# Patient Record
Sex: Female | Born: 1945
Health system: Southern US, Community
[De-identification: ages and names within clinical notes are randomized; demographics above are authoritative.]

## PROBLEM LIST (undated history)

## (undated) DIAGNOSIS — C760 Malignant neoplasm of head, face and neck: Secondary | ICD-10-CM

## (undated) DIAGNOSIS — C801 Malignant (primary) neoplasm, unspecified: Secondary | ICD-10-CM

## (undated) DIAGNOSIS — I639 Cerebral infarction, unspecified: Secondary | ICD-10-CM

## (undated) DIAGNOSIS — Z9221 Personal history of antineoplastic chemotherapy: Secondary | ICD-10-CM

## (undated) DIAGNOSIS — I341 Nonrheumatic mitral (valve) prolapse: Secondary | ICD-10-CM

## (undated) DIAGNOSIS — J189 Pneumonia, unspecified organism: Secondary | ICD-10-CM

## (undated) DIAGNOSIS — I890 Lymphedema, not elsewhere classified: Secondary | ICD-10-CM

## (undated) DIAGNOSIS — K219 Gastro-esophageal reflux disease without esophagitis: Secondary | ICD-10-CM

## (undated) DIAGNOSIS — R569 Unspecified convulsions: Secondary | ICD-10-CM

## (undated) DIAGNOSIS — M1990 Unspecified osteoarthritis, unspecified site: Secondary | ICD-10-CM

## (undated) DIAGNOSIS — Z8719 Personal history of other diseases of the digestive system: Secondary | ICD-10-CM

## (undated) DIAGNOSIS — M545 Low back pain, unspecified: Secondary | ICD-10-CM

## (undated) DIAGNOSIS — I10 Essential (primary) hypertension: Secondary | ICD-10-CM

## (undated) DIAGNOSIS — C4492 Squamous cell carcinoma of skin, unspecified: Secondary | ICD-10-CM

## (undated) DIAGNOSIS — J449 Chronic obstructive pulmonary disease, unspecified: Secondary | ICD-10-CM

## (undated) DIAGNOSIS — E7800 Pure hypercholesterolemia, unspecified: Secondary | ICD-10-CM

## (undated) DIAGNOSIS — IMO0001 Reserved for inherently not codable concepts without codable children: Secondary | ICD-10-CM

## (undated) DIAGNOSIS — F419 Anxiety disorder, unspecified: Secondary | ICD-10-CM

## (undated) DIAGNOSIS — Z9289 Personal history of other medical treatment: Secondary | ICD-10-CM

## (undated) DIAGNOSIS — F329 Major depressive disorder, single episode, unspecified: Secondary | ICD-10-CM

## (undated) DIAGNOSIS — G8929 Other chronic pain: Secondary | ICD-10-CM

## (undated) DIAGNOSIS — C569 Malignant neoplasm of unspecified ovary: Secondary | ICD-10-CM

## (undated) DIAGNOSIS — R011 Cardiac murmur, unspecified: Secondary | ICD-10-CM

## (undated) DIAGNOSIS — F32A Depression, unspecified: Secondary | ICD-10-CM

## (undated) HISTORY — PX: BACK SURGERY: SHX140

## (undated) HISTORY — PX: CATARACT EXTRACTION W/ INTRAOCULAR LENS  IMPLANT, BILATERAL: SHX1307

## (undated) HISTORY — PX: APPENDECTOMY: SHX54

## (undated) HISTORY — PX: BASAL CELL CARCINOMA EXCISION: SHX1214

## (undated) HISTORY — PX: FOOT TENDON SURGERY: SHX958

## (undated) HISTORY — PX: TUBAL LIGATION: SHX77

## (undated) HISTORY — PX: SQUAMOUS CELL CARCINOMA EXCISION: SHX2433

## (undated) HISTORY — PX: FRACTURE SURGERY: SHX138

---

## 1977-05-11 HISTORY — PX: VAGINAL HYSTERECTOMY: SUR661

## 2000-07-12 ENCOUNTER — Encounter: Admission: RE | Admit: 2000-07-12 | Discharge: 2000-07-12 | Payer: Self-pay | Admitting: Specialist

## 2000-07-12 ENCOUNTER — Encounter: Payer: Self-pay | Admitting: Specialist

## 2000-07-20 ENCOUNTER — Encounter: Payer: Self-pay | Admitting: Specialist

## 2000-07-20 ENCOUNTER — Encounter: Admission: RE | Admit: 2000-07-20 | Discharge: 2000-07-20 | Payer: Self-pay | Admitting: Specialist

## 2000-09-21 ENCOUNTER — Encounter: Payer: Self-pay | Admitting: Family Medicine

## 2000-09-21 ENCOUNTER — Encounter: Admission: RE | Admit: 2000-09-21 | Discharge: 2000-09-21 | Payer: Self-pay | Admitting: Family Medicine

## 2001-04-08 ENCOUNTER — Encounter: Payer: Self-pay | Admitting: Specialist

## 2001-04-08 ENCOUNTER — Encounter: Admission: RE | Admit: 2001-04-08 | Discharge: 2001-04-08 | Payer: Self-pay | Admitting: Specialist

## 2001-04-11 ENCOUNTER — Encounter: Payer: Self-pay | Admitting: Specialist

## 2001-04-11 ENCOUNTER — Encounter: Admission: RE | Admit: 2001-04-11 | Discharge: 2001-04-11 | Payer: Self-pay | Admitting: Specialist

## 2001-10-13 ENCOUNTER — Encounter: Payer: Self-pay | Admitting: Family Medicine

## 2001-10-13 ENCOUNTER — Encounter: Admission: RE | Admit: 2001-10-13 | Discharge: 2001-10-13 | Payer: Self-pay | Admitting: Family Medicine

## 2002-10-18 ENCOUNTER — Encounter: Payer: Self-pay | Admitting: Obstetrics and Gynecology

## 2002-10-18 ENCOUNTER — Encounter: Admission: RE | Admit: 2002-10-18 | Discharge: 2002-10-18 | Payer: Self-pay | Admitting: Obstetrics and Gynecology

## 2003-02-12 ENCOUNTER — Ambulatory Visit (HOSPITAL_COMMUNITY): Admission: RE | Admit: 2003-02-12 | Discharge: 2003-02-12 | Payer: Self-pay | Admitting: Gastroenterology

## 2003-04-25 ENCOUNTER — Encounter: Admission: RE | Admit: 2003-04-25 | Discharge: 2003-04-25 | Payer: Self-pay | Admitting: Family Medicine

## 2003-11-28 ENCOUNTER — Encounter: Admission: RE | Admit: 2003-11-28 | Discharge: 2003-11-28 | Payer: Self-pay | Admitting: Family Medicine

## 2004-05-11 HISTORY — PX: CARDIAC CATHETERIZATION: SHX172

## 2005-02-11 ENCOUNTER — Encounter: Admission: RE | Admit: 2005-02-11 | Discharge: 2005-02-11 | Payer: Self-pay | Admitting: Obstetrics and Gynecology

## 2005-03-26 ENCOUNTER — Ambulatory Visit (HOSPITAL_COMMUNITY): Admission: RE | Admit: 2005-03-26 | Discharge: 2005-03-26 | Payer: Self-pay | Admitting: Cardiology

## 2005-04-08 ENCOUNTER — Ambulatory Visit (HOSPITAL_COMMUNITY): Admission: RE | Admit: 2005-04-08 | Discharge: 2005-04-08 | Payer: Self-pay | Admitting: Cardiology

## 2005-05-13 ENCOUNTER — Ambulatory Visit (HOSPITAL_COMMUNITY): Admission: RE | Admit: 2005-05-13 | Discharge: 2005-05-13 | Payer: Self-pay | Admitting: Cardiology

## 2005-09-18 ENCOUNTER — Emergency Department (HOSPITAL_COMMUNITY): Admission: EM | Admit: 2005-09-18 | Discharge: 2005-09-19 | Payer: Self-pay | Admitting: Emergency Medicine

## 2006-03-30 ENCOUNTER — Encounter: Admission: RE | Admit: 2006-03-30 | Discharge: 2006-03-30 | Payer: Self-pay | Admitting: Family Medicine

## 2007-05-19 ENCOUNTER — Encounter: Admission: RE | Admit: 2007-05-19 | Discharge: 2007-05-19 | Payer: Self-pay | Admitting: Family Medicine

## 2008-05-11 HISTORY — PX: PORTACATH PLACEMENT: SHX2246

## 2008-05-11 HISTORY — PX: LAPAROTOMY FOR STAGING / RESTAGING: SUR810

## 2008-05-21 ENCOUNTER — Encounter: Admission: RE | Admit: 2008-05-21 | Discharge: 2008-05-21 | Payer: Self-pay | Admitting: Family Medicine

## 2008-06-08 ENCOUNTER — Encounter: Admission: RE | Admit: 2008-06-08 | Discharge: 2008-06-08 | Payer: Self-pay | Admitting: Family Medicine

## 2009-05-11 DIAGNOSIS — C569 Malignant neoplasm of unspecified ovary: Secondary | ICD-10-CM

## 2009-05-11 HISTORY — DX: Malignant neoplasm of unspecified ovary: C56.9

## 2009-05-28 ENCOUNTER — Encounter: Admission: RE | Admit: 2009-05-28 | Discharge: 2009-05-28 | Payer: Self-pay | Admitting: Family Medicine

## 2009-07-23 ENCOUNTER — Encounter: Admission: RE | Admit: 2009-07-23 | Discharge: 2009-07-23 | Payer: Self-pay | Admitting: Family Medicine

## 2009-08-03 ENCOUNTER — Emergency Department (HOSPITAL_COMMUNITY): Admission: EM | Admit: 2009-08-03 | Discharge: 2009-08-03 | Payer: Self-pay | Admitting: Emergency Medicine

## 2010-05-11 HISTORY — PX: ORIF SHOULDER FRACTURE: SHX5035

## 2010-06-23 ENCOUNTER — Other Ambulatory Visit: Payer: Self-pay | Admitting: Family Medicine

## 2010-06-23 DIAGNOSIS — Z1231 Encounter for screening mammogram for malignant neoplasm of breast: Secondary | ICD-10-CM

## 2010-07-10 ENCOUNTER — Emergency Department (HOSPITAL_COMMUNITY): Payer: 59

## 2010-07-10 ENCOUNTER — Observation Stay (HOSPITAL_COMMUNITY)
Admission: EM | Admit: 2010-07-10 | Discharge: 2010-07-11 | Disposition: A | Discharge status: Home / Self Care | Payer: 59 | Attending: Orthopedic Surgery | Admitting: Orthopedic Surgery

## 2010-07-10 DIAGNOSIS — C569 Malignant neoplasm of unspecified ovary: Secondary | ICD-10-CM | POA: Insufficient documentation

## 2010-07-10 DIAGNOSIS — S42213A Unspecified displaced fracture of surgical neck of unspecified humerus, initial encounter for closed fracture: Principal | ICD-10-CM | POA: Insufficient documentation

## 2010-07-10 DIAGNOSIS — Y9301 Activity, walking, marching and hiking: Secondary | ICD-10-CM | POA: Insufficient documentation

## 2010-07-10 DIAGNOSIS — W010XXA Fall on same level from slipping, tripping and stumbling without subsequent striking against object, initial encounter: Secondary | ICD-10-CM | POA: Insufficient documentation

## 2010-07-10 DIAGNOSIS — I890 Lymphedema, not elsewhere classified: Secondary | ICD-10-CM | POA: Insufficient documentation

## 2010-07-10 DIAGNOSIS — Z79899 Other long term (current) drug therapy: Secondary | ICD-10-CM | POA: Insufficient documentation

## 2010-07-10 DIAGNOSIS — Y92009 Unspecified place in unspecified non-institutional (private) residence as the place of occurrence of the external cause: Secondary | ICD-10-CM | POA: Insufficient documentation

## 2010-07-10 LAB — CBC
HCT: 33.4 % — ABNORMAL LOW (ref 36.0–46.0)
MCH: 26.4 pg (ref 26.0–34.0)
MCHC: 31.7 g/dL (ref 30.0–36.0)
MCV: 83.1 fL (ref 78.0–100.0)
RBC: 4.02 MIL/uL (ref 3.87–5.11)
WBC: 6.3 10*3/uL (ref 4.0–10.5)

## 2010-07-10 LAB — DIFFERENTIAL
Eosinophils Relative: 1 % (ref 0–5)
Lymphs Abs: 2.3 10*3/uL (ref 0.7–4.0)
Monocytes Absolute: 0.6 10*3/uL (ref 0.1–1.0)
Monocytes Relative: 10 % (ref 3–12)
Neutrophils Relative %: 52 % (ref 43–77)

## 2010-07-10 LAB — COMPREHENSIVE METABOLIC PANEL
AST: 18 U/L (ref 0–37)
Albumin: 3.1 g/dL — ABNORMAL LOW (ref 3.5–5.2)
Calcium: 8.4 mg/dL (ref 8.4–10.5)
Chloride: 108 mEq/L (ref 96–112)
Creatinine, Ser: 0.83 mg/dL (ref 0.4–1.2)
GFR calc Af Amer: >60 mL/min (ref >60)
Glucose, Bld: 112 mg/dL — ABNORMAL HIGH (ref 70–99)
Sodium: 140 mEq/L (ref 135–145)
Total Bilirubin: 0.6 mg/dL (ref 0.3–1.2)
Total Protein: 6.6 g/dL (ref 6.0–8.3)

## 2010-07-10 LAB — URINALYSIS, ROUTINE W REFLEX MICROSCOPIC
Bilirubin Urine: NEGATIVE
Hgb urine dipstick: NEGATIVE
Specific Gravity, Urine: 1.033 — ABNORMAL HIGH (ref 1.005–1.030)
Urine Glucose, Fasting: NEGATIVE mg/dL
Urobilinogen, UA: 0.2 mg/dL (ref 0.0–1.0)

## 2010-07-10 LAB — PROTIME-INR: INR: 1.02 (ref 0.00–1.49)

## 2010-07-11 LAB — URINE CULTURE: Colony Count: 9000

## 2010-07-14 NOTE — H&P (Signed)
  NAME:  Price, Caitlyn              ACCOUNT NO.:  [id number]  MEDICAL RECORD NO.:  [id number]           PATIENT TYPE:  I  LOCATION:  1605                         FACILITY:  [facility]  PHYSICIAN:  Alvy Beal, MD    DATE OF BIRTH:  August 20, 1945  DATE OF ADMISSION:  07/10/2010 DATE OF DISCHARGE:                             HISTORY & PHYSICAL   ADMISSION DIAGNOSIS:  Right proximal four-part intertrochanteric fracture.  HISTORY:  This is a very pleasant 65 year old woman with an underlying history of ovarian cancer, who was still undergoing treatment with chemotherapy.  She has significant lymphedema secondary to ovarian cancer in the left lower extremity and she has been falling recently. The patient says she with her dog, tripped and fell, and landed on her right shoulder.  She is well-known to my practice and so I was contacted for consultation.  The patient states that initially she had significant numbness in her hand, but that is resolved and she still has a lot of pain in the right shoulder.  No other complaints.  She denies a loss of consciousness, headaches, blurry vision, or nausea.  Her past medical, surgical, family, social history includes ovarian cancer, mitral valve prolapse.  She is a nondrug abuser, nonsmoker, nondrinker.  No significant medical history.  She is on Cymbalta, vitamin B12, aspirin, lorazepam, Ambien, Avelox.  She is allergic to COMPAZINE, PENICILLIN, PHENERGAN, SULFA.  CLINICAL EXAM:  She is a pleasant woman who appears stated age in no acute distress.  She is alert, oriented x3.  No shortness of breath or chest pain.  Abdomen is soft and nontender.  Port-a-Cath site is clean, dry, and intact.  No real hip, knee, or ankle pain with joint range of motion.  Right upper extremity has significant shoulder pain with palpation.  No obvious laceration or contusion or bleeding.  Posterior interosseous nerve, median and ulnar nerve functions are all  intact. She has intact sensation to light touch in the axillary nerve distribution.  X-rays demonstrated four-part comminuted proximal humerus fracture with displacement of the greater troch.  At this point in time, I have spoken with the patient and because of severe pain, I will admit her to the hospital for pain control.  We will use PCA Dilaudid tonight and then transition her to oral narcotics in the morning.  I will contact my partner for definitive fracture management.  We will put her in a shoulder immobilizer for now and also get a CT scan for better fracture evaluation.    Alvy Beal, MD    DDB/MEDQ  D:  07/10/2010  T:  07/11/2010  Job:  [id number]  Electronically Signed by Venita Lick MD on 07/14/2010 05:21:24 PM

## 2010-07-17 ENCOUNTER — Inpatient Hospital Stay (HOSPITAL_COMMUNITY)
Admission: RE | Admit: 2010-07-17 | Discharge: 2010-07-21 | DRG: 494 | Disposition: A | Discharge status: Home / Self Care | Payer: 59 | Source: Ambulatory Visit | Attending: Orthopedic Surgery | Admitting: Orthopedic Surgery

## 2010-07-17 ENCOUNTER — Encounter (HOSPITAL_COMMUNITY)
Admission: RE | Admit: 2010-07-17 | Discharge: 2010-07-17 | Disposition: A | Discharge status: Home / Self Care | Payer: 59 | Source: Ambulatory Visit | Attending: Orthopedic Surgery | Admitting: Orthopedic Surgery

## 2010-07-17 ENCOUNTER — Other Ambulatory Visit (HOSPITAL_COMMUNITY): Payer: 59

## 2010-07-17 ENCOUNTER — Other Ambulatory Visit (HOSPITAL_COMMUNITY): Payer: Self-pay | Admitting: Orthopedic Surgery

## 2010-07-17 ENCOUNTER — Ambulatory Visit: Payer: Self-pay

## 2010-07-17 DIAGNOSIS — Y998 Other external cause status: Secondary | ICD-10-CM

## 2010-07-17 DIAGNOSIS — R296 Repeated falls: Secondary | ICD-10-CM | POA: Diagnosis present

## 2010-07-17 DIAGNOSIS — S42253A Displaced fracture of greater tuberosity of unspecified humerus, initial encounter for closed fracture: Principal | ICD-10-CM | POA: Diagnosis present

## 2010-07-17 DIAGNOSIS — S4291XA Fracture of right shoulder girdle, part unspecified, initial encounter for closed fracture: Secondary | ICD-10-CM

## 2010-07-17 LAB — BASIC METABOLIC PANEL
BUN: 8 mg/dL (ref 6–23)
GFR calc Af Amer: >60 mL/min (ref >60)
GFR calc non Af Amer: >60 mL/min (ref >60)
Glucose, Bld: 97 mg/dL (ref 70–99)
Potassium: 4 mEq/L (ref 3.5–5.1)
Sodium: 136 mEq/L (ref 135–145)

## 2010-07-17 LAB — DIFFERENTIAL
Basophils Absolute: 0 10*3/uL (ref 0.0–0.1)
Basophils Relative: 1 % (ref 0–1)
Eosinophils Absolute: 0.1 10*3/uL (ref 0.0–0.7)
Eosinophils Relative: 2 % (ref 0–5)
Lymphs Abs: 2.3 10*3/uL (ref 0.7–4.0)
Monocytes Absolute: 0.7 10*3/uL (ref 0.1–1.0)
Monocytes Relative: 11 % (ref 3–12)
Neutro Abs: 3 10*3/uL (ref 1.7–7.7)

## 2010-07-17 LAB — URINALYSIS, ROUTINE W REFLEX MICROSCOPIC
Glucose, UA: NEGATIVE mg/dL
Hgb urine dipstick: NEGATIVE
Nitrite: NEGATIVE
Protein, ur: NEGATIVE mg/dL
Specific Gravity, Urine: 1.018 (ref 1.005–1.030)
pH: 5.5 (ref 5.0–8.0)

## 2010-07-17 LAB — CBC
Hemoglobin: 9.8 g/dL — ABNORMAL LOW (ref 12.0–15.0)
Platelets: 368 10*3/uL (ref 150–400)
RBC: 3.81 MIL/uL — ABNORMAL LOW (ref 3.87–5.11)
WBC: 6.1 10*3/uL (ref 4.0–10.5)

## 2010-07-17 LAB — SURGICAL PCR SCREEN: Staphylococcus aureus: NEGATIVE

## 2010-07-17 LAB — APTT: aPTT: 28 seconds (ref 24–37)

## 2010-07-19 LAB — CBC
Hemoglobin: 8.8 g/dL — ABNORMAL LOW (ref 12.0–15.0)
MCH: 26.3 pg (ref 26.0–34.0)
MCHC: 32.7 g/dL (ref 30.0–36.0)
MCV: 80.5 fL (ref 78.0–100.0)
RBC: 3.34 MIL/uL — ABNORMAL LOW (ref 3.87–5.11)

## 2010-07-19 LAB — BASIC METABOLIC PANEL
BUN: 12 mg/dL (ref 6–23)
CO2: 28 mEq/L (ref 19–32)
Calcium: 8.6 mg/dL (ref 8.4–10.5)
Chloride: 103 mEq/L (ref 96–112)
Creatinine, Ser: 0.76 mg/dL (ref 0.4–1.2)
GFR calc Af Amer: >60 mL/min (ref >60)
Glucose, Bld: 138 mg/dL — ABNORMAL HIGH (ref 70–99)

## 2010-07-20 LAB — CBC
MCH: 25.9 pg — ABNORMAL LOW (ref 26.0–34.0)
MCHC: 31.7 g/dL (ref 30.0–36.0)
MCV: 81.6 fL (ref 78.0–100.0)
Platelets: 348 10*3/uL (ref 150–400)
RBC: 3.09 MIL/uL — ABNORMAL LOW (ref 3.87–5.11)
RDW: 18.2 % — ABNORMAL HIGH (ref 11.5–15.5)

## 2010-07-20 LAB — BASIC METABOLIC PANEL
BUN: 10 mg/dL (ref 6–23)
Calcium: 8.4 mg/dL (ref 8.4–10.5)
Chloride: 104 mEq/L (ref 96–112)
Creatinine, Ser: 0.79 mg/dL (ref 0.4–1.2)
GFR calc Af Amer: >60 mL/min (ref >60)
GFR calc non Af Amer: >60 mL/min (ref >60)

## 2010-07-26 NOTE — Op Note (Signed)
NAME:  [name], [name]              ACCOUNT NO.:  [id number]  MEDICAL RECORD NO.:  [id number]           PATIENT TYPE:  LOCATION:                                 FACILITY:  PHYSICIAN:  Almedia Balls. Ranell Patrick, M.D. DATE OF BIRTH:  [birth year]  DATE OF PROCEDURE:  07/18/2010 DATE OF DISCHARGE:                              OPERATIVE REPORT   PREOPERATIVE DIAGNOSIS:  Right displaced proximal humerus fracture.  POSTOPERATIVE DIAGNOSIS:  Right displaced proximal humerus fracture.  PROCEDURE PERFORMED:  ORIF right proximal humerus fracture with repair of greater tuberosity.  ATTENDING SURGEON:  Almedia Balls. Ranell Patrick, MD  ASSISTANT:  Modesto Charon, [place].  ANESTHESIA:  General anesthesia was used plus interscalene block.  ESTIMATED BLOOD LOSS:  Minimal.  FLUID REPLACEMENT:  1200 mL crystalloid.  INSTRUMENT COUNTS:  Correct.  COMPLICATIONS:  None.  Perioperative antibiotics were given.  INDICATIONS:  The patient is a 65 year old female with a fall and injury to her right proximal humerus.  The patient sustained a displaced greater tuberosity fracture with a valgus impacted humeral head fracture.  The patient had grossly distorted anatomy in proximal humerus and with a displaced nerve greater tuberosity, was felt to be an operative candidate.  I discussed this in detail with the patient and her husband regarding the recommendation for surgical management to restore the integrity of her greater tuberosity/rotator cuff and also to realign and stabilize her humeral head.  Informed consent was obtained.  DESCRIPTION OF PROCEDURE:  After adequate level of anesthesia achieved, the patient was positioned in modified beach-chair position.  Right shoulder was sterilely prepped and draped in usual manner.  We approached the shoulder through a deltopectoral incision starting at the coracoid process extending down to the anterior humeral shaft. Dissection down through subcutaneous tissues.  The  cephalic vein was identified and taken laterally to the deltoid and pectoralis was taken medially.  Immediately encountered the fractured humerus.  The greater tuberosity was displaced significantly posteriorly with abduction to relax the deltoid.  I was able to identify and grab that greater tuberosity and its attached rotator cuff.  We placed #2 FiberWire suture lateral to the greater tuberosity into the substance of the rotator cuff tendon.  Once we had three of those horizontal mattress sutures with good purchase in the tendon just adjacent to the greater tuberosity and we could mobilize that back into position, we went ahead and used a rongeur to remove a little bit bone from the lateral humeral shaft in preparation for the DePuy SNP nail plate.  We placed that nail plate into position.  We reduced the humeral head regaining the humeral inclination and that was done with a Cobb elevator and then I was able to go and fire guide pin into the center of the humeral head.  This was verified with multiplanar C-arm which was draped into the field.  We then went ahead and placed our non threaded pegs which were locked into the SNP plate and we had a total of 5 pegs with good purchase and good alignment.  We were happy in all planes with the reduction and the alignment and placement  of the pins.  Once this was secure, we had a nice stable construct.  We then went ahead and placed 2 distal unicortical screws in the SNP plate without complication.  The final construct was extremely stable.  We then brought the greater tuberosity back into anatomic alignment.  There was a little bit of the tear up into the rotator cuff which we repaired side-to-side using #2 FiberWire suture and that was right where the greater tuberosity had fractured away.  It was almost like that cuff was starting to peel away from the rotator interval area, so we oversewed that rotator interval area and then anatomically  placed that greater tuberosity.  We used our sutures from the rotator cuff and then sewed those over the top to the subscapularis medial to lesser tuberosity.  We had nice solid soft tissue repair, ranges shoulder fully with no impingement and got some final x-rays showing actually over reduction of the greater tuberosity but still was acceptable and everything moved together in stable unit. At this point, we thoroughly irrigated the subdeltoid interval.  We closed the deltopectoral interval with 0 Vicryl suture followed by 2-0 Vicryl and subcutaneous closure and 4-0 Monocryl skin.  Steri-Strips applied followed by a sterile dressing.  The patient tolerated surgery well.     Almedia Balls. Ranell Patrick, M.D.     SRN/MEDQ  D:  07/18/2010  T:  07/19/2010  Job:  [id number]  Electronically Signed by Malon Kindle  on 07/26/2010 11:22:50 AM

## 2010-08-04 LAB — CBC
Hemoglobin: 9.8 g/dL — ABNORMAL LOW (ref 12.0–15.0)
MCHC: 32.1 g/dL (ref 30.0–36.0)
RBC: 3.53 MIL/uL — ABNORMAL LOW (ref 3.87–5.11)
RDW: 14.3 % (ref 11.5–15.5)

## 2010-08-04 LAB — DIFFERENTIAL
Basophils Absolute: 0 10*3/uL (ref 0.0–0.1)
Basophils Relative: 1 % (ref 0–1)
Lymphocytes Relative: 26 % (ref 12–46)
Monocytes Relative: 8 % (ref 3–12)
Neutro Abs: 3.8 10*3/uL (ref 1.7–7.7)
Neutrophils Relative %: 64 % (ref 43–77)

## 2010-08-04 LAB — BASIC METABOLIC PANEL
CO2: 30 mEq/L (ref 19–32)
Calcium: 8.9 mg/dL (ref 8.4–10.5)
Creatinine, Ser: 0.84 mg/dL (ref 0.4–1.2)
GFR calc Af Amer: >60 mL/min (ref >60)
GFR calc non Af Amer: >60 mL/min (ref >60)
Sodium: 137 mEq/L (ref 135–145)

## 2010-08-12 NOTE — Discharge Summary (Signed)
  NAME:  [name], [name]              ACCOUNT NO.:  [id number]  MEDICAL RECORD NO.:  [id number]           PATIENT TYPE:  I  LOCATION:  1605                         FACILITY:  [facility]  PHYSICIAN:  Alvy Beal, MD    DATE OF BIRTH:  [birth year]  DATE OF ADMISSION:  07/10/2010 DATE OF DISCHARGE:  07/11/2010                              DISCHARGE SUMMARY   ADMITTING DIAGNOSIS:  Right four-part proximal humerus fracture.  HISTORY:  This is a 65 year old female who fell and was brought to the emergency room.  The patient states that she was walking her dog, tripped and fell, and landed on her right shoulder.  As a result, she had significant pain and difficulty moving.  The patient was brought to the emergency room.  X-rays were done which demonstrated a comminuted four-part proximal humerus fracture.  As a result, Orthopedic consultation was requested.  Please refer to the dictated H and P for specifics on her past medical, surgical, family, and social history.  The patient was admitted and placed on the shoulder immobilizer.  She was admitted for pain control.  A CT scan was done to get better evaluation of the shoulder.  There was no dislocation.  At this point in time, the decision was made since her pain was improving and she was neurovascularly intact, to discharge her home.  She will follow up with my partner, Dr. Ranell Patrick, for definitive fracture management of the right proximal humerus fracture.  Appropriate instructions and pain medications were provided to the patient prior to being discharged.  FINAL DIAGNOSIS:  Four-part proximal humerus fracture, right side.     Alvy Beal, MD     DDB/MEDQ  D:  08/04/2010  T:  08/05/2010  Job:  [id number]  Electronically Signed by Venita Lick MD on 08/12/2010 08:42:02 AM

## 2010-08-13 NOTE — Discharge Summary (Signed)
  NAME:  [name], [name]              ACCOUNT NO.:  [id number]  MEDICAL RECORD NO.:  [id number]           PATIENT TYPE:  I  LOCATION:  5009                         FACILITY:  [facility]  PHYSICIAN:  Almedia Balls. Ranell Patrick, M.D. DATE OF BIRTH:  April 18, 1946  DATE OF ADMISSION:  07/18/2010 DATE OF DISCHARGE:  07/21/2010                              DISCHARGE SUMMARY   ADMISSION DIAGNOSIS:  Right proximal humerus fracture with displacement.  DISCHARGE DIAGNOSIS:  Right proximal humerus fracture status post open reduction and internal fixation.  BRIEF HISTORY:  The patient is a 65 year old female who sustained a ground-level fall, injuring her right shoulder, proximal humerus.  The patient elected for surgery to decrease pain and increase function of the right upper extremity.  PROCEDURE:  The patient had a right proximal humerus ORIF by Dr. Malon Kindle on July 18, 2010.  ASSISTANT:  Donnie Coffin. Dixon, PA  General anesthesia was used.  No complications.  HOSPITAL COURSE:  The patient underwent, on July 18, 2010, the above- stated procedure which she tolerated well.  After adequate time in postanesthesia care unit, she was transferred to 5000 on postop day #1 and #2, and she complained of severe pain to that right shoulder.  She continued to remain on Percocet and Dilaudid for pain control. Neurologically, she is intact.  Wound was healing well.  She was feeling much better on postop day #3, hence the patient discharged home with aid of family support.  Labs were in acceptable limits.  Neurologically, she was intact.  Wound was healing well.  DISCHARGE PLAN:  The patient will be discharged home on July 21, 2010. Condition is stable.  Her diet is regular.  The patient has allergies to PENICILLIN, SULFA, COMPAZINE, MORPHINE, PHENERGAN, and COLACE.  DISCHARGE MEDICATIONS: 1. Aspirin 81 mg daily. 2. Cymbalta 60 mg daily. 3. Robaxin 500 mg p.o. q.6 hours. 4. Protonix 40 mg daily. 5.  Percocet 5/325, 1-2 tabs every 4-6 hours p.r.n. pain. 6. Dilaudid 10 mg p.o. q.4 hours p.r.n. breakthrough pain.  FOLLOWUP:  The patient to follow back up with Dr. Malon Kindle in 2 weeks.     Thomas B. Dixon, P.A.   ______________________________ Almedia Balls. Ranell Patrick, M.D.    TBD/MEDQ  D:  07/21/2010  T:  07/22/2010  Job:  [id number]  Electronically Signed by Standley Dakins P.A. on 08/05/2010 08:15:06 AM Electronically Signed by Malon Kindle  on 08/13/2010 08:07:44 PM

## 2010-09-26 NOTE — Cardiovascular Report (Signed)
NAME:  [name], [name]              ACCOUNT NO.:  [id number]   MEDICAL RECORD NO.:  [id number]          PATIENT TYPE:  OIB   LOCATION:  2855                         FACILITY:  [facility]   PHYSICIAN:  Armanda Magic, M.D.     DATE OF BIRTH:  Aug 05, 1945   DATE OF PROCEDURE:  03/26/2005  DATE OF DISCHARGE:  03/26/2005                              CARDIAC CATHETERIZATION   REFERRING PHYSICIAN:  Dr. Holley Bouche.   PROCEDURE:  Left heart catheterization, coronary angiography, left  ventriculography.   OPERATOR:  Armanda Magic, M.D.   INDICATIONS:  Chest pain and shortness of breath.   COMPLICATIONS:  None.   IV CONTRAST USED:  70 cc.   IV MEDICATIONS:  Versed 1 milligram IV.   This is a very pleasant 65 year old white female, with a history of  dyslipidemia, mitral prolapse and depression, who presented with new onset  of exertional shortness of breath as well as exertional fatigue and chest  pain. She now presents for cardiac catheterization.   The patient is brought to the cardiac catheterization laboratory in the  fasting nonsedated state. Informed consent was obtained. The patient was  connected to continuous heart rate, pulse oximetry monitoring and  intermittent blood pressure monitoring. The right groin was prepped and  draped in a sterile fashion. 1% Xylocaine was used for local anesthesia.  Using a modified Seldinger technique, a 6-French sheath was placed in the  right femoral artery. Under fluoroscopic guidance, a 6-French JL-4 catheter  was placed in the left coronary artery. Multiple cine films were taken at 33  degree RAO and 40 degree LAO views. This catheter was then exchanged out  over a guidewire for 6-French JR-4 catheter which was placed under  fluoroscopic guidance in the right coronary artery. Multiple cine films were  taken at 30 degree RAO and 40 degree LAO views. This catheter was then  exchanged out over guidewire for 6-French angled pigtail catheter which  was  placed under fluoroscopic guidance in the left ventricular cavity. Left  ventriculography was performed in the 30 degree RAO view using a total of 30  cc contrast at 15 cc per second. Catheter was then pulled back across the  aortic valve with no significant gradient noted. At the end of the  procedure, all catheters and sheaths were removed. Manual compression was  performed until adequate hemostasis was obtained. The patient was  transferred back to the room in stable condition.   RESULTS:  Left main coronary artery is widely patent and bifurcates into the  left anterior descending artery and left circumflex artery. Left anterior  descending artery is widely patent throughout its course giving rise to  three diagonal branches all of which are widely patent.   Left circumflex is widely patent throughout its course giving rise to one  very large obtuse marginal branch which is widely patent. The ongoing  circumflex traverses the AV groove and is widely patent.   The right coronary artery is widely patent and bifurcates distally in the  posterior descending artery and the posterior lateral artery, both of which  are widely patent.  Left ventriculography shows normal LV systolic function EF of 50%. LV  pressure 122/10 mm. Aortic pressure 123/58 mmHg, LVEDP 10-13 mmHg.   ASSESSMENT:  1.  Noncardiac chest pain and shortness of breath.  2.  Normal left ventricular function with normal left ventricular end-      diastolic pressure.  3.  Normal coronary arteries.  4.  Biapical and left lower lobe lung scarring on chest x-ray.  5.  Dyslipidemia.   PLAN:  1.  Discharge home after IV fluid and bed rest. Outpatient chest CT for      workup of shortness of breath and rule out PE as well as evaluate      further the lung scarring.  2.  Check PFTs with [facility].  3.  Follow up with me in one to weeks.      Armanda Magic, M.D.  Electronically Signed     TT/MEDQ  D:  03/26/2005  T:   03/27/2005  Job:  [id number]   cc:   Holley Bouche, M.D.  Fax: [phone]

## 2010-09-26 NOTE — Op Note (Signed)
   NAME:  [name], Caitlyn B                        ACCOUNT NO.:  [id number]   MEDICAL RECORD NO.:  [id number]                   PATIENT TYPE:  AMB   LOCATION:  ENDO                                 [facility]ACILITY[facility]   PHYSICIAN:  Danise Edge, M.D.                DATE OF BIRTH:  June 29, 1945   DATE OF PROCEDURE:  02/12/2003  DATE OF DISCHARGE:                                 OPERATIVE REPORT   PROCEDURE:  Screening colonoscopy.   REFERRING PHYSICIANS:  Artist Pais, M.D., and Holley Bouche, M.D.   PROCEDURE INDICATION:  [name] is a 65 year old female born November 06, 1945.  Ms. Stipes is scheduled to undergo her first screening colonoscopy  with polypectomy to prevent colon cancer.   ENDOSCOPIST:  Danise Edge, M.D.   PREMEDICATION:  Versed 7 mg, Demerol 50 mg.   DESCRIPTION OF PROCEDURE:  After obtaining informed consent, [name] was  placed in the left lateral decubitus position.  I administered intravenous  Demerol and intravenous Versed to achieve conscious sedation for the  procedure.  The patient's blood pressure, oxygen saturation, and cardiac  rhythm were monitored throughout the procedure and documented in the medical  record.   Anal inspection was normal.  Digital rectal exam was normal.  The Olympus  pediatric colonoscope was introduced into the rectum and easily advanced to  the cecum.  Colonic preparation for the exam today was excellent.   Rectum normal.   Sigmoid colon and descending colon normal.   Splenic flexure normal.   Transverse colon normal.   Hepatic flexure normal.   Ascending colon normal.   Cecum and ileocecal valve normal.    ASSESSMENT:  1. Normal screening proctocolonoscopy to the cecum.  No endoscopic evidence     for the presence of colorectal neoplasia.  2. Melanosis coli present.                                               Danise Edge, M.D.    MJ/MEDQ  D:  02/12/2003  T:  02/12/2003  Job:  [id number]   cc:    Artist Pais, M.D.  [address], [address]  [place]  [place] [address]  Fax: [phone]   Holley Bouche, M.D.  [address]  [place], [place] [address]  Fax: [phone]

## 2010-10-24 ENCOUNTER — Ambulatory Visit
Admission: RE | Admit: 2010-10-24 | Discharge: 2010-10-24 | Disposition: A | Discharge status: Home / Self Care | Payer: 59 | Source: Ambulatory Visit | Attending: Family Medicine | Admitting: Family Medicine

## 2010-10-24 DIAGNOSIS — Z1231 Encounter for screening mammogram for malignant neoplasm of breast: Secondary | ICD-10-CM

## 2011-07-13 ENCOUNTER — Other Ambulatory Visit: Payer: Self-pay | Admitting: Gastroenterology

## 2011-07-13 DIAGNOSIS — R1011 Right upper quadrant pain: Secondary | ICD-10-CM

## 2011-07-15 ENCOUNTER — Other Ambulatory Visit: Payer: 59

## 2011-07-21 ENCOUNTER — Ambulatory Visit
Admission: RE | Admit: 2011-07-21 | Discharge: 2011-07-21 | Disposition: A | Discharge status: Home / Self Care | Payer: 59 | Source: Ambulatory Visit | Attending: Gastroenterology | Admitting: Gastroenterology

## 2011-07-21 DIAGNOSIS — R1011 Right upper quadrant pain: Secondary | ICD-10-CM

## 2011-09-15 ENCOUNTER — Other Ambulatory Visit: Payer: Self-pay | Admitting: Family Medicine

## 2011-09-15 DIAGNOSIS — Z1231 Encounter for screening mammogram for malignant neoplasm of breast: Secondary | ICD-10-CM

## 2011-10-13 ENCOUNTER — Other Ambulatory Visit: Payer: Self-pay | Admitting: Family Medicine

## 2011-10-13 ENCOUNTER — Ambulatory Visit
Admission: RE | Admit: 2011-10-13 | Discharge: 2011-10-13 | Disposition: A | Discharge status: Home / Self Care | Payer: 59 | Source: Ambulatory Visit | Attending: Family Medicine | Admitting: Family Medicine

## 2011-10-13 DIAGNOSIS — I890 Lymphedema, not elsewhere classified: Secondary | ICD-10-CM

## 2011-10-26 ENCOUNTER — Ambulatory Visit: Payer: 59

## 2011-10-28 ENCOUNTER — Ambulatory Visit
Admission: RE | Admit: 2011-10-28 | Discharge: 2011-10-28 | Disposition: A | Discharge status: Home / Self Care | Payer: 59 | Source: Ambulatory Visit | Attending: Family Medicine | Admitting: Family Medicine

## 2011-10-28 DIAGNOSIS — Z1231 Encounter for screening mammogram for malignant neoplasm of breast: Secondary | ICD-10-CM

## 2012-07-19 DIAGNOSIS — C569 Malignant neoplasm of unspecified ovary: Secondary | ICD-10-CM | POA: Diagnosis not present

## 2012-07-22 DIAGNOSIS — G479 Sleep disorder, unspecified: Secondary | ICD-10-CM | POA: Diagnosis not present

## 2012-07-22 DIAGNOSIS — L0291 Cutaneous abscess, unspecified: Secondary | ICD-10-CM | POA: Diagnosis not present

## 2012-08-03 DIAGNOSIS — R599 Enlarged lymph nodes, unspecified: Secondary | ICD-10-CM | POA: Diagnosis not present

## 2012-08-19 DIAGNOSIS — C569 Malignant neoplasm of unspecified ovary: Secondary | ICD-10-CM | POA: Diagnosis not present

## 2012-08-19 DIAGNOSIS — B009 Herpesviral infection, unspecified: Secondary | ICD-10-CM | POA: Diagnosis not present

## 2012-08-19 DIAGNOSIS — I890 Lymphedema, not elsewhere classified: Secondary | ICD-10-CM | POA: Diagnosis not present

## 2012-08-19 DIAGNOSIS — R599 Enlarged lymph nodes, unspecified: Secondary | ICD-10-CM | POA: Diagnosis not present

## 2012-08-29 DIAGNOSIS — R918 Other nonspecific abnormal finding of lung field: Secondary | ICD-10-CM | POA: Diagnosis not present

## 2012-08-29 DIAGNOSIS — R609 Edema, unspecified: Secondary | ICD-10-CM | POA: Diagnosis not present

## 2012-08-29 DIAGNOSIS — K7689 Other specified diseases of liver: Secondary | ICD-10-CM | POA: Diagnosis not present

## 2012-08-29 DIAGNOSIS — J438 Other emphysema: Secondary | ICD-10-CM | POA: Diagnosis not present

## 2012-08-29 DIAGNOSIS — C569 Malignant neoplasm of unspecified ovary: Secondary | ICD-10-CM | POA: Diagnosis not present

## 2012-08-29 DIAGNOSIS — R599 Enlarged lymph nodes, unspecified: Secondary | ICD-10-CM | POA: Diagnosis not present

## 2012-08-31 DIAGNOSIS — Z9221 Personal history of antineoplastic chemotherapy: Secondary | ICD-10-CM | POA: Diagnosis not present

## 2012-08-31 DIAGNOSIS — I890 Lymphedema, not elsewhere classified: Secondary | ICD-10-CM | POA: Diagnosis not present

## 2012-08-31 DIAGNOSIS — Z90710 Acquired absence of both cervix and uterus: Secondary | ICD-10-CM | POA: Diagnosis not present

## 2012-08-31 DIAGNOSIS — Z79810 Long term (current) use of selective estrogen receptor modulators (SERMs): Secondary | ICD-10-CM | POA: Diagnosis not present

## 2012-08-31 DIAGNOSIS — Z9079 Acquired absence of other genital organ(s): Secondary | ICD-10-CM | POA: Diagnosis not present

## 2012-08-31 DIAGNOSIS — Z8543 Personal history of malignant neoplasm of ovary: Secondary | ICD-10-CM | POA: Diagnosis not present

## 2012-08-31 DIAGNOSIS — Z09 Encounter for follow-up examination after completed treatment for conditions other than malignant neoplasm: Secondary | ICD-10-CM | POA: Diagnosis not present

## 2012-10-17 DIAGNOSIS — Z452 Encounter for adjustment and management of vascular access device: Secondary | ICD-10-CM | POA: Diagnosis not present

## 2012-10-17 DIAGNOSIS — Z9079 Acquired absence of other genital organ(s): Secondary | ICD-10-CM | POA: Diagnosis not present

## 2012-10-17 DIAGNOSIS — I890 Lymphedema, not elsewhere classified: Secondary | ICD-10-CM | POA: Diagnosis not present

## 2012-10-17 DIAGNOSIS — Z7982 Long term (current) use of aspirin: Secondary | ICD-10-CM | POA: Diagnosis not present

## 2012-10-17 DIAGNOSIS — C569 Malignant neoplasm of unspecified ovary: Secondary | ICD-10-CM | POA: Diagnosis not present

## 2012-10-17 DIAGNOSIS — Z79899 Other long term (current) drug therapy: Secondary | ICD-10-CM | POA: Diagnosis not present

## 2012-10-17 DIAGNOSIS — Z9221 Personal history of antineoplastic chemotherapy: Secondary | ICD-10-CM | POA: Diagnosis not present

## 2012-10-17 DIAGNOSIS — Z90710 Acquired absence of both cervix and uterus: Secondary | ICD-10-CM | POA: Diagnosis not present

## 2012-10-26 DIAGNOSIS — Z23 Encounter for immunization: Secondary | ICD-10-CM | POA: Diagnosis not present

## 2012-10-26 DIAGNOSIS — F329 Major depressive disorder, single episode, unspecified: Secondary | ICD-10-CM | POA: Diagnosis not present

## 2012-10-26 DIAGNOSIS — R599 Enlarged lymph nodes, unspecified: Secondary | ICD-10-CM | POA: Diagnosis not present

## 2012-10-26 DIAGNOSIS — F411 Generalized anxiety disorder: Secondary | ICD-10-CM | POA: Diagnosis not present

## 2012-10-26 DIAGNOSIS — E785 Hyperlipidemia, unspecified: Secondary | ICD-10-CM | POA: Diagnosis not present

## 2012-10-26 DIAGNOSIS — G479 Sleep disorder, unspecified: Secondary | ICD-10-CM | POA: Diagnosis not present

## 2012-11-24 DIAGNOSIS — C569 Malignant neoplasm of unspecified ovary: Secondary | ICD-10-CM | POA: Diagnosis not present

## 2012-12-02 DIAGNOSIS — H04129 Dry eye syndrome of unspecified lacrimal gland: Secondary | ICD-10-CM | POA: Diagnosis not present

## 2012-12-02 DIAGNOSIS — H40029 Open angle with borderline findings, high risk, unspecified eye: Secondary | ICD-10-CM | POA: Diagnosis not present

## 2012-12-16 ENCOUNTER — Other Ambulatory Visit: Payer: Self-pay

## 2012-12-16 DIAGNOSIS — Z1231 Encounter for screening mammogram for malignant neoplasm of breast: Secondary | ICD-10-CM

## 2012-12-23 DIAGNOSIS — S52599A Other fractures of lower end of unspecified radius, initial encounter for closed fracture: Secondary | ICD-10-CM | POA: Diagnosis not present

## 2013-01-05 ENCOUNTER — Ambulatory Visit
Admission: RE | Admit: 2013-01-05 | Discharge: 2013-01-05 | Disposition: A | Discharge status: Home / Self Care | Payer: Medicare Other | Source: Ambulatory Visit

## 2013-01-05 DIAGNOSIS — Z1231 Encounter for screening mammogram for malignant neoplasm of breast: Secondary | ICD-10-CM

## 2013-01-12 DIAGNOSIS — S52599A Other fractures of lower end of unspecified radius, initial encounter for closed fracture: Secondary | ICD-10-CM | POA: Diagnosis not present

## 2013-01-23 DIAGNOSIS — Z452 Encounter for adjustment and management of vascular access device: Secondary | ICD-10-CM | POA: Diagnosis not present

## 2013-01-23 DIAGNOSIS — C569 Malignant neoplasm of unspecified ovary: Secondary | ICD-10-CM | POA: Diagnosis not present

## 2013-02-06 DIAGNOSIS — S52599A Other fractures of lower end of unspecified radius, initial encounter for closed fracture: Secondary | ICD-10-CM | POA: Diagnosis not present

## 2013-02-13 ENCOUNTER — Other Ambulatory Visit: Payer: Self-pay | Admitting: Gastroenterology

## 2013-02-22 DIAGNOSIS — S52599A Other fractures of lower end of unspecified radius, initial encounter for closed fracture: Secondary | ICD-10-CM | POA: Diagnosis not present

## 2013-03-01 ENCOUNTER — Encounter (HOSPITAL_COMMUNITY): Payer: Self-pay | Admitting: Pharmacy Technician

## 2013-03-10 ENCOUNTER — Encounter (HOSPITAL_COMMUNITY): Payer: Self-pay | Admitting: *Deleted

## 2013-03-10 DIAGNOSIS — I341 Nonrheumatic mitral (valve) prolapse: Secondary | ICD-10-CM

## 2013-03-10 DIAGNOSIS — I890 Lymphedema, not elsewhere classified: Secondary | ICD-10-CM

## 2013-03-10 HISTORY — DX: Nonrheumatic mitral (valve) prolapse: I34.1

## 2013-03-10 HISTORY — DX: Lymphedema, not elsewhere classified: I89.0

## 2013-03-11 ENCOUNTER — Encounter (HOSPITAL_COMMUNITY): Payer: Self-pay | Admitting: Emergency Medicine

## 2013-03-11 ENCOUNTER — Emergency Department (HOSPITAL_COMMUNITY)
Admission: EM | Admit: 2013-03-11 | Discharge: 2013-03-12 | Disposition: A | Discharge status: Home / Self Care | Payer: Medicare Other | Attending: Emergency Medicine | Admitting: Emergency Medicine

## 2013-03-11 DIAGNOSIS — Z7982 Long term (current) use of aspirin: Secondary | ICD-10-CM | POA: Insufficient documentation

## 2013-03-11 DIAGNOSIS — Z8543 Personal history of malignant neoplasm of ovary: Secondary | ICD-10-CM | POA: Diagnosis not present

## 2013-03-11 DIAGNOSIS — Z880 Allergy status to penicillin: Secondary | ICD-10-CM | POA: Diagnosis not present

## 2013-03-11 DIAGNOSIS — Z79899 Other long term (current) drug therapy: Secondary | ICD-10-CM | POA: Insufficient documentation

## 2013-03-11 DIAGNOSIS — S01501A Unspecified open wound of lip, initial encounter: Secondary | ICD-10-CM | POA: Insufficient documentation

## 2013-03-11 DIAGNOSIS — W1809XA Striking against other object with subsequent fall, initial encounter: Secondary | ICD-10-CM | POA: Insufficient documentation

## 2013-03-11 DIAGNOSIS — Z87891 Personal history of nicotine dependence: Secondary | ICD-10-CM | POA: Diagnosis not present

## 2013-03-11 DIAGNOSIS — F411 Generalized anxiety disorder: Secondary | ICD-10-CM | POA: Insufficient documentation

## 2013-03-11 DIAGNOSIS — S0993XA Unspecified injury of face, initial encounter: Secondary | ICD-10-CM | POA: Insufficient documentation

## 2013-03-11 DIAGNOSIS — Z8781 Personal history of (healed) traumatic fracture: Secondary | ICD-10-CM | POA: Insufficient documentation

## 2013-03-11 DIAGNOSIS — K219 Gastro-esophageal reflux disease without esophagitis: Secondary | ICD-10-CM | POA: Diagnosis not present

## 2013-03-11 DIAGNOSIS — F3289 Other specified depressive episodes: Secondary | ICD-10-CM | POA: Insufficient documentation

## 2013-03-11 DIAGNOSIS — Y92009 Unspecified place in unspecified non-institutional (private) residence as the place of occurrence of the external cause: Secondary | ICD-10-CM | POA: Insufficient documentation

## 2013-03-11 DIAGNOSIS — Y9301 Activity, walking, marching and hiking: Secondary | ICD-10-CM | POA: Insufficient documentation

## 2013-03-11 DIAGNOSIS — F329 Major depressive disorder, single episode, unspecified: Secondary | ICD-10-CM | POA: Insufficient documentation

## 2013-03-11 DIAGNOSIS — S01512A Laceration without foreign body of oral cavity, initial encounter: Secondary | ICD-10-CM

## 2013-03-11 DIAGNOSIS — W19XXXA Unspecified fall, initial encounter: Secondary | ICD-10-CM

## 2013-03-11 DIAGNOSIS — G40909 Epilepsy, unspecified, not intractable, without status epilepticus: Secondary | ICD-10-CM | POA: Diagnosis not present

## 2013-03-11 DIAGNOSIS — Z8679 Personal history of other diseases of the circulatory system: Secondary | ICD-10-CM | POA: Insufficient documentation

## 2013-03-11 DIAGNOSIS — S01502A Unspecified open wound of oral cavity, initial encounter: Secondary | ICD-10-CM | POA: Diagnosis not present

## 2013-03-11 DIAGNOSIS — W010XXA Fall on same level from slipping, tripping and stumbling without subsequent striking against object, initial encounter: Secondary | ICD-10-CM | POA: Insufficient documentation

## 2013-03-11 MED ORDER — OXYCODONE-ACETAMINOPHEN 5-325 MG PO TABS
2.0000 | ORAL_TABLET | Freq: Once | ORAL | Status: AC
  Administered 2013-03-11: 2 via ORAL
  Filled 2013-03-11: qty 2

## 2013-03-11 MED ORDER — HYDROMORPHONE HCL PF 1 MG/ML IJ SOLN
1.0000 mg | Freq: Once | INTRAMUSCULAR | Status: AC
  Administered 2013-03-11: 1 mg via INTRAMUSCULAR
  Filled 2013-03-11: qty 1

## 2013-03-11 NOTE — ED Provider Notes (Signed)
CSN: [id number]     Arrival date & time 03/11/13  2048 History   First MD Initiated Contact with Patient 03/11/13 2134     Chief Complaint  Patient presents with  . Fall   (Consider location/radiation/quality/duration/timing/severity/associated sxs/prior Treatment) Patient is a 67 y.o. female presenting with fall. The history is provided by the patient. No language interpreter was used.  Fall This is a new problem. The current episode started today. Pertinent negatives include no chest pain, joint swelling, myalgias, nausea, neck pain, numbness, visual change, vomiting or weakness.  pt is a 67 year old female who was walking at home and tripped over her dog cage and fell into a wooden chair. She denies any dizziness, weakness or near syncope. She just simply says that she tripped. She reports that she hit her face on the chair. She reports that her face is tender to touch around her left cheek and she has an area inside her mouth at the base of her bottom lip that is laceration and gaping away from her gumline, bleeding is controlled. She reports that she is having some lateral neck soreness but she reports that she has had some neck soreness and stiffness and has had recent adjustments at her chiropractor. No numbness or tingling and she denies spinal tenderness.    Past Medical History  Diagnosis Date  . Mitral valve prolapse 03-10-13    rare palpitations  . Anxiety   . Depression   . Cancer     '10-Ovarian cancer(tx. surgery, chemotherapy) -residual lymph edema lt. leg  . Lymph edema 03-10-13    left leg( hip to foot)-consistent  . GERD (gastroesophageal reflux disease)   . Seizures     '70- x1 post Phenergan IV for nausea during pregnancy  . Wrist fracture     '14-no surgery-no problems now   Past Surgical History  Procedure Laterality Date  . Abdominal hysterectomy      '79-no cancer  . Laparotomy for staging / restaging      '10- [facility] (Dr.Nycum)-Dr. Pippitt, oncology-  surgery Ovarian Cancer  . Appendectomy      '10  with Ovarian staging surgery  . Cataract extraction, bilateral Bilateral   . Tubal ligation    . Fracture surgery Right     '12 -ORIF Rt. shoulder  . Portacath placement     No family history on file. History  Substance Use Topics  . Smoking status: Former Smoker    Quit date: 03/10/1986  . Smokeless tobacco: Not on file  . Alcohol Use: No   OB History   Grav Para Term Preterm Abortions TAB SAB Ect Mult Living                 Review of Systems  Cardiovascular: Negative for chest pain.  Gastrointestinal: Negative for nausea and vomiting.  Musculoskeletal: Negative for joint swelling, myalgias and neck pain.  Skin: Positive for wound.  Neurological: Negative for weakness and numbness.  All other systems reviewed and are negative.    Allergies  Clindamycin/lincomycin; Phenergan; Prochlorperazine; Sulfa antibiotics; and Penicillins  Home Medications   Current Outpatient Rx  Name  Route  Sig  Dispense  Refill  . buPROPion (WELLBUTRIN XL) 300 MG 24 hr tablet   Oral   Take 300 mg by mouth every morning.         . DULoxetine (CYMBALTA) 30 MG capsule   Oral   Take 30 mg by mouth every evening.         Marland Kitchen  LORazepam (ATIVAN) 1 MG tablet   Oral   Take 1 mg by mouth 2 (two) times daily as needed for anxiety.         . Multiple Vitamin (MULTIVITAMIN WITH MINERALS) TABS tablet   Oral   Take 1 tablet by mouth daily.         . pantoprazole (PROTONIX) 40 MG tablet   Oral   Take 40 mg by mouth 2 (two) times daily.         . simvastatin (ZOCOR) 20 MG tablet   Oral   Take 20 mg by mouth every evening.         . tamoxifen (NOLVADEX) 20 MG tablet   Oral   Take 20 mg by mouth at bedtime.         Marland Kitchen zolpidem (AMBIEN) 5 MG tablet   Oral   Take 5 mg by mouth at bedtime as needed for sleep.         Marland Kitchen aspirin EC 81 MG tablet   Oral   Take 81 mg by mouth daily.          BP 192/88  Pulse 75  Temp(Src) 98.4  F (36.9 C) (Oral)  Resp 18  Ht 5\' 4"  (1.626 m)  Wt 155 lb (70.308 kg)  BMI 26.59 kg/m2  SpO2 96% Physical Exam  Nursing note and vitals reviewed. Constitutional: She is oriented to person, place, and time. She appears well-developed and well-nourished. No distress.  HENT:  Head: Normocephalic and atraumatic.    Nose: Nose normal. No sinus tenderness or nasal deformity.  Mouth/Throat: Oropharynx is clear and moist and mucous membranes are normal.  Lower lip avulsed and torn away from the gumline, approx 3 cm in length. Good sensation, no numbness or tingling. No external injury to mouth. Ecchymosis underneath left eye with slight tenderness to palpation, under left eye and left cheek bone. Mid-face intact, no bony abnormalities to palpation.   Eyes: Conjunctivae are normal. Pupils are equal, round, and reactive to light.  Neck: Normal range of motion. Neck supple.  Cardiovascular: Normal rate, regular rhythm, normal heart sounds and intact distal pulses.   Abdominal: Soft. Bowel sounds are normal.  Musculoskeletal: Normal range of motion.       Cervical back: Normal. She exhibits normal range of motion, no tenderness and no bony tenderness.  Neurological: She is oriented to person, place, and time.  Skin: Skin is warm and dry.  Psychiatric: Her behavior is normal. Judgment and thought content normal.    ED Course  LACERATION REPAIR Date/Time: 03/13/2013 12:34 AM Performed by: Irish Elders Authorized by: Irish Elders Consent: Verbal consent obtained. Risks and benefits: risks, benefits and alternatives were discussed Consent given by: patient Patient understanding: patient states understanding of the procedure being performed Site marked: the operative site was marked Imaging studies: imaging studies not available Required items: required blood products, implants, devices, and special equipment available Patient identity confirmed: verbally with patient and arm band Time out:  Immediately prior to procedure a "time out" was called to verify the correct patient, procedure, equipment, support staff and site/side marked as required. Body area: mouth Location details: lower lip, interior Laceration length: 4 cm Foreign bodies: no foreign bodies Tendon involvement: none Nerve involvement: none Vascular damage: no Anesthesia: local infiltration Local anesthetic: lidocaine 2% without epinephrine Anesthetic total: 4 ml Preparation: Patient was prepped and draped in the usual sterile fashion. Irrigation solution: saline Irrigation method: syringe Amount of cleaning: standard Subcutaneous closure: 5-0  Vicryl Number of sutures: 7 Technique: simple Approximation: close Approximation difficulty: complex Dressing: 4x4 sterile gauze Patient tolerance: Patient tolerated the procedure well with no immediate complications. Comments: Lower lip, avulsed from gum line. 7 sutures well approximated but complex repair.   (including critical care time) Labs Review Labs Reviewed - No data to display Imaging Review No results found.  EKG Interpretation   None       MDM   1. Fall, initial encounter   2. Laceration of mouth, initial encounter    Laceration to inner lower lip and avulsed from gumline, laceration repair. Discussed mouth care and salt water gargles. Rest and ibuprofen. Will have some bruising around left eye. No pain with EOM's, neuro intact, no focal deficits or weakness.     Irish Elders, NP 03/13/13 [phone]

## 2013-03-11 NOTE — ED Notes (Addendum)
Pt c/o head, neck and facial pain after tripping and falling and hitting her face on a wooden chair 1 hour ago.  Pt sts. Her "neck feels sore." Swelling noted to face and lips. Laceration at lower gum line in mouth. Pt denies LOC, dizziness or light headedness before fall, SOB, numbness, tingling. A&Ox4. NAD noted. Pt has hx of cancer and lymphedema in L leg. L leg and foot appear very swollen. Pt sts. "she can't feel as much in L leg.

## 2013-03-11 NOTE — ED Notes (Addendum)
Pt states she tripped over dog cage @ 1 hour ago hitting face on wooden chair. Pt denies LOC. Swelling noted to face and upper lip. Pt reports laceration to lower gumline. Pt is tearful in triage. Pt states she "just does not feel good", pt also c/o neck soreness. Collar applied

## 2013-03-11 NOTE — ED Provider Notes (Signed)
Medical screening examination/treatment/procedure(s) were conducted as a shared visit with non-physician practitioner(s) and myself.  I personally evaluated the patient during the encounter.  EKG Interpretation   None       67 year old female who fell and struck a wooden chair. Her primary injury appears to be a laceration of her lower gum line. This laceration is extensive and deep, but does not appear to have any complicating features.  She has normal facial sensation and normal motor function of her lower lip.  Laceration will require repair due to its gaping nature.  Clinical Impression: 1. Fall, initial encounter   2. Laceration of mouth, initial encounter       Candyce Churn, MD 03/12/13 1445

## 2013-03-13 NOTE — ED Provider Notes (Signed)
Medical screening examination/treatment/procedure(s) were conducted as a shared visit with non-physician practitioner(s) and myself.  I personally evaluated the patient during the encounter.   Please see my separate note.     Candyce Churn, MD 03/13/13 [phone]

## 2013-03-14 DIAGNOSIS — B373 Candidiasis of vulva and vagina: Secondary | ICD-10-CM | POA: Diagnosis not present

## 2013-03-14 DIAGNOSIS — C569 Malignant neoplasm of unspecified ovary: Secondary | ICD-10-CM | POA: Diagnosis not present

## 2013-03-14 DIAGNOSIS — Z79899 Other long term (current) drug therapy: Secondary | ICD-10-CM | POA: Diagnosis not present

## 2013-03-14 DIAGNOSIS — Z87891 Personal history of nicotine dependence: Secondary | ICD-10-CM | POA: Diagnosis not present

## 2013-03-14 DIAGNOSIS — Z79810 Long term (current) use of selective estrogen receptor modulators (SERMs): Secondary | ICD-10-CM | POA: Diagnosis not present

## 2013-03-14 DIAGNOSIS — Z7982 Long term (current) use of aspirin: Secondary | ICD-10-CM | POA: Diagnosis not present

## 2013-03-14 DIAGNOSIS — I059 Rheumatic mitral valve disease, unspecified: Secondary | ICD-10-CM | POA: Diagnosis not present

## 2013-03-14 DIAGNOSIS — E785 Hyperlipidemia, unspecified: Secondary | ICD-10-CM | POA: Diagnosis not present

## 2013-03-14 DIAGNOSIS — I890 Lymphedema, not elsewhere classified: Secondary | ICD-10-CM | POA: Diagnosis not present

## 2013-03-22 DIAGNOSIS — C569 Malignant neoplasm of unspecified ovary: Secondary | ICD-10-CM | POA: Diagnosis not present

## 2013-03-28 DIAGNOSIS — B373 Candidiasis of vulva and vagina: Secondary | ICD-10-CM | POA: Diagnosis not present

## 2013-03-28 DIAGNOSIS — E785 Hyperlipidemia, unspecified: Secondary | ICD-10-CM | POA: Diagnosis not present

## 2013-03-28 DIAGNOSIS — I890 Lymphedema, not elsewhere classified: Secondary | ICD-10-CM | POA: Diagnosis not present

## 2013-03-28 DIAGNOSIS — C569 Malignant neoplasm of unspecified ovary: Secondary | ICD-10-CM | POA: Diagnosis not present

## 2013-03-28 DIAGNOSIS — I059 Rheumatic mitral valve disease, unspecified: Secondary | ICD-10-CM | POA: Diagnosis not present

## 2013-03-28 DIAGNOSIS — Z7982 Long term (current) use of aspirin: Secondary | ICD-10-CM | POA: Diagnosis not present

## 2013-04-18 ENCOUNTER — Encounter (HOSPITAL_COMMUNITY): Payer: Self-pay | Admitting: Anesthesiology

## 2013-04-18 ENCOUNTER — Ambulatory Visit (HOSPITAL_COMMUNITY): Admission: RE | Admit: 2013-04-18 | Payer: Medicare Other | Source: Ambulatory Visit | Admitting: Gastroenterology

## 2013-04-18 HISTORY — DX: Gastro-esophageal reflux disease without esophagitis: K21.9

## 2013-04-18 HISTORY — DX: Lymphedema, not elsewhere classified: I89.0

## 2013-04-18 HISTORY — DX: Major depressive disorder, single episode, unspecified: F32.9

## 2013-04-18 HISTORY — DX: Nonrheumatic mitral (valve) prolapse: I34.1

## 2013-04-18 HISTORY — DX: Anxiety disorder, unspecified: F41.9

## 2013-04-18 HISTORY — DX: Depression, unspecified: F32.A

## 2013-04-18 HISTORY — DX: Malignant (primary) neoplasm, unspecified: C80.1

## 2013-04-18 HISTORY — DX: Unspecified convulsions: R56.9

## 2013-04-18 SURGERY — COLONOSCOPY WITH PROPOFOL
Anesthesia: Monitor Anesthesia Care

## 2013-04-18 NOTE — Anesthesia Preprocedure Evaluation (Deleted)
Anesthesia Evaluation  Patient identified by MRN, date of birth, ID band Patient awake    Reviewed: Allergy & Precautions, H&P , NPO status , Patient's Chart, lab work & pertinent test results  Airway Mallampati: II  TM Distance: >3 FB Neck ROM: Full    Dental no notable dental hx.    Pulmonary neg pulmonary ROS, former smoker,  breath sounds clear to auscultation  Pulmonary exam normal       Cardiovascular negative cardio ROS  Rhythm:Regular Rate:Normal     Neuro/Psych negative neurological ROS  negative psych ROS   GI/Hepatic negative GI ROS, Neg liver ROS,   Endo/Other  negative endocrine ROS  Renal/GU negative Renal ROS  negative genitourinary   Musculoskeletal negative musculoskeletal ROS (+)   Abdominal   Peds negative pediatric ROS (+)  Hematology negative hematology ROS (+)   Anesthesia Other Findings   Reproductive/Obstetrics negative OB ROS                             Anesthesia Physical Anesthesia Plan  ASA: II  Anesthesia Plan: MAC   Post-op Pain Management:    Induction: Intravenous  Airway Management Planned: Nasal Cannula  Additional Equipment:   Intra-op Plan:   Post-operative Plan:   Informed Consent: I have reviewed the patients History and Physical, chart, labs and discussed the procedure including the risks, benefits and alternatives for the proposed anesthesia with the patient or authorized representative who has indicated his/her understanding and acceptance.   Dental advisory given  Plan Discussed with: CRNA and Surgeon  Anesthesia Plan Comments:         Anesthesia Quick Evaluation  

## 2013-04-25 DIAGNOSIS — Z79810 Long term (current) use of selective estrogen receptor modulators (SERMs): Secondary | ICD-10-CM | POA: Diagnosis not present

## 2013-04-25 DIAGNOSIS — C569 Malignant neoplasm of unspecified ovary: Secondary | ICD-10-CM | POA: Diagnosis not present

## 2013-04-25 DIAGNOSIS — Z9221 Personal history of antineoplastic chemotherapy: Secondary | ICD-10-CM | POA: Diagnosis not present

## 2013-04-25 DIAGNOSIS — Z9079 Acquired absence of other genital organ(s): Secondary | ICD-10-CM | POA: Diagnosis not present

## 2013-04-25 DIAGNOSIS — Z90711 Acquired absence of uterus with remaining cervical stump: Secondary | ICD-10-CM | POA: Diagnosis not present

## 2013-04-25 DIAGNOSIS — Z452 Encounter for adjustment and management of vascular access device: Secondary | ICD-10-CM | POA: Diagnosis not present

## 2013-05-10 DIAGNOSIS — Z23 Encounter for immunization: Secondary | ICD-10-CM | POA: Diagnosis not present

## 2013-05-22 ENCOUNTER — Ambulatory Visit: Payer: Self-pay | Admitting: Podiatry

## 2013-05-22 ENCOUNTER — Other Ambulatory Visit: Payer: Self-pay | Admitting: Gastroenterology

## 2013-05-23 ENCOUNTER — Encounter (HOSPITAL_COMMUNITY): Payer: Self-pay | Admitting: Pharmacy Technician

## 2013-05-23 ENCOUNTER — Encounter (HOSPITAL_COMMUNITY): Payer: Self-pay | Admitting: *Deleted

## 2013-06-05 ENCOUNTER — Ambulatory Visit: Payer: Self-pay | Admitting: Podiatry

## 2013-06-06 ENCOUNTER — Encounter (HOSPITAL_COMMUNITY): Payer: Self-pay | Admitting: Anesthesiology

## 2013-06-06 ENCOUNTER — Ambulatory Visit (HOSPITAL_COMMUNITY): Admission: RE | Admit: 2013-06-06 | Payer: Medicare Other | Source: Ambulatory Visit | Admitting: Gastroenterology

## 2013-06-06 SURGERY — COLONOSCOPY WITH PROPOFOL
Anesthesia: Monitor Anesthesia Care

## 2013-06-06 MED ORDER — LIDOCAINE HCL (CARDIAC) 20 MG/ML IV SOLN
INTRAVENOUS | Status: AC
  Filled 2013-06-06: qty 5

## 2013-06-06 MED ORDER — PROPOFOL 10 MG/ML IV BOLUS
INTRAVENOUS | Status: AC
  Filled 2013-06-06: qty 20

## 2013-06-06 NOTE — Anesthesia Preprocedure Evaluation (Deleted)
Anesthesia Evaluation  Patient identified by MRN, date of birth, ID band Patient awake    Reviewed: Allergy & Precautions, H&P , NPO status , Patient's Chart, lab work & pertinent test results  Airway Mallampati: II TM Distance: >3 FB Neck ROM: Full    Dental no notable dental hx.    Pulmonary neg pulmonary ROS, former smoker,  breath sounds clear to auscultation  Pulmonary exam normal       Cardiovascular negative cardio ROS  Rhythm:Regular Rate:Normal     Neuro/Psych Seizures -,  PSYCHIATRIC DISORDERS Anxiety Depression    GI/Hepatic Neg liver ROS, GERD-  Medicated,  Endo/Other  negative endocrine ROS  Renal/GU negative Renal ROS  negative genitourinary   Musculoskeletal negative musculoskeletal ROS (+)   Abdominal   Peds negative pediatric ROS (+)  Hematology negative hematology ROS (+)   Anesthesia Other Findings   Reproductive/Obstetrics negative OB ROS                           Anesthesia Physical Anesthesia Plan  ASA: II  Anesthesia Plan: MAC   Post-op Pain Management:    Induction: Intravenous  Airway Management Planned:   Additional Equipment:   Intra-op Plan:   Post-operative Plan:   Informed Consent: I have reviewed the patients History and Physical, chart, labs and discussed the procedure including the risks, benefits and alternatives for the proposed anesthesia with the patient or authorized representative who has indicated his/her understanding and acceptance.   Dental advisory given  Plan Discussed with: CRNA  Anesthesia Plan Comments:         Anesthesia Quick Evaluation

## 2013-06-20 DIAGNOSIS — Z452 Encounter for adjustment and management of vascular access device: Secondary | ICD-10-CM | POA: Diagnosis not present

## 2013-06-20 DIAGNOSIS — C569 Malignant neoplasm of unspecified ovary: Secondary | ICD-10-CM | POA: Diagnosis not present

## 2013-06-28 DIAGNOSIS — F329 Major depressive disorder, single episode, unspecified: Secondary | ICD-10-CM | POA: Diagnosis not present

## 2013-06-28 DIAGNOSIS — E785 Hyperlipidemia, unspecified: Secondary | ICD-10-CM | POA: Diagnosis not present

## 2013-06-28 DIAGNOSIS — F3289 Other specified depressive episodes: Secondary | ICD-10-CM | POA: Diagnosis not present

## 2013-06-28 DIAGNOSIS — N189 Chronic kidney disease, unspecified: Secondary | ICD-10-CM | POA: Diagnosis not present

## 2013-06-28 DIAGNOSIS — G479 Sleep disorder, unspecified: Secondary | ICD-10-CM | POA: Diagnosis not present

## 2013-07-20 DIAGNOSIS — I059 Rheumatic mitral valve disease, unspecified: Secondary | ICD-10-CM | POA: Diagnosis not present

## 2013-07-20 DIAGNOSIS — Z7982 Long term (current) use of aspirin: Secondary | ICD-10-CM | POA: Diagnosis not present

## 2013-07-20 DIAGNOSIS — Z90711 Acquired absence of uterus with remaining cervical stump: Secondary | ICD-10-CM | POA: Diagnosis not present

## 2013-07-20 DIAGNOSIS — Z79810 Long term (current) use of selective estrogen receptor modulators (SERMs): Secondary | ICD-10-CM | POA: Diagnosis not present

## 2013-07-20 DIAGNOSIS — Z87891 Personal history of nicotine dependence: Secondary | ICD-10-CM | POA: Diagnosis not present

## 2013-07-20 DIAGNOSIS — Z79899 Other long term (current) drug therapy: Secondary | ICD-10-CM | POA: Diagnosis not present

## 2013-07-20 DIAGNOSIS — Z9221 Personal history of antineoplastic chemotherapy: Secondary | ICD-10-CM | POA: Diagnosis not present

## 2013-07-20 DIAGNOSIS — Z9079 Acquired absence of other genital organ(s): Secondary | ICD-10-CM | POA: Diagnosis not present

## 2013-07-20 DIAGNOSIS — F411 Generalized anxiety disorder: Secondary | ICD-10-CM | POA: Diagnosis not present

## 2013-07-20 DIAGNOSIS — K5900 Constipation, unspecified: Secondary | ICD-10-CM | POA: Diagnosis not present

## 2013-07-20 DIAGNOSIS — Z452 Encounter for adjustment and management of vascular access device: Secondary | ICD-10-CM | POA: Diagnosis not present

## 2013-07-20 DIAGNOSIS — R599 Enlarged lymph nodes, unspecified: Secondary | ICD-10-CM | POA: Diagnosis not present

## 2013-07-20 DIAGNOSIS — E785 Hyperlipidemia, unspecified: Secondary | ICD-10-CM | POA: Diagnosis not present

## 2013-07-20 DIAGNOSIS — C569 Malignant neoplasm of unspecified ovary: Secondary | ICD-10-CM | POA: Diagnosis not present

## 2013-07-20 DIAGNOSIS — I890 Lymphedema, not elsewhere classified: Secondary | ICD-10-CM | POA: Diagnosis not present

## 2013-08-01 ENCOUNTER — Other Ambulatory Visit: Payer: Self-pay | Admitting: Gastroenterology

## 2013-08-04 DIAGNOSIS — Z881 Allergy status to other antibiotic agents status: Secondary | ICD-10-CM | POA: Diagnosis not present

## 2013-08-04 DIAGNOSIS — I059 Rheumatic mitral valve disease, unspecified: Secondary | ICD-10-CM | POA: Diagnosis not present

## 2013-08-04 DIAGNOSIS — Z7982 Long term (current) use of aspirin: Secondary | ICD-10-CM | POA: Diagnosis not present

## 2013-08-04 DIAGNOSIS — C770 Secondary and unspecified malignant neoplasm of lymph nodes of head, face and neck: Secondary | ICD-10-CM | POA: Diagnosis not present

## 2013-08-04 DIAGNOSIS — Z882 Allergy status to sulfonamides status: Secondary | ICD-10-CM | POA: Diagnosis not present

## 2013-08-04 DIAGNOSIS — F411 Generalized anxiety disorder: Secondary | ICD-10-CM | POA: Diagnosis not present

## 2013-08-04 DIAGNOSIS — Z880 Allergy status to penicillin: Secondary | ICD-10-CM | POA: Diagnosis not present

## 2013-08-04 DIAGNOSIS — C569 Malignant neoplasm of unspecified ovary: Secondary | ICD-10-CM | POA: Diagnosis not present

## 2013-08-04 DIAGNOSIS — Z79899 Other long term (current) drug therapy: Secondary | ICD-10-CM | POA: Diagnosis not present

## 2013-08-04 DIAGNOSIS — E785 Hyperlipidemia, unspecified: Secondary | ICD-10-CM | POA: Diagnosis not present

## 2013-08-04 DIAGNOSIS — Z79810 Long term (current) use of selective estrogen receptor modulators (SERMs): Secondary | ICD-10-CM | POA: Diagnosis not present

## 2013-08-04 DIAGNOSIS — R599 Enlarged lymph nodes, unspecified: Secondary | ICD-10-CM | POA: Diagnosis not present

## 2013-08-07 DIAGNOSIS — E785 Hyperlipidemia, unspecified: Secondary | ICD-10-CM | POA: Diagnosis not present

## 2013-08-07 DIAGNOSIS — N189 Chronic kidney disease, unspecified: Secondary | ICD-10-CM | POA: Diagnosis not present

## 2013-08-14 DIAGNOSIS — Z9221 Personal history of antineoplastic chemotherapy: Secondary | ICD-10-CM | POA: Diagnosis not present

## 2013-08-14 DIAGNOSIS — Z79810 Long term (current) use of selective estrogen receptor modulators (SERMs): Secondary | ICD-10-CM | POA: Diagnosis not present

## 2013-08-14 DIAGNOSIS — Z7189 Other specified counseling: Secondary | ICD-10-CM | POA: Diagnosis not present

## 2013-08-14 DIAGNOSIS — Z9049 Acquired absence of other specified parts of digestive tract: Secondary | ICD-10-CM | POA: Diagnosis not present

## 2013-08-14 DIAGNOSIS — C569 Malignant neoplasm of unspecified ovary: Secondary | ICD-10-CM | POA: Diagnosis not present

## 2013-08-14 DIAGNOSIS — I890 Lymphedema, not elsewhere classified: Secondary | ICD-10-CM | POA: Diagnosis not present

## 2013-08-14 DIAGNOSIS — R971 Elevated cancer antigen 125 [CA 125]: Secondary | ICD-10-CM | POA: Diagnosis not present

## 2013-08-14 DIAGNOSIS — R599 Enlarged lymph nodes, unspecified: Secondary | ICD-10-CM | POA: Diagnosis not present

## 2013-08-14 DIAGNOSIS — C779 Secondary and unspecified malignant neoplasm of lymph node, unspecified: Secondary | ICD-10-CM | POA: Diagnosis not present

## 2013-08-17 DIAGNOSIS — C779 Secondary and unspecified malignant neoplasm of lymph node, unspecified: Secondary | ICD-10-CM | POA: Diagnosis not present

## 2013-08-17 DIAGNOSIS — Z79810 Long term (current) use of selective estrogen receptor modulators (SERMs): Secondary | ICD-10-CM | POA: Diagnosis not present

## 2013-08-17 DIAGNOSIS — R971 Elevated cancer antigen 125 [CA 125]: Secondary | ICD-10-CM | POA: Diagnosis not present

## 2013-08-17 DIAGNOSIS — C569 Malignant neoplasm of unspecified ovary: Secondary | ICD-10-CM | POA: Diagnosis not present

## 2013-08-17 DIAGNOSIS — R599 Enlarged lymph nodes, unspecified: Secondary | ICD-10-CM | POA: Diagnosis not present

## 2013-08-17 DIAGNOSIS — I890 Lymphedema, not elsewhere classified: Secondary | ICD-10-CM | POA: Diagnosis not present

## 2013-08-21 ENCOUNTER — Encounter (HOSPITAL_COMMUNITY): Payer: Self-pay | Admitting: *Deleted

## 2013-08-21 DIAGNOSIS — C760 Malignant neoplasm of head, face and neck: Secondary | ICD-10-CM

## 2013-08-21 HISTORY — DX: Malignant neoplasm of head, face and neck: C76.0

## 2013-08-21 NOTE — Progress Notes (Signed)
08-21-13 Pt. To make Dr. Wynetta Emery aware chemotherapy started this week.

## 2013-08-22 DIAGNOSIS — R918 Other nonspecific abnormal finding of lung field: Secondary | ICD-10-CM | POA: Diagnosis not present

## 2013-08-22 DIAGNOSIS — K449 Diaphragmatic hernia without obstruction or gangrene: Secondary | ICD-10-CM | POA: Diagnosis not present

## 2013-08-22 DIAGNOSIS — D485 Neoplasm of uncertain behavior of skin: Secondary | ICD-10-CM | POA: Diagnosis not present

## 2013-08-22 DIAGNOSIS — D2370 Other benign neoplasm of skin of unspecified lower limb, including hip: Secondary | ICD-10-CM | POA: Diagnosis not present

## 2013-08-22 DIAGNOSIS — N949 Unspecified condition associated with female genital organs and menstrual cycle: Secondary | ICD-10-CM | POA: Diagnosis not present

## 2013-08-22 DIAGNOSIS — C569 Malignant neoplasm of unspecified ovary: Secondary | ICD-10-CM | POA: Diagnosis not present

## 2013-08-22 DIAGNOSIS — R911 Solitary pulmonary nodule: Secondary | ICD-10-CM | POA: Diagnosis not present

## 2013-08-23 ENCOUNTER — Encounter (HOSPITAL_COMMUNITY): Payer: Self-pay | Admitting: Pharmacy Technician

## 2013-08-24 DIAGNOSIS — R971 Elevated cancer antigen 125 [CA 125]: Secondary | ICD-10-CM | POA: Diagnosis not present

## 2013-08-24 DIAGNOSIS — R599 Enlarged lymph nodes, unspecified: Secondary | ICD-10-CM | POA: Diagnosis not present

## 2013-08-24 DIAGNOSIS — Z79810 Long term (current) use of selective estrogen receptor modulators (SERMs): Secondary | ICD-10-CM | POA: Diagnosis not present

## 2013-08-24 DIAGNOSIS — I890 Lymphedema, not elsewhere classified: Secondary | ICD-10-CM | POA: Diagnosis not present

## 2013-08-24 DIAGNOSIS — C779 Secondary and unspecified malignant neoplasm of lymph node, unspecified: Secondary | ICD-10-CM | POA: Diagnosis not present

## 2013-08-24 DIAGNOSIS — C569 Malignant neoplasm of unspecified ovary: Secondary | ICD-10-CM | POA: Diagnosis not present

## 2013-08-28 DIAGNOSIS — Z961 Presence of intraocular lens: Secondary | ICD-10-CM | POA: Diagnosis not present

## 2013-08-28 DIAGNOSIS — H40029 Open angle with borderline findings, high risk, unspecified eye: Secondary | ICD-10-CM | POA: Diagnosis not present

## 2013-08-28 DIAGNOSIS — H43399 Other vitreous opacities, unspecified eye: Secondary | ICD-10-CM | POA: Diagnosis not present

## 2013-08-28 DIAGNOSIS — H524 Presbyopia: Secondary | ICD-10-CM | POA: Diagnosis not present

## 2013-09-04 DIAGNOSIS — K2990 Gastroduodenitis, unspecified, without bleeding: Secondary | ICD-10-CM | POA: Diagnosis not present

## 2013-09-04 DIAGNOSIS — R112 Nausea with vomiting, unspecified: Secondary | ICD-10-CM | POA: Diagnosis not present

## 2013-09-04 DIAGNOSIS — K2970 Gastritis, unspecified, without bleeding: Secondary | ICD-10-CM | POA: Diagnosis not present

## 2013-09-04 DIAGNOSIS — R10819 Abdominal tenderness, unspecified site: Secondary | ICD-10-CM | POA: Diagnosis not present

## 2013-09-05 ENCOUNTER — Inpatient Hospital Stay (HOSPITAL_COMMUNITY)
Admission: EM | Admit: 2013-09-05 | Discharge: 2013-09-09 | DRG: 389 | Disposition: A | Discharge status: Home / Self Care | Payer: Medicare Other | Attending: Internal Medicine | Admitting: Internal Medicine

## 2013-09-05 ENCOUNTER — Encounter (HOSPITAL_COMMUNITY): Payer: Self-pay | Admitting: Emergency Medicine

## 2013-09-05 ENCOUNTER — Emergency Department (HOSPITAL_COMMUNITY): Payer: Medicare Other

## 2013-09-05 DIAGNOSIS — C569 Malignant neoplasm of unspecified ovary: Secondary | ICD-10-CM | POA: Diagnosis not present

## 2013-09-05 DIAGNOSIS — F419 Anxiety disorder, unspecified: Secondary | ICD-10-CM | POA: Diagnosis present

## 2013-09-05 DIAGNOSIS — Z79899 Other long term (current) drug therapy: Secondary | ICD-10-CM

## 2013-09-05 DIAGNOSIS — F411 Generalized anxiety disorder: Secondary | ICD-10-CM

## 2013-09-05 DIAGNOSIS — Z880 Allergy status to penicillin: Secondary | ICD-10-CM | POA: Diagnosis not present

## 2013-09-05 DIAGNOSIS — R141 Gas pain: Secondary | ICD-10-CM | POA: Diagnosis not present

## 2013-09-05 DIAGNOSIS — Z9221 Personal history of antineoplastic chemotherapy: Secondary | ICD-10-CM | POA: Diagnosis not present

## 2013-09-05 DIAGNOSIS — F32A Depression, unspecified: Secondary | ICD-10-CM

## 2013-09-05 DIAGNOSIS — Z882 Allergy status to sulfonamides status: Secondary | ICD-10-CM | POA: Diagnosis not present

## 2013-09-05 DIAGNOSIS — F3289 Other specified depressive episodes: Secondary | ICD-10-CM | POA: Diagnosis not present

## 2013-09-05 DIAGNOSIS — K56609 Unspecified intestinal obstruction, unspecified as to partial versus complete obstruction: Secondary | ICD-10-CM | POA: Diagnosis not present

## 2013-09-05 DIAGNOSIS — Z8249 Family history of ischemic heart disease and other diseases of the circulatory system: Secondary | ICD-10-CM

## 2013-09-05 DIAGNOSIS — E876 Hypokalemia: Secondary | ICD-10-CM | POA: Diagnosis not present

## 2013-09-05 DIAGNOSIS — F329 Major depressive disorder, single episode, unspecified: Secondary | ICD-10-CM | POA: Diagnosis present

## 2013-09-05 DIAGNOSIS — R109 Unspecified abdominal pain: Secondary | ICD-10-CM | POA: Diagnosis not present

## 2013-09-05 DIAGNOSIS — K219 Gastro-esophageal reflux disease without esophagitis: Secondary | ICD-10-CM | POA: Diagnosis not present

## 2013-09-05 DIAGNOSIS — Z888 Allergy status to other drugs, medicaments and biological substances status: Secondary | ICD-10-CM | POA: Diagnosis not present

## 2013-09-05 DIAGNOSIS — E785 Hyperlipidemia, unspecified: Secondary | ICD-10-CM

## 2013-09-05 DIAGNOSIS — K429 Umbilical hernia without obstruction or gangrene: Secondary | ICD-10-CM | POA: Diagnosis not present

## 2013-09-05 DIAGNOSIS — Z7982 Long term (current) use of aspirin: Secondary | ICD-10-CM

## 2013-09-05 DIAGNOSIS — I059 Rheumatic mitral valve disease, unspecified: Secondary | ICD-10-CM | POA: Diagnosis present

## 2013-09-05 DIAGNOSIS — R143 Flatulence: Secondary | ICD-10-CM | POA: Diagnosis not present

## 2013-09-05 DIAGNOSIS — Z87891 Personal history of nicotine dependence: Secondary | ICD-10-CM | POA: Diagnosis not present

## 2013-09-05 LAB — URINALYSIS, ROUTINE W REFLEX MICROSCOPIC
Bilirubin Urine: NEGATIVE
Glucose, UA: NEGATIVE mg/dL
Hgb urine dipstick: NEGATIVE
Ketones, ur: 40 mg/dL — AB
Leukocytes, UA: NEGATIVE
NITRITE: NEGATIVE
PH: 5.5 (ref 5.0–8.0)
Protein, ur: NEGATIVE mg/dL
Specific Gravity, Urine: 1.022 (ref 1.005–1.030)
UROBILINOGEN UA: 0.2 mg/dL (ref 0.0–1.0)

## 2013-09-05 LAB — COMPREHENSIVE METABOLIC PANEL
ALK PHOS: 80 U/L (ref 39–117)
ALT: 15 U/L (ref 0–35)
ALT: 17 U/L (ref 0–35)
AST: 29 U/L (ref 0–37)
AST: 25 U/L (ref 0–37)
Albumin: 3.5 g/dL (ref 3.5–5.2)
Albumin: 3 g/dL — ABNORMAL LOW (ref 3.5–5.2)
Alkaline Phosphatase: 93 U/L (ref 39–117)
BUN: 12 mg/dL (ref 6–23)
BUN: 12 mg/dL (ref 6–23)
CALCIUM: 8.5 mg/dL (ref 8.4–10.5)
CALCIUM: 9.9 mg/dL (ref 8.4–10.5)
CO2: 28 mEq/L (ref 19–32)
CO2: 27 mEq/L (ref 19–32)
Chloride: 96 mEq/L (ref 96–112)
Chloride: 102 mEq/L (ref 96–112)
Creatinine, Ser: 1.09 mg/dL (ref 0.50–1.10)
Creatinine, Ser: 1.07 mg/dL (ref 0.50–1.10)
GFR calc non Af Amer: 52 mL/min — ABNORMAL LOW (ref >90)
GFR, EST AFRICAN AMERICAN: 60 mL/min — AB (ref >90)
GFR, EST AFRICAN AMERICAN: 61 mL/min — AB (ref >90)
GFR, EST NON AFRICAN AMERICAN: 53 mL/min — AB (ref >90)
GLUCOSE: 131 mg/dL — AB (ref 70–99)
GLUCOSE: 133 mg/dL — AB (ref 70–99)
Potassium: 3.7 mEq/L (ref 3.7–5.3)
Potassium: 3.8 mEq/L (ref 3.7–5.3)
SODIUM: 137 meq/L (ref 137–147)
Sodium: 140 mEq/L (ref 137–147)
TOTAL PROTEIN: 6.6 g/dL (ref 6.0–8.3)
TOTAL PROTEIN: 7.7 g/dL (ref 6.0–8.3)
Total Bilirubin: 0.4 mg/dL (ref 0.3–1.2)
Total Bilirubin: 0.4 mg/dL (ref 0.3–1.2)

## 2013-09-05 LAB — CBC
HCT: 34.8 % — ABNORMAL LOW (ref 36.0–46.0)
HEMOGLOBIN: 11.2 g/dL — AB (ref 12.0–15.0)
MCH: 27.5 pg (ref 26.0–34.0)
MCHC: 32.2 g/dL (ref 30.0–36.0)
MCV: 85.5 fL (ref 78.0–100.0)
Platelets: 156 10*3/uL (ref 150–400)
RBC: 4.07 MIL/uL (ref 3.87–5.11)
RDW: 14.2 % (ref 11.5–15.5)
WBC: 6.3 10*3/uL (ref 4.0–10.5)

## 2013-09-05 LAB — CBC WITH DIFFERENTIAL/PLATELET
Basophils Absolute: 0 10*3/uL (ref 0.0–0.1)
Basophils Relative: 0 % (ref 0–1)
EOS ABS: 0 10*3/uL (ref 0.0–0.7)
Eosinophils Relative: 0 % (ref 0–5)
HCT: 37.4 % (ref 36.0–46.0)
Hemoglobin: 12.4 g/dL (ref 12.0–15.0)
LYMPHS ABS: 1.5 10*3/uL (ref 0.7–4.0)
Lymphocytes Relative: 20 % (ref 12–46)
MCH: 28.1 pg (ref 26.0–34.0)
MCHC: 33.2 g/dL (ref 30.0–36.0)
MCV: 84.8 fL (ref 78.0–100.0)
Monocytes Absolute: 0.4 10*3/uL (ref 0.1–1.0)
Monocytes Relative: 5 % (ref 3–12)
NEUTROS PCT: 75 % (ref 43–77)
Neutro Abs: 5.6 10*3/uL (ref 1.7–7.7)
PLATELETS: 183 10*3/uL (ref 150–400)
RBC: 4.41 MIL/uL (ref 3.87–5.11)
RDW: 14.2 % (ref 11.5–15.5)
WBC: 7.4 10*3/uL (ref 4.0–10.5)

## 2013-09-05 LAB — GLUCOSE, CAPILLARY: Glucose-Capillary: 130 mg/dL — ABNORMAL HIGH (ref 70–99)

## 2013-09-05 LAB — LIPASE, BLOOD: LIPASE: 34 U/L (ref 11–59)

## 2013-09-05 MED ORDER — ASPIRIN EC 81 MG PO TBEC
81.0000 mg | DELAYED_RELEASE_TABLET | Freq: Every day | ORAL | Status: DC
  Administered 2013-09-05 – 2013-09-09 (×5): 81 mg via ORAL
  Filled 2013-09-05 (×5): qty 1

## 2013-09-05 MED ORDER — IOHEXOL 300 MG/ML  SOLN
50.0000 mL | Freq: Once | INTRAMUSCULAR | Status: AC | PRN
  Administered 2013-09-05: 50 mL via ORAL

## 2013-09-05 MED ORDER — ADULT MULTIVITAMIN W/MINERALS CH
1.0000 | ORAL_TABLET | Freq: Every day | ORAL | Status: DC
  Administered 2013-09-05 – 2013-09-09 (×5): 1 via ORAL
  Filled 2013-09-05 (×5): qty 1

## 2013-09-05 MED ORDER — ACETAMINOPHEN 650 MG RE SUPP
650.0000 mg | Freq: Four times a day (QID) | RECTAL | Status: DC | PRN
Start: 2013-09-05 — End: 2013-09-09

## 2013-09-05 MED ORDER — HYDROMORPHONE HCL PF 1 MG/ML IJ SOLN
1.0000 mg | INTRAMUSCULAR | Status: DC | PRN
  Administered 2013-09-05 – 2013-09-08 (×15): 1 mg via INTRAVENOUS
  Filled 2013-09-05 (×16): qty 1

## 2013-09-05 MED ORDER — SIMVASTATIN 20 MG PO TABS
20.0000 mg | ORAL_TABLET | Freq: Every evening | ORAL | Status: DC
  Administered 2013-09-05 – 2013-09-08 (×4): 20 mg via ORAL
  Filled 2013-09-05 (×5): qty 1

## 2013-09-05 MED ORDER — ONDANSETRON HCL 4 MG/2ML IJ SOLN: 4.0000 mg | Freq: Four times a day (QID) | INTRAMUSCULAR | Status: DC | PRN

## 2013-09-05 MED ORDER — ONDANSETRON HCL 4 MG PO TABS
4.0000 mg | ORAL_TABLET | Freq: Four times a day (QID) | ORAL | Status: DC | PRN
Start: 2013-09-05 — End: 2013-09-09

## 2013-09-05 MED ORDER — LORAZEPAM 2 MG/ML IJ SOLN
1.0000 mg | Freq: Three times a day (TID) | INTRAMUSCULAR | Status: DC | PRN
  Administered 2013-09-05 – 2013-09-09 (×5): 1 mg via INTRAVENOUS
  Filled 2013-09-05 (×5): qty 1

## 2013-09-05 MED ORDER — PANTOPRAZOLE SODIUM 40 MG IV SOLR
40.0000 mg | INTRAVENOUS | Status: DC
  Administered 2013-09-05 – 2013-09-09 (×5): 40 mg via INTRAVENOUS
  Filled 2013-09-05 (×7): qty 40

## 2013-09-05 MED ORDER — ONDANSETRON HCL 4 MG/2ML IJ SOLN
4.0000 mg | Freq: Once | INTRAMUSCULAR | Status: AC
Start: 2013-09-05 — End: 2013-09-05
  Administered 2013-09-05: 4 mg via INTRAVENOUS
  Filled 2013-09-05: qty 2

## 2013-09-05 MED ORDER — BUPROPION HCL ER (XL) 300 MG PO TB24
300.0000 mg | ORAL_TABLET | Freq: Every morning | ORAL | Status: DC
  Administered 2013-09-05 – 2013-09-09 (×5): 300 mg via ORAL
  Filled 2013-09-05 (×5): qty 1

## 2013-09-05 MED ORDER — MORPHINE SULFATE 2 MG/ML IJ SOLN: 1.0000 mg | INTRAMUSCULAR | Status: DC | PRN

## 2013-09-05 MED ORDER — HYDROMORPHONE HCL PF 1 MG/ML IJ SOLN
1.0000 mg | Freq: Once | INTRAMUSCULAR | Status: AC
  Administered 2013-09-05: 1 mg via INTRAVENOUS
  Filled 2013-09-05: qty 1

## 2013-09-05 MED ORDER — SODIUM CHLORIDE 0.9 % IV BOLUS (SEPSIS)
1000.0000 mL | Freq: Once | INTRAVENOUS | Status: AC
  Administered 2013-09-05: 1000 mL via INTRAVENOUS

## 2013-09-05 MED ORDER — IOHEXOL 300 MG/ML  SOLN
100.0000 mL | Freq: Once | INTRAMUSCULAR | Status: AC | PRN
  Administered 2013-09-05: 100 mL via INTRAVENOUS

## 2013-09-05 MED ORDER — ACETAMINOPHEN 325 MG PO TABS
650.0000 mg | ORAL_TABLET | Freq: Four times a day (QID) | ORAL | Status: DC | PRN
  Administered 2013-09-06: 650 mg via ORAL
  Filled 2013-09-05: qty 2

## 2013-09-05 MED ORDER — ZOLPIDEM TARTRATE 5 MG PO TABS
5.0000 mg | ORAL_TABLET | Freq: Every evening | ORAL | Status: DC | PRN
  Administered 2013-09-06 – 2013-09-08 (×3): 5 mg via ORAL
  Filled 2013-09-05 (×4): qty 1

## 2013-09-05 MED ORDER — DULOXETINE HCL 30 MG PO CPEP
30.0000 mg | ORAL_CAPSULE | Freq: Every evening | ORAL | Status: DC
  Administered 2013-09-05 – 2013-09-08 (×4): 30 mg via ORAL
  Filled 2013-09-05 (×5): qty 1

## 2013-09-05 MED ORDER — SODIUM CHLORIDE 0.9 % IV SOLN
INTRAVENOUS | Status: AC
  Administered 2013-09-05 (×2): via INTRAVENOUS

## 2013-09-05 NOTE — ED Notes (Signed)
Pt complains of epigastric pain with nausea and vomiting for two days, hx of hiatal hernia, pt states that she is unable to keep anything done

## 2013-09-05 NOTE — H&P (Signed)
[facility] History and Physical  [name] GQQ:[id number] DOB: 03-Oct-1945 DOA: 09/05/2013  Referring physician: ER physician PCP: Shirline Frees, MD   Chief Complaint: abdominal pain, nausea and vomiting  HPI:  68 year old female with past medical history of ovarian ca on chemotherapy (last chemo about 1 week prior to this admission given in [place]) who presented with worsening epigastric pain and associated nausea and non bloody vomiting for past few days prior to this admission. No reports of fevers or chills. Her abdominal pain is more of a discomfort and she describes it as intermittent. She denies having diarrhea. No reports of blood in stool. No lightheadedness or dizziness. No chest pain, palpitations or shortness of breath.   In ED, vitals are stable with BP 156/71, HR 92, T max 98.7 F and oxygen saturation 98% on room air. Blood work was unremarkable. CT abdomen showed likely some degree of small bowel dysmotility or possibly partial obstruction. Patient reported feeling somewhat better with Zofran and dilaudid given in ED. She was admitted for further evaluation and management. ED to consult surgery for further input on management.  Assessment and Plan:  Principal Problem:   SBO (small bowel obstruction), abdominal pain, nausea and vomiting - continue supportive care with IV fluids, NPO and antiemetics and analgesia as needed - appreciate surgery consult and recommendations  - continue protonix 40 mg IV daily Active Problems:   Anxiety and depression - continue cymbalta and Wellbutrin - added ativan 1 mg every 8 hours PRN   GERD (gastroesophageal reflux disease) - continue protonix   Ovarian ca - has had recent chemo; CBC unremarkable. - apparently has metastatic disease to lymph node (supraclavicular)/Virchow's node   Dyslipidemia - continue statin therapy    Radiological Exams on Admission: Ct Abdomen Pelvis W Contrast 09/05/2013     IMPRESSION: 1. Mild  diffuse distention of small bowel loops throughout the abdomen and pelvis, measuring up to 3.3 cm in diameter. This extends to the level of the ileoileal anastomosis at the right lower quadrant, which demonstrates slight associated fecalization. Findings likely reflect some degree of small bowel dysmotility or possibly partial obstruction; fluid within the distal ileum, cecum and ascending colon excludes high-grade obstruction. No definite transition point seen. 2. Trace free fluid noted surrounding small bowel loops, and tracking to the pelvis. This may reflect the small bowel process. 3. Scattered calcification along the abdominal aorta and its branches. 4. Small hiatal hernia seen. 5. Tiny periumbilical hernia to the left of midline, containing only fat. 6. Mild diffuse soft tissue edema noted along the proximal anterior left thigh, of uncertain significance.    Code Status: Full Family Communication: Pt at bedside Disposition Plan: Admit for further evaluation  Robbie Lis, MD  [facility] Pager [phone]  Review of Systems:  Constitutional: Negative for fever, chills and malaise/fatigue. Negative for diaphoresis.  HENT: Negative for hearing loss, ear pain, nosebleeds, congestion, sore throat, neck pain, tinnitus and ear discharge.   Eyes: Negative for blurred vision, double vision, photophobia, pain, discharge and redness.  Respiratory: Negative for cough, hemoptysis, sputum production, shortness of breath, wheezing and stridor.   Cardiovascular: Negative for chest pain, palpitations, orthopnea, claudication and leg swelling.  Gastrointestinal: per HPI.  Genitourinary: Negative for dysuria, urgency, frequency, hematuria and flank pain.  Musculoskeletal: Negative for myalgias, back pain, joint pain and falls.  Skin: Negative for itching and rash.  Neurological: Negative for dizziness and weakness. Negative for tingling, tremors, sensory change, speech change, focal weakness, loss of  consciousness and headaches.  Endo/Heme/Allergies: Negative for environmental allergies and polydipsia. Does not bruise/bleed easily.  Psychiatric/Behavioral: Negative for suicidal ideas. The patient is not nervous/anxious.      Past Medical History  Diagnosis Date  . Mitral valve prolapse 03-10-13    rare palpitations  . Anxiety   . Depression   . Lymph edema 03-10-13    left leg( hip to foot)-consistent-remains an issue 08-21-13  . GERD (gastroesophageal reflux disease)   . Seizures     '70- x1 post Phenergan IV for nausea during pregnancy  . Wrist fracture     11'14-casted only,no surgery-no problems now  . Nodule of neck 08-21-13    Chemotherapy to start this week for this  . Portacath in place 08-21-13    left chest remains  . Cancer     '10-Ovarian cancer(tx. surgery, chemotherapy) -residual lymph edema lt. leg,restart of chemotherapy this week-Dr. Verne Spurr)   Past Surgical History  Procedure Laterality Date  . Abdominal hysterectomy      '79-no cancer  . Laparotomy for staging / restaging      '10- [facility] (Dr.Nycum)-Dr. Pippitt, oncology- surgery Ovarian Cancer  . Appendectomy      '10  with Ovarian staging surgery  . Cataract extraction, bilateral Bilateral   . Tubal ligation    . Fracture surgery Right     '12 -ORIF Rt. shoulder  . Portacath placement      has left chest pac  . Angioplasty  2009  . Foot surgery      tendon many yrs ago   Social History:  reports that she quit smoking about 27 years ago. Her smoking use included Cigarettes. She has a 19.5 pack-year smoking history. She has never used smokeless tobacco. She reports that she does not drink alcohol or use illicit drugs.  Allergies  Allergen Reactions  . Clindamycin/Lincomycin Nausea And Vomiting  . Phenergan [Promethazine]     seizure  . Prochlorperazine     Restless Legs with compazine  . Sulfa Antibiotics Nausea And Vomiting  . Penicillins Rash    Family History: hypertension  in family  Prior to Admission medications   Medication Sig Start Date End Date Taking? Authorizing Provider  aspirin EC 81 MG tablet Take 81 mg by mouth daily.   Yes Historical Provider, MD  buPROPion (WELLBUTRIN XL) 300 MG 24 hr tablet Take 300 mg by mouth every morning.   Yes Historical Provider, MD  DULoxetine (CYMBALTA) 30 MG capsule Take 30 mg by mouth every evening.   Yes Historical Provider, MD  Ginger, Zingiber officinalis, (GINGER ROOT) 550 MG CAPS Take 1 capsule by mouth daily.   Yes Historical Provider, MD  LORazepam (ATIVAN) 1 MG tablet Take 1 mg by mouth 2 (two) times daily as needed for anxiety.   Yes Historical Provider, MD  Multiple Vitamin (MULTIVITAMIN WITH MINERALS) TABS tablet Take 1 tablet by mouth daily.   Yes Historical Provider, MD  pantoprazole (PROTONIX) 40 MG tablet Take 40 mg by mouth 2 (two) times daily.   Yes Historical Provider, MD  simvastatin (ZOCOR) 20 MG tablet Take 20 mg by mouth every evening.   Yes Historical Provider, MD  zolpidem (AMBIEN) 5 MG tablet Take 5 mg by mouth at bedtime as needed for sleep.   Yes Historical Provider, MD   Physical Exam: Filed Vitals:   09/05/13 0108 09/05/13 0323  BP: 186/77 156/71  Pulse: 92 96  Temp: 98.3 F (36.8 C) 98.7 F (37.1 C)  TempSrc:  Oral Oral  Resp: 22 18  SpO2: 98% 98%    Physical Exam  Constitutional: Appears well-developed and well-nourished. No distress.  HENT: Normocephalic. External right and left ear normal. Oropharynx is clear and moist.  Eyes: Conjunctivae and EOM are normal. PERRLA, no scleral icterus.  Neck: Normal ROM. Neck supple. No JVD. No tracheal deviation. No thyromegaly.  CVS: RRR, S1/S2 +, no murmurs, no gallops, no carotid bruit.  Pulmonary: Effort and breath sounds normal, no stridor, rhonchi, wheezes, rales.  Abdominal: Soft. BS +,  no distension, tenderness in upper and mid abdomen, no rebound or guarding.  Musculoskeletal: Normal range of motion. No edema and no tenderness.   Lymphadenopathy: No lymphadenopathy noted, cervical, inguinal. Neuro: Alert. Normal reflexes, muscle tone coordination. No cranial nerve deficit. Skin: Skin is warm and dry. No rash noted. Not diaphoretic. No erythema. No pallor.  Psychiatric: Normal mood and affect. Behavior, judgment, thought content normal.   Labs on Admission:  Basic Metabolic Panel:  Recent Labs Lab 09/05/13 0058  NA 137  K 3.7  CL 96  CO2 28  GLUCOSE 131*  BUN 12  CREATININE 1.09  CALCIUM 9.9   Liver Function Tests:  Recent Labs Lab 09/05/13 0058  AST 29  ALT 17  ALKPHOS 93  BILITOT 0.4  PROT 7.7  ALBUMIN 3.5    Recent Labs Lab 09/05/13 0058  LIPASE 34   No results found for this basename: AMMONIA,  in the last 168 hours CBC:  Recent Labs Lab 09/05/13 0058  WBC 7.4  NEUTROABS 5.6  HGB 12.4  HCT 37.4  MCV 84.8  PLT 183   Cardiac Enzymes: No results found for this basename: CKTOTAL, CKMB, CKMBINDEX, TROPONINI,  in the last 168 hours BNP: No components found with this basename: POCBNP,  CBG: No results found for this basename: GLUCAP,  in the last 168 hours  If 7PM-7AM, please contact night-coverage [email or web address] Password [facility] 09/05/2013, 4:17 AM

## 2013-09-05 NOTE — Progress Notes (Addendum)
Partial SBO admitted early this AM. Feeling better. Abdomen less distended, nausea resolved and has had a loose BM.  Abdomen is soft and non-distended- bowel sounds are absent.   A/P  Partial SBO - cont conservative management - cont IVF while NPO - f/u xray in AM  - diet per surgery service   Ovarian cancer - recurrence - outpt f/u for chemo   Caitlyn Rochin,MD [facility].

## 2013-09-05 NOTE — ED Notes (Signed)
Bed: TR71 Expected date: 09/04/13 Expected time: 11:50 PM Means of arrival: Ambulance Comments: n,v

## 2013-09-05 NOTE — ED Provider Notes (Signed)
CSN: [id number]     Arrival date & time 09/05/13  0001 History   First MD Initiated Contact with Patient 09/05/13 0035     Chief Complaint  Patient presents with  . Emesis  . Nausea  . Abdominal Pain     (Consider location/radiation/quality/duration/timing/severity/associated sxs/prior Treatment) HPI Comments: 68 year old female, history of ovarian cancer which has recently metastasized, she restarted her chemotherapy approximately one week ago and has developed abdominal discomfort in her epigastrium and periumbilical region with associated nausea and vomiting. This started yesterday, has been intermittent lasting for several minutes and then had reprieve for several minutes.  She has had no bowel movements and has not passed any gas over the last 24 hours. She denies a history of bowel obstruction and her recent diagnosis 2 months ago was made after CT scan of the chest abdomen and pelvis revealed some pulmonary nodules and a liver nodule but no other intra-abdominal pathology. She had a biopsy of a supraclavicular lymph node which has recently appeared which was positive for metastatic disease  Patient is a 68 y.o. female presenting with vomiting and abdominal pain. The history is provided by the patient.  Emesis Associated symptoms: abdominal pain   Abdominal Pain Associated symptoms: vomiting     Past Medical History  Diagnosis Date  . Mitral valve prolapse 03-10-13    rare palpitations  . Anxiety   . Depression   . Lymph edema 03-10-13    left leg( hip to foot)-consistent-remains an issue 08-21-13  . GERD (gastroesophageal reflux disease)   . Seizures     '70- x1 post Phenergan IV for nausea during pregnancy  . Wrist fracture     11'14-casted only,no surgery-no problems now  . Nodule of neck 08-21-13    Chemotherapy to start this week for this  . Portacath in place 08-21-13    left chest remains  . Cancer     '10-Ovarian cancer(tx. surgery, chemotherapy) -residual lymph  edema lt. leg,restart of chemotherapy this week-Dr. Verne Spurr)   Past Surgical History  Procedure Laterality Date  . Abdominal hysterectomy      '79-no cancer  . Laparotomy for staging / restaging      '10- [facility] (Dr.Nycum)-Dr. Pippitt, oncology- surgery Ovarian Cancer  . Appendectomy      '10  with Ovarian staging surgery  . Cataract extraction, bilateral Bilateral   . Tubal ligation    . Fracture surgery Right     '12 -ORIF Rt. shoulder  . Portacath placement      has left chest pac  . Angioplasty  2009  . Foot surgery      tendon many yrs ago   History reviewed. No pertinent family history. History  Substance Use Topics  . Smoking status: Former Smoker -- 1.50 packs/day for 13 years    Types: Cigarettes    Quit date: 03/10/1986  . Smokeless tobacco: Never Used  . Alcohol Use: No   OB History   Grav Para Term Preterm Abortions TAB SAB Ect Mult Living                 Review of Systems  Gastrointestinal: Positive for vomiting and abdominal pain.  All other systems reviewed and are negative.     Allergies  Clindamycin/lincomycin; Phenergan; Prochlorperazine; Sulfa antibiotics; and Penicillins  Home Medications   Prior to Admission medications   Medication Sig Start Date End Date Taking? Authorizing Provider  aspirin EC 81 MG tablet Take 81 mg by mouth daily.  Historical Provider, MD  buPROPion (WELLBUTRIN XL) 300 MG 24 hr tablet Take 300 mg by mouth every morning.    Historical Provider, MD  DULoxetine (CYMBALTA) 30 MG capsule Take 30 mg by mouth every evening.    Historical Provider, MD  Ginger, Zingiber officinalis, (GINGER ROOT) 550 MG CAPS Take 1 capsule by mouth daily.    Historical Provider, MD  LORazepam (ATIVAN) 1 MG tablet Take 1 mg by mouth 2 (two) times daily as needed for anxiety.    Historical Provider, MD  Multiple Vitamin (MULTIVITAMIN WITH MINERALS) TABS tablet Take 1 tablet by mouth daily.    Historical Provider, MD  pantoprazole  (PROTONIX) 40 MG tablet Take 40 mg by mouth 2 (two) times daily.    Historical Provider, MD  simvastatin (ZOCOR) 20 MG tablet Take 20 mg by mouth every evening.    Historical Provider, MD  zolpidem (AMBIEN) 5 MG tablet Take 5 mg by mouth at bedtime as needed for sleep.    Historical Provider, MD   BP 156/71  Pulse 96  Temp(Src) 98.7 F (37.1 C) (Oral)  Resp 18  SpO2 98% Physical Exam  Nursing note and vitals reviewed. Constitutional: She appears well-developed and well-nourished. No distress.  HENT:  Head: Normocephalic and atraumatic.  Mouth/Throat: No oropharyngeal exudate.  Mucous membranes mildly dehydrated  Eyes: Conjunctivae and EOM are normal. Pupils are equal, round, and reactive to light. Right eye exhibits no discharge. Left eye exhibits no discharge. No scleral icterus.  Neck: Normal range of motion. Neck supple. No JVD present. No thyromegaly present.  Cardiovascular: Normal rate, regular rhythm, normal heart sounds and intact distal pulses.  Exam reveals no gallop and no friction rub.   No murmur heard. Pulmonary/Chest: Effort normal and breath sounds normal. No respiratory distress. She has no wheezes. She has no rales.  Abdominal: Soft. Bowel sounds are normal. She exhibits no distension and no mass. There is tenderness (epigastric and right upper quadrant tenderness, no guarding or masses).  No tympanitic sounds to percussion, no peritoneal sign  Musculoskeletal: Normal range of motion. She exhibits no edema and no tenderness.  Lymphadenopathy:    She has no cervical adenopathy.  Neurological: She is alert. Coordination normal.  Skin: Skin is warm and dry. No rash noted. No erythema.  Psychiatric: She has a normal mood and affect. Her behavior is normal.    ED Course  Procedures (including critical care time) Labs Review Labs Reviewed  COMPREHENSIVE METABOLIC PANEL - Abnormal; Notable for the following:    Glucose, Bld 131 (*)    GFR calc non Af Amer 52 (*)     GFR calc Af Amer 60 (*)    All other components within normal limits  URINALYSIS, ROUTINE W REFLEX MICROSCOPIC - Abnormal; Notable for the following:    Ketones, ur 40 (*)    All other components within normal limits  CBC WITH DIFFERENTIAL  LIPASE, BLOOD    Imaging Review Ct Abdomen Pelvis W Contrast  09/05/2013   CLINICAL DATA:  Epigastric abdominal pain, nausea and vomiting.  EXAM: CT ABDOMEN AND PELVIS WITH CONTRAST  TECHNIQUE: Multidetector CT imaging of the abdomen and pelvis was performed using the standard protocol following bolus administration of intravenous contrast.  CONTRAST:  143mL OMNIPAQUE IOHEXOL 300 MG/ML  SOLN  COMPARISON:  CT of the abdomen and pelvis from 06/08/2008  FINDINGS: The visualized lung bases are clear. A small hiatal hernia is noted.  The liver and spleen are unremarkable in appearance. The gallbladder  is within normal limits. The pancreas and adrenal glands are unremarkable.  The kidneys are unremarkable in appearance. There is no evidence of hydronephrosis. No renal or ureteral stones are seen. No perinephric stranding is appreciated.  There is mild diffuse distention of small bowel loops throughout the abdomen and pelvis, measuring up to 3.3 cm in diameter. This extends to the level of the patient's ileoileal anastomosis at the right lower quadrant, which demonstrates slight associated fecalization. This likely reflects some degree of small bowel dysmotility, or possibly partial obstruction; fluid is seen within the distal ileum, cecum and ascending colon. There is no evidence of high-grade obstruction. No definite transition point is seen.  Trace free fluid is noted surrounding small bowel loops and tracking to the pelvis.  The stomach is within normal limits. No acute vascular abnormalities are seen. Scattered calcification is seen along the abdominal aorta and its branches. A tiny periumbilical hernia is noted to the left of midline, containing only fat.  The patient  is status post appendectomy. Fluid is seen filling the cecum and ascending colon. The remainder of the colon is decompressed. Clips are noted at the left pelvic sidewall.  The bladder is decompressed and not well assessed. The patient is status post hysterectomy. No suspicious adnexal masses are seen. No inguinal lymphadenopathy is seen.  Mild diffuse soft tissue edema is noted along the proximal anterior left thigh.  No acute osseous abnormalities are identified. There is mild chronic loss of height at L4.  IMPRESSION: 1. Mild diffuse distention of small bowel loops throughout the abdomen and pelvis, measuring up to 3.3 cm in diameter. This extends to the level of the ileoileal anastomosis at the right lower quadrant, which demonstrates slight associated fecalization. Findings likely reflect some degree of small bowel dysmotility or possibly partial obstruction; fluid within the distal ileum, cecum and ascending colon excludes high-grade obstruction. No definite transition point seen. 2. Trace free fluid noted surrounding small bowel loops, and tracking to the pelvis. This may reflect the small bowel process. 3. Scattered calcification along the abdominal aorta and its branches. 4. Small hiatal hernia seen. 5. Tiny periumbilical hernia to the left of midline, containing only fat. 6. Mild diffuse soft tissue edema noted along the proximal anterior left thigh, of uncertain significance.   Electronically Signed   By: Garald Balding M.D.   On: 09/05/2013 03:39     EKG Interpretation None      MDM   Final diagnoses:  Small bowel obstruction    The patient has abdominal pain with nausea and vomiting, reproducible tenderness and no bowel motility, decreased bowel sounds, possible bowel obstruction, but also consider that this is related to her hiatal hernia, pancreatitis, cholecystitis or related to chemotherapy. CT scan pending the labs ordered.  CT scan reveals a partial small bowel obstruction, no other  significant findings, laboratory workup unremarkable, patient has remained nauseated, may improve with NG tube. Discussed with hospitalist who will admit, recommendation from hospitalist to talk to surgery, the patient is not surgical at this time, will make a consult call at their request.  Meds given in ED:  Medications  HYDROmorphone (DILAUDID) injection 1 mg (1 mg Intravenous Given 09/05/13 0111)  ondansetron (ZOFRAN) injection 4 mg (4 mg Intravenous Given 09/05/13 0111)  sodium chloride 0.9 % bolus 1,000 mL (0 mLs Intravenous Stopped 09/05/13 0156)  iohexol (OMNIPAQUE) 300 MG/ML solution 50 mL (50 mLs Oral Contrast Given 09/05/13 0111)  iohexol (OMNIPAQUE) 300 MG/ML solution 100 mL (100 mLs  Intravenous Contrast Given 09/05/13 0239)       Johnna Acosta, MD 09/05/13 [phone]

## 2013-09-05 NOTE — Consult Note (Signed)
Reason for Consult:  Partial SBO Referring Physician: Dr. Noemi Chapel  [name] is an 69 y.o. female.  HPI: She presented to the emergency department earlier this morning because of increasing epigastric abdominal pain associated with nausea and vomiting. She ate a 7 layer salad on $Remove"Sunday. Following that, she began having aching upper abdominal pain that came in waves. The pain progressively worsened and became associated with nausea and vomiting. She had a bowel movement later that day. The pain became so severe that she presented to the emergency department early this morning. She was evaluated there. CT scan demonstrated some dilated small bowel loops down to an area of a previous small bowel anastomosis. There was fluid and gas distal to this. There is a small amount of free fluid. No pneumoperitoneum. This was suggestive of a possible small bowel dysmotility versus possible partial small bowel obstruction. We were asked to see her because of this. She recently has had a small bowel movement-since she was admitted.  She has a history of ovarian cancer and underwent a TAH/BSO with a small bowel resection. This was back in 2010. She recently has been discovered to have metastatic cancer. She had her chemotherapy treatment approximately one week ago. She has had chemotherapy in the past. She says it constipates her.  Past Medical History  Diagnosis Date  . Mitral valve prolapse 03-10-13    rare palpitations  . Anxiety   . Depression   . Lymph edema 03-10-13    left leg( hip to foot)-consistent-remains an issue 08-21-13  . GERD (gastroesophageal reflux disease)   . Seizures     '70- x1 post Phenergan IV for nausea during pregnancy  . Wrist fracture     11"DiYYhVu$ '14-casted only,no surgery-no problems now  . Nodule of neck 08-21-13    Chemotherapy to start this week for this  . Portacath in place 08-21-13    left chest remains  . Cancer     '10-Ovarian cancer(tx. surgery, chemotherapy) -residual  lymph edema lt. leg,restart of chemotherapy this week-Dr. Verne Spurr)    Past Surgical History  Procedure Laterality Date  . Abdominal hysterectomy      '79-no cancer  . Laparotomy for staging / restaging      '10- [facility] (Dr.Nycum)-Dr. Pippitt, oncology- surgery Ovarian Cancer  . Appendectomy      '10  with Ovarian staging surgery  . Cataract extraction, bilateral Bilateral   . Tubal ligation    . Fracture surgery Right     '12 -ORIF Rt. shoulder  . Portacath placement      has left chest pac  . Angioplasty  2009  . Foot surgery      tendon many yrs ago    History reviewed. No pertinent family history.  Social History:  reports that she quit smoking about 27 years ago. Her smoking use included Cigarettes. She has a 19.5 pack-year smoking history. She has never used smokeless tobacco. She reports that she does not drink alcohol or use illicit drugs.  Allergies:  Allergies  Allergen Reactions  . Clindamycin/Lincomycin Nausea And Vomiting  . Phenergan [Promethazine]     seizure  . Prochlorperazine     Restless Legs with compazine  . Sulfa Antibiotics Nausea And Vomiting  . Penicillins Rash    Prior to Admission medications   Medication Sig Start Date End Date Taking? Authorizing Provider  aspirin EC 81 MG tablet Take 81 mg by mouth daily.   Yes Historical Provider, MD  buPROPion [facility]  XL) 300 MG 24 hr tablet Take 300 mg by mouth every morning.   Yes Historical Provider, MD  DULoxetine (CYMBALTA) 30 MG capsule Take 30 mg by mouth every evening.   Yes Historical Provider, MD  Ginger, Zingiber officinalis, (GINGER ROOT) 550 MG CAPS Take 1 capsule by mouth daily.   Yes Historical Provider, MD  LORazepam (ATIVAN) 1 MG tablet Take 1 mg by mouth 2 (two) times daily as needed for anxiety.   Yes Historical Provider, MD  Multiple Vitamin (MULTIVITAMIN WITH MINERALS) TABS tablet Take 1 tablet by mouth daily.   Yes Historical Provider, MD  pantoprazole (PROTONIX) 40  MG tablet Take 40 mg by mouth 2 (two) times daily.   Yes Historical Provider, MD  simvastatin (ZOCOR) 20 MG tablet Take 20 mg by mouth every evening.   Yes Historical Provider, MD  zolpidem (AMBIEN) 5 MG tablet Take 5 mg by mouth at bedtime as needed for sleep.   Yes Historical Provider, MD     Results for orders placed during the hospital encounter of 09/05/13 (from the past 48 hour(s))  CBC WITH DIFFERENTIAL     Status: None   Collection Time    09/05/13 12:58 AM      Result Value Ref Range   WBC 7.4  4.0 - 10.5 K/uL   RBC 4.41  3.87 - 5.11 MIL/uL   Hemoglobin 12.4  12.0 - 15.0 g/dL   HCT 37.4  36.0 - 46.0 %   MCV 84.8  78.0 - 100.0 fL   MCH 28.1  26.0 - 34.0 pg   MCHC 33.2  30.0 - 36.0 g/dL   RDW 14.2  11.5 - 15.5 %   Platelets 183  150 - 400 K/uL   Neutrophils Relative % 75  43 - 77 %   Neutro Abs 5.6  1.7 - 7.7 K/uL   Lymphocytes Relative 20  12 - 46 %   Lymphs Abs 1.5  0.7 - 4.0 K/uL   Monocytes Relative 5  3 - 12 %   Monocytes Absolute 0.4  0.1 - 1.0 K/uL   Eosinophils Relative 0  0 - 5 %   Eosinophils Absolute 0.0  0.0 - 0.7 K/uL   Basophils Relative 0  0 - 1 %   Basophils Absolute 0.0  0.0 - 0.1 K/uL  COMPREHENSIVE METABOLIC PANEL     Status: Abnormal   Collection Time    09/05/13 12:58 AM      Result Value Ref Range   Sodium 137  137 - 147 mEq/L   Potassium 3.7  3.7 - 5.3 mEq/L   Chloride 96  96 - 112 mEq/L   CO2 28  19 - 32 mEq/L   Glucose, Bld 131 (*) 70 - 99 mg/dL   BUN 12  6 - 23 mg/dL   Creatinine, Ser 1.09  0.50 - 1.10 mg/dL   Calcium 9.9  8.4 - 10.5 mg/dL   Total Protein 7.7  6.0 - 8.3 g/dL   Albumin 3.5  3.5 - 5.2 g/dL   AST 29  0 - 37 U/L   ALT 17  0 - 35 U/L   Alkaline Phosphatase 93  39 - 117 U/L   Total Bilirubin 0.4  0.3 - 1.2 mg/dL   GFR calc non Af Amer 52 (*) >90 mL/min   GFR calc Af Amer 60 (*) >90 mL/min   Comment: (NOTE)     The eGFR has been calculated using the CKD EPI equation.  This calculation has not been validated in all  clinical situations.     eGFR's persistently <90 mL/min signify possible Chronic Kidney     Disease.  LIPASE, BLOOD     Status: None   Collection Time    09/05/13 12:58 AM      Result Value Ref Range   Lipase 34  11 - 59 U/L  URINALYSIS, ROUTINE W REFLEX MICROSCOPIC     Status: Abnormal   Collection Time    09/05/13  2:04 AM      Result Value Ref Range   Color, Urine YELLOW  YELLOW   APPearance CLEAR  CLEAR   Specific Gravity, Urine 1.022  1.005 - 1.030   pH 5.5  5.0 - 8.0   Glucose, UA NEGATIVE  NEGATIVE mg/dL   Hgb urine dipstick NEGATIVE  NEGATIVE   Bilirubin Urine NEGATIVE  NEGATIVE   Ketones, ur 40 (*) NEGATIVE mg/dL   Protein, ur NEGATIVE  NEGATIVE mg/dL   Urobilinogen, UA 0.2  0.0 - 1.0 mg/dL   Nitrite NEGATIVE  NEGATIVE   Leukocytes, UA NEGATIVE  NEGATIVE   Comment: MICROSCOPIC NOT DONE ON URINES WITH NEGATIVE PROTEIN, BLOOD, LEUKOCYTES, NITRITE, OR GLUCOSE <1000 mg/dL.  CBC     Status: Abnormal   Collection Time    09/05/13  7:00 AM      Result Value Ref Range   WBC 6.3  4.0 - 10.5 K/uL   RBC 4.07  3.87 - 5.11 MIL/uL   Hemoglobin 11.2 (*) 12.0 - 15.0 g/dL   HCT 34.8 (*) 36.0 - 46.0 %   MCV 85.5  78.0 - 100.0 fL   MCH 27.5  26.0 - 34.0 pg   MCHC 32.2  30.0 - 36.0 g/dL   RDW 14.2  11.5 - 15.5 %   Platelets 156  150 - 400 K/uL  GLUCOSE, CAPILLARY     Status: Abnormal   Collection Time    09/05/13  7:09 AM      Result Value Ref Range   Glucose-Capillary 130 (*) 70 - 99 mg/dL   Comment 1 Notify RN      Ct Abdomen Pelvis W Contrast  09/05/2013   CLINICAL DATA:  Epigastric abdominal pain, nausea and vomiting.  EXAM: CT ABDOMEN AND PELVIS WITH CONTRAST  TECHNIQUE: Multidetector CT imaging of the abdomen and pelvis was performed using the standard protocol following bolus administration of intravenous contrast.  CONTRAST:  170mL OMNIPAQUE IOHEXOL 300 MG/ML  SOLN  COMPARISON:  CT of the abdomen and pelvis from 06/08/2008  FINDINGS: The visualized lung bases are clear. A  small hiatal hernia is noted.  The liver and spleen are unremarkable in appearance. The gallbladder is within normal limits. The pancreas and adrenal glands are unremarkable.  The kidneys are unremarkable in appearance. There is no evidence of hydronephrosis. No renal or ureteral stones are seen. No perinephric stranding is appreciated.  There is mild diffuse distention of small bowel loops throughout the abdomen and pelvis, measuring up to 3.3 cm in diameter. This extends to the level of the patient's ileoileal anastomosis at the right lower quadrant, which demonstrates slight associated fecalization. This likely reflects some degree of small bowel dysmotility, or possibly partial obstruction; fluid is seen within the distal ileum, cecum and ascending colon. There is no evidence of high-grade obstruction. No definite transition point is seen.  Trace free fluid is noted surrounding small bowel loops and tracking to the pelvis.  The stomach is within normal limits. No acute  vascular abnormalities are seen. Scattered calcification is seen along the abdominal aorta and its branches. A tiny periumbilical hernia is noted to the left of midline, containing only fat.  The patient is status post appendectomy. Fluid is seen filling the cecum and ascending colon. The remainder of the colon is decompressed. Clips are noted at the left pelvic sidewall.  The bladder is decompressed and not well assessed. The patient is status post hysterectomy. No suspicious adnexal masses are seen. No inguinal lymphadenopathy is seen.  Mild diffuse soft tissue edema is noted along the proximal anterior left thigh.  No acute osseous abnormalities are identified. There is mild chronic loss of height at L4.  IMPRESSION: 1. Mild diffuse distention of small bowel loops throughout the abdomen and pelvis, measuring up to 3.3 cm in diameter. This extends to the level of the ileoileal anastomosis at the right lower quadrant, which demonstrates slight  associated fecalization. Findings likely reflect some degree of small bowel dysmotility or possibly partial obstruction; fluid within the distal ileum, cecum and ascending colon excludes high-grade obstruction. No definite transition point seen. 2. Trace free fluid noted surrounding small bowel loops, and tracking to the pelvis. This may reflect the small bowel process. 3. Scattered calcification along the abdominal aorta and its branches. 4. Small hiatal hernia seen. 5. Tiny periumbilical hernia to the left of midline, containing only fat. 6. Mild diffuse soft tissue edema noted along the proximal anterior left thigh, of uncertain significance.   Electronically Signed   By: Garald Balding M.D.   On: 09/05/2013 03:39    Review of Systems  Constitutional: Negative for fever and chills.  Cardiovascular: Positive for leg swelling.  Gastrointestinal: Positive for heartburn, nausea, vomiting, abdominal pain and constipation. Negative for diarrhea.   Blood pressure 145/65, pulse 86, temperature 98.7 F (37.1 C), temperature source Oral, resp. rate 16, SpO2 98.00%. Physical Exam  Constitutional: She appears well-developed and well-nourished. No distress.  HENT:  Head: Normocephalic and atraumatic.  Eyes: No scleral icterus.  Neck:  Left supraclavicular mass  Cardiovascular: Normal rate and regular rhythm.   GI: Soft. She exhibits no distension and no mass. There is no tenderness.  Hypoactive bowel tones are present. No palpable hernias.  Genitourinary:  No palpable inguinal hernias.  Musculoskeletal: She exhibits edema (Left lower extremity).  Neurological: She is alert.  Skin: Skin is warm and dry.  Psychiatric: She has a normal mood and affect. Her behavior is normal.    Assessment/Plan: Epigastric abdominal pain with nausea and vomiting. CT scan suggested for possible partial small bowel structure versus dysmotility which could be ileus secondary to the chemotherapy.  She had a very small  bowel movement this morning.  Recommendation: Bowel rest, IV fluid hydration, repeat x-rays tomorrow.  Rhunette Croft Azaleah Usman 09/05/2013, 7:22 AM

## 2013-09-06 ENCOUNTER — Inpatient Hospital Stay (HOSPITAL_COMMUNITY): Payer: Medicare Other

## 2013-09-06 DIAGNOSIS — E785 Hyperlipidemia, unspecified: Secondary | ICD-10-CM | POA: Diagnosis not present

## 2013-09-06 DIAGNOSIS — R109 Unspecified abdominal pain: Secondary | ICD-10-CM | POA: Diagnosis not present

## 2013-09-06 DIAGNOSIS — K219 Gastro-esophageal reflux disease without esophagitis: Secondary | ICD-10-CM | POA: Diagnosis not present

## 2013-09-06 DIAGNOSIS — F329 Major depressive disorder, single episode, unspecified: Secondary | ICD-10-CM | POA: Diagnosis not present

## 2013-09-06 DIAGNOSIS — K56609 Unspecified intestinal obstruction, unspecified as to partial versus complete obstruction: Secondary | ICD-10-CM | POA: Diagnosis not present

## 2013-09-06 LAB — GLUCOSE, CAPILLARY: Glucose-Capillary: 68 mg/dL — ABNORMAL LOW (ref 70–99)

## 2013-09-06 MED ORDER — BISACODYL 10 MG RE SUPP
10.0000 mg | Freq: Once | RECTAL | Status: AC
  Administered 2013-09-06: 10 mg via RECTAL
  Filled 2013-09-06: qty 1

## 2013-09-06 MED ORDER — DOCUSATE SODIUM 100 MG PO CAPS
100.0000 mg | ORAL_CAPSULE | Freq: Every day | ORAL | Status: DC
  Administered 2013-09-08: 100 mg via ORAL
  Filled 2013-09-06 (×4): qty 1

## 2013-09-06 NOTE — Progress Notes (Signed)
[facility]  Agree with assessment by Saverio Danker.  Will give trial of clear liquids.  AXR reviewed and shows some improvement.  Will follow.  Earnstine Regal, MD, [facility] Office: [phone]

## 2013-09-06 NOTE — Progress Notes (Signed)
PROGRESS NOTE  [name] HDQ:[id number] DOB: 1946/01/27 DOA: 09/05/2013 PCP: Shirline Frees, MD  Assessment/Plan: PSBO  - continue supportive care with IV fluids,  -Advanced to clear liquids today -Passing flatus, but no bowel movement -antiemetics and analgesia as needed  - appreciate surgery consult and recommendations  - continue protonix 40 mg IV daily  -09/06/2013 abdominal x-ray shows mild bowel dilatation with scattered air-fluid levels, moderate stool in colon -optimize electrolytes-->am BMP Constipation -This has been a recurrent problem that the patient's chemotherapy -Last chemotherapy 08/24/2013 -Patient has agreed to try Dulcolax suppository after initial refusal -TSH Anxiety and depression  - continue cymbalta and Wellbutrin  - added ativan 1 mg every 8 hours PRN  GERD (gastroesophageal reflux disease)  - continue protonix  Metastatic Ovarian ca  - has had recent chemo; CBC unremarkable.  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  EXAM: CT ABDOMEN AND PELVIS WITH CONTRAST  TECHNIQUE: Multidetector CT imaging of the abdomen and pelvis was performed using the standard protocol following bolus administration of intravenous contrast.  CONTRAST:  13mL OMNIPAQUE IOHEXOL 300 MG/ML  SOLN  COMPARISON:  CT of the abdomen and pelvis from 06/08/2008  FINDINGS: The visualized lung bases are clear. A small hiatal hernia is noted.  The liver and spleen are unremarkable in appearance. The gallbladder is within normal limits. The pancreas and adrenal glands are unremarkable.  The kidneys are unremarkable in  appearance. There is no evidence of hydronephrosis. No renal or ureteral stones are seen. No perinephric stranding is appreciated.  There is mild diffuse distention of small bowel loops throughout the abdomen and pelvis, measuring up to 3.3 cm in diameter. This extends to the level of the patient's ileoileal anastomosis at the right lower quadrant, which demonstrates slight associated fecalization. This likely reflects some degree of small bowel dysmotility, or possibly partial obstruction; fluid is seen within the distal ileum, cecum and ascending colon. There is no evidence of high-grade obstruction. No definite transition point is seen.  Trace free fluid is noted surrounding small bowel loops and tracking to the pelvis.  The stomach is within normal limits. No acute vascular abnormalities are seen. Scattered calcification is seen along the abdominal aorta and its branches. A tiny periumbilical hernia is noted to the left of midline, containing only fat.  The patient is status post appendectomy. Fluid is seen filling the cecum and ascending colon. The remainder of the colon is decompressed. Clips are noted at the left pelvic sidewall.  The bladder is decompressed and not well assessed. The patient is status post hysterectomy. No suspicious adnexal masses are seen. No inguinal lymphadenopathy is seen.  Mild diffuse soft tissue edema is noted along the proximal anterior left thigh.  No acute osseous abnormalities are identified. There is mild chronic loss of height at L4.  IMPRESSION: 1. Mild diffuse distention of small bowel loops throughout the abdomen and pelvis, measuring up to 3.3 cm in diameter. This extends to the level of the ileoileal anastomosis at the right lower quadrant, which demonstrates slight associated fecalization. Findings likely reflect some degree of small  bowel dysmotility or possibly partial obstruction; fluid within the distal ileum, cecum and ascending colon excludes high-grade obstruction.  No definite transition point seen. 2. Trace free fluid noted surrounding small bowel loops, and tracking to the pelvis. This may reflect the small bowel process. 3. Scattered calcification along the abdominal aorta and its branches. 4. Small hiatal hernia seen. 5. Tiny periumbilical hernia to the left of midline, containing only fat. 6. Mild diffuse soft tissue edema noted along the proximal anterior left thigh, of uncertain significance.   Electronically Signed   By: Garald Balding M.D.   On: 09/05/2013 03:39   Dg Abd 2 Views  09/06/2013   CLINICAL DATA:  Abdominal pain with nausea ; bowel obstruction  EXAM: ABDOMEN - 2 VIEW  COMPARISON:  CT abdomen and pelvis September 05, 2013  FINDINGS: Supine and upright images were obtained. There is mild bowel dilatation in the right lower quadrant. No other bowel dilatation is appreciable. There are scattered air-fluid levels. No free air. There is increased opacity in the gallbladder, probably due to recent intravenous contrast administration. There is lumbar dextroscoliosis. There are multiple surgical clips on the left.  IMPRESSION: There is mild bowel dilatation with scattered air-fluid levels. A degree of obstruction is felt to remain. Air is seen in the rectum. There is moderate stool in the colon. No free air. Contrast in the gallbladder is noted.   Electronically Signed   By: Lowella Grip M.D.   On: 09/06/2013 09:06         Subjective: The patient states that abdominal pain is at least 50% better. He is passing flatus but no bowel movement today. Denies nausea, vomiting, chest pain, shortness breath, dysuria, hematuria, fevers, chills, headache, dizziness.   Objective: Filed Vitals:   09/05/13 1408 09/05/13 2138 09/06/13 0520 09/06/13 1400  BP: 135/72 159/72 126/74 135/63  Pulse: 80 76 88 85  Temp: 98.5 F (36.9 C) 98.4 F (36.9 C) 98.7 F (37.1 C) 98.4 F (36.9 C)  TempSrc: Oral Oral Oral Oral  Resp: 18 18 18 18   Weight:   74.254 kg (163  lb 11.2 oz)   SpO2: 99% 97% 97% 97%    Intake/Output Summary (Last 24 hours) at 09/06/13 1735 Last data filed at 09/06/13 1300  Gross per 24 hour  Intake    320 ml  Output    751 ml  Net   -431 ml   Weight change:  Exam:   General:  Pt is alert, follows commands appropriately, not in acute distress  HEENT: No icterus, No thrush,  [place]/AT  Cardiovascular: RRR, S1/S2, no rubs, no gallops  Respiratory: CTA bilaterally, no wheezing, no crackles, no rhonchi  Abdomen: Soft/+BS, non tender, non distended, no guarding  Extremities: 3+LLE edema, No lymphangitis, No petechiae, No rashes, no synovitis  Data Reviewed: Basic Metabolic Panel:  Recent Labs Lab 09/05/13 0058 09/05/13 0700  NA 137 140  K 3.7 3.8  CL 96 102  CO2 28 27  GLUCOSE 131* 133*  BUN 12 12  CREATININE 1.09 1.07  CALCIUM 9.9 8.5   Liver Function Tests:  Recent Labs Lab 09/05/13 0058 09/05/13 0700  AST 29 25  ALT 17 15  ALKPHOS 93 80  BILITOT 0.4 0.4  PROT 7.7 6.6  ALBUMIN 3.5 3.0*    Recent Labs Lab 09/05/13 0058  LIPASE 34   No results found for this basename: AMMONIA,  in the last 168 hours CBC:  Recent Labs Lab 09/05/13 0058 09/05/13 0700  WBC 7.4 6.3  NEUTROABS 5.6  --   HGB 12.4 11.2*  HCT 37.4 34.8*  MCV 84.8 85.5  PLT 183 156   Cardiac Enzymes: No results found for this basename: CKTOTAL, CKMB, CKMBINDEX, TROPONINI,  in the last 168 hours BNP: No components found with this basename: POCBNP,  CBG:  Recent Labs Lab 09/05/13 0709 09/06/13 0517  GLUCAP 130* 68*    No results found for this or any previous visit (from the past 240 hour(s)).   Scheduled Meds: . aspirin EC  81 mg Oral Daily  . bisacodyl  10 mg Rectal Once  . buPROPion  300 mg Oral q morning - 10a  . docusate sodium  100 mg Oral Daily  . DULoxetine  30 mg Oral QPM  . multivitamin with minerals  1 tablet Oral Daily  . pantoprazole (PROTONIX) IV  40 mg Intravenous Q24H  . simvastatin  20 mg Oral QPM     Continuous Infusions:    Orson Eva, DO  [facility] Pager [phone]  If 7PM-7AM, please contact night-coverage [email or web address] Password [email or web address] 09/06/2013, 5:35 PM   LOS: 1 day

## 2013-09-06 NOTE — Progress Notes (Signed)
Patient ID: [name], female   DOB: [birth year], 68 y.o.   MRN: [id number]    Subjective: Pt feels ok today, just sore, mostly in the LUQ.  Passing lots of fas.  Had a BM yesterday.  No further nasuea.  Objective: Vital signs in last 24 hours: Temp:  [98.4 F (36.9 C)-98.7 F (37.1 C)] 98.7 F (37.1 C) (04/29 0520) Pulse Rate:  [76-88] 88 (04/29 0520) Resp:  [18] 18 (04/29 0520) BP: (126-159)/(72-74) 126/74 mmHg (04/29 0520) SpO2:  [97 %-99 %] 97 % (04/29 0520) Weight:  [163 lb 11.2 oz (74.254 kg)] 163 lb 11.2 oz (74.254 kg) (04/29 0520) Last BM Date: 09/04/13  Intake/Output from previous day: 04/28 0701 - 04/29 0700 In: 700 [I.V.:700] Out: 454 [Urine:453; Stool:1] Intake/Output this shift:    PE: Abd: soft, tender in LUQ, +BS, ND  Lab Results:   Recent Labs  09/05/13 0058 09/05/13 0700  WBC 7.4 6.3  HGB 12.4 11.2*  HCT 37.4 34.8*  PLT 183 156   BMET  Recent Labs  09/05/13 0058 09/05/13 0700  NA 137 140  K 3.7 3.8  CL 96 102  CO2 28 27  GLUCOSE 131* 133*  BUN 12 12  CREATININE 1.09 1.07  CALCIUM 9.9 8.5   PT/INR No results found for this basename: LABPROT, INR,  in the last 72 hours CMP     Component Value Date/Time   NA 140 09/05/2013 0700   K 3.8 09/05/2013 0700   CL 102 09/05/2013 0700   CO2 27 09/05/2013 0700   GLUCOSE 133* 09/05/2013 0700   BUN 12 09/05/2013 0700   CREATININE 1.07 09/05/2013 0700   CALCIUM 8.5 09/05/2013 0700   PROT 6.6 09/05/2013 0700   ALBUMIN 3.0* 09/05/2013 0700   AST 25 09/05/2013 0700   ALT 15 09/05/2013 0700   ALKPHOS 80 09/05/2013 0700   BILITOT 0.4 09/05/2013 0700   GFRNONAA 53* 09/05/2013 0700   GFRAA 61* 09/05/2013 0700   Lipase     Component Value Date/Time   LIPASE 34 09/05/2013 0058       Studies/Results: Ct Abdomen Pelvis W Contrast  09/05/2013   CLINICAL DATA:  Epigastric abdominal pain, nausea and vomiting.  EXAM: CT ABDOMEN AND PELVIS WITH CONTRAST  TECHNIQUE: Multidetector CT imaging of the abdomen and  pelvis was performed using the standard protocol following bolus administration of intravenous contrast.  CONTRAST:  12mL OMNIPAQUE IOHEXOL 300 MG/ML  SOLN  COMPARISON:  CT of the abdomen and pelvis from 06/08/2008  FINDINGS: The visualized lung bases are clear. A small hiatal hernia is noted.  The liver and spleen are unremarkable in appearance. The gallbladder is within normal limits. The pancreas and adrenal glands are unremarkable.  The kidneys are unremarkable in appearance. There is no evidence of hydronephrosis. No renal or ureteral stones are seen. No perinephric stranding is appreciated.  There is mild diffuse distention of small bowel loops throughout the abdomen and pelvis, measuring up to 3.3 cm in diameter. This extends to the level of the patient's ileoileal anastomosis at the right lower quadrant, which demonstrates slight associated fecalization. This likely reflects some degree of small bowel dysmotility, or possibly partial obstruction; fluid is seen within the distal ileum, cecum and ascending colon. There is no evidence of high-grade obstruction. No definite transition point is seen.  Trace free fluid is noted surrounding small bowel loops and tracking to the pelvis.  The stomach is within normal limits. No acute vascular abnormalities are seen. Scattered  calcification is seen along the abdominal aorta and its branches. A tiny periumbilical hernia is noted to the left of midline, containing only fat.  The patient is status post appendectomy. Fluid is seen filling the cecum and ascending colon. The remainder of the colon is decompressed. Clips are noted at the left pelvic sidewall.  The bladder is decompressed and not well assessed. The patient is status post hysterectomy. No suspicious adnexal masses are seen. No inguinal lymphadenopathy is seen.  Mild diffuse soft tissue edema is noted along the proximal anterior left thigh.  No acute osseous abnormalities are identified. There is mild chronic  loss of height at L4.  IMPRESSION: 1. Mild diffuse distention of small bowel loops throughout the abdomen and pelvis, measuring up to 3.3 cm in diameter. This extends to the level of the ileoileal anastomosis at the right lower quadrant, which demonstrates slight associated fecalization. Findings likely reflect some degree of small bowel dysmotility or possibly partial obstruction; fluid within the distal ileum, cecum and ascending colon excludes high-grade obstruction. No definite transition point seen. 2. Trace free fluid noted surrounding small bowel loops, and tracking to the pelvis. This may reflect the small bowel process. 3. Scattered calcification along the abdominal aorta and its branches. 4. Small hiatal hernia seen. 5. Tiny periumbilical hernia to the left of midline, containing only fat. 6. Mild diffuse soft tissue edema noted along the proximal anterior left thigh, of uncertain significance.   Electronically Signed   By: Garald Balding M.D.   On: 09/05/2013 03:39    Anti-infectives: Anti-infectives   None       Assessment/Plan  1. PSBO 2. H/o ovarian cancer  Plan: 1. Will try clear liquids today given passage of flatus and no nausea.  Will review films today once they are completed as well.   LOS: 1 day    Henreitta Cea 09/06/2013, 8:05 AM Pager: [phone]

## 2013-09-07 DIAGNOSIS — E876 Hypokalemia: Secondary | ICD-10-CM

## 2013-09-07 DIAGNOSIS — E785 Hyperlipidemia, unspecified: Secondary | ICD-10-CM | POA: Diagnosis not present

## 2013-09-07 DIAGNOSIS — C569 Malignant neoplasm of unspecified ovary: Secondary | ICD-10-CM | POA: Diagnosis not present

## 2013-09-07 DIAGNOSIS — K56609 Unspecified intestinal obstruction, unspecified as to partial versus complete obstruction: Secondary | ICD-10-CM | POA: Diagnosis not present

## 2013-09-07 LAB — HEPATIC FUNCTION PANEL
ALT: 14 U/L (ref 0–35)
AST: 26 U/L (ref 0–37)
Albumin: 2.7 g/dL — ABNORMAL LOW (ref 3.5–5.2)
Alkaline Phosphatase: 60 U/L (ref 39–117)
BILIRUBIN TOTAL: 0.2 mg/dL — AB (ref 0.3–1.2)
Total Protein: 6 g/dL (ref 6.0–8.3)

## 2013-09-07 LAB — BASIC METABOLIC PANEL
BUN: 8 mg/dL (ref 6–23)
CALCIUM: 8.4 mg/dL (ref 8.4–10.5)
CO2: 29 mEq/L (ref 19–32)
Chloride: 102 mEq/L (ref 96–112)
Creatinine, Ser: 0.97 mg/dL (ref 0.50–1.10)
GFR calc Af Amer: 69 mL/min — ABNORMAL LOW (ref >90)
GFR, EST NON AFRICAN AMERICAN: 60 mL/min — AB (ref >90)
GLUCOSE: 92 mg/dL (ref 70–99)
POTASSIUM: 2.9 meq/L — AB (ref 3.7–5.3)
SODIUM: 140 meq/L (ref 137–147)

## 2013-09-07 LAB — CBC WITH DIFFERENTIAL/PLATELET
BASOS ABS: 0 10*3/uL (ref 0.0–0.1)
Basophils Relative: 0 % (ref 0–1)
EOS ABS: 0.1 10*3/uL (ref 0.0–0.7)
Eosinophils Relative: 2 % (ref 0–5)
HCT: 26.9 % — ABNORMAL LOW (ref 36.0–46.0)
Hemoglobin: 9.1 g/dL — ABNORMAL LOW (ref 12.0–15.0)
Lymphocytes Relative: 51 % — ABNORMAL HIGH (ref 12–46)
Lymphs Abs: 1.6 10*3/uL (ref 0.7–4.0)
MCH: 28.7 pg (ref 26.0–34.0)
MCHC: 33.8 g/dL (ref 30.0–36.0)
MCV: 84.9 fL (ref 78.0–100.0)
Monocytes Absolute: 0.2 10*3/uL (ref 0.1–1.0)
Monocytes Relative: 7 % (ref 3–12)
NEUTROS PCT: 41 % — AB (ref 43–77)
Neutro Abs: 1.3 10*3/uL — ABNORMAL LOW (ref 1.7–7.7)
PLATELETS: 84 10*3/uL — AB (ref 150–400)
RBC: 3.17 MIL/uL — ABNORMAL LOW (ref 3.87–5.11)
RDW: 14.3 % (ref 11.5–15.5)
WBC: 3.1 10*3/uL — AB (ref 4.0–10.5)

## 2013-09-07 LAB — GLUCOSE, CAPILLARY: GLUCOSE-CAPILLARY: 106 mg/dL — AB (ref 70–99)

## 2013-09-07 LAB — TSH: TSH: 3.19 u[IU]/mL (ref 0.350–4.500)

## 2013-09-07 LAB — CA 125: CA 125: 29.8 U/mL (ref 0.0–30.2)

## 2013-09-07 LAB — MAGNESIUM
Magnesium: 1.8 mg/dL (ref 1.5–2.5)
Magnesium: 1.8 mg/dL (ref 1.5–2.5)

## 2013-09-07 MED ORDER — POLYETHYLENE GLYCOL 3350 17 G PO PACK
17.0000 g | PACK | Freq: Once | ORAL | Status: AC
  Administered 2013-09-07: 17 g via ORAL
  Filled 2013-09-07: qty 1

## 2013-09-07 MED ORDER — POTASSIUM CHLORIDE 20 MEQ/15ML (10%) PO LIQD
40.0000 meq | Freq: Once | ORAL | Status: AC
  Administered 2013-09-07: 40 meq via ORAL
  Filled 2013-09-07: qty 30

## 2013-09-07 MED ORDER — POLYETHYLENE GLYCOL 3350 17 G PO PACK
17.0000 g | PACK | Freq: Every day | ORAL | Status: DC
  Administered 2013-09-07 – 2013-09-09 (×3): 17 g via ORAL
  Filled 2013-09-07 (×3): qty 1

## 2013-09-07 MED ORDER — POTASSIUM CHLORIDE 10 MEQ/100ML IV SOLN
10.0000 meq | INTRAVENOUS | Status: AC
  Administered 2013-09-07 (×4): 10 meq via INTRAVENOUS
  Filled 2013-09-07 (×4): qty 100

## 2013-09-07 NOTE — Progress Notes (Signed)
PROGRESS NOTE  [name]:[id number] DOB: 06-Jul-1945 DOA: 09/05/2013 PCP: Shirline Frees, MD  Assessment/Plan: PSBO  - continue supportive care with IV fluids,  -Advanced to full liquids today-->noted some increase in pain after meal but no vomiting  -Passing flatus, but no bowel movement-->passed some mucus  -antiemetics and analgesia as needed  - appreciate surgery consult and recommendations  - continue protonix 40 mg IV daily  -09/06/2013 abdominal x-ray shows mild bowel dilatation with scattered air-fluid levels, moderate stool in colon  -optimize electrolytes-->am BMP  Constipation  -This has been a recurrent problem that the patient's chemotherapy  -Last chemotherapy 08/24/2013  -Patient has agreed to try Dulcolax suppository-->passed mucus+flatus -TSH--3.190 Hypokalemia -Replete -Magnesium 1.8 Anxiety and depression  - continue cymbalta and Wellbutrin  - added ativan 1 mg every 8 hours PRN  GERD (gastroesophageal reflux disease)  - continue protonix  Metastatic Ovarian ca  - has had recent chemo; CBC unremarkable.  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  EXAM: CT ABDOMEN AND PELVIS WITH CONTRAST  TECHNIQUE: Multidetector CT imaging of the abdomen and pelvis was performed using the standard protocol following bolus administration of intravenous contrast.  CONTRAST:  18mL OMNIPAQUE IOHEXOL 300 MG/ML  SOLN  COMPARISON:  CT of the abdomen and pelvis from 06/08/2008  FINDINGS: The visualized lung bases are clear. A small hiatal hernia is noted.  The liver and spleen are unremarkable in appearance. The  gallbladder is within normal limits. The pancreas and adrenal glands are unremarkable.  The kidneys are unremarkable in appearance. There is no evidence of hydronephrosis. No renal or ureteral stones are seen. No perinephric stranding is appreciated.  There is mild diffuse distention of small bowel loops throughout the abdomen and pelvis, measuring up to 3.3 cm in diameter. This extends to the level of the patient's ileoileal anastomosis at the right lower quadrant, which demonstrates slight associated fecalization. This likely reflects some degree of small bowel dysmotility, or possibly partial obstruction; fluid is seen within the distal ileum, cecum and ascending colon. There is no evidence of high-grade obstruction. No definite transition point is seen.  Trace free fluid is noted surrounding small bowel loops and tracking to the pelvis.  The stomach is within normal limits. No acute vascular abnormalities are seen. Scattered calcification is seen along the abdominal aorta and its branches. A tiny periumbilical hernia is noted to the left of midline, containing only fat.  The patient is status post appendectomy. Fluid is seen filling the cecum and ascending colon. The remainder of the colon is decompressed. Clips are noted at the left pelvic sidewall.  The bladder is decompressed and not well assessed. The patient is status post hysterectomy. No suspicious adnexal masses are seen. No inguinal lymphadenopathy is seen.  Mild diffuse soft tissue edema is noted along the proximal anterior left thigh.  No acute osseous abnormalities are identified. There is mild chronic loss of height at L4.  IMPRESSION: 1. Mild diffuse distention of small bowel loops throughout the abdomen and pelvis, measuring up to 3.3 cm in diameter. This extends to the level of the ileoileal anastomosis at the right  lower quadrant, which demonstrates slight associated fecalization. Findings likely reflect some degree of small bowel dysmotility or  possibly partial obstruction; fluid within the distal ileum, cecum and ascending colon excludes high-grade obstruction. No definite transition point seen. 2. Trace free fluid noted surrounding small bowel loops, and tracking to the pelvis. This may reflect the small bowel process. 3. Scattered calcification along the abdominal aorta and its branches. 4. Small hiatal hernia seen. 5. Tiny periumbilical hernia to the left of midline, containing only fat. 6. Mild diffuse soft tissue edema noted along the proximal anterior left thigh, of uncertain significance.   Electronically Signed   By: Garald Balding M.D.   On: 09/05/2013 03:39   Dg Abd 2 Views  09/06/2013   CLINICAL DATA:  Abdominal pain with nausea ; bowel obstruction  EXAM: ABDOMEN - 2 VIEW  COMPARISON:  CT abdomen and pelvis September 05, 2013  FINDINGS: Supine and upright images were obtained. There is mild bowel dilatation in the right lower quadrant. No other bowel dilatation is appreciable. There are scattered air-fluid levels. No free air. There is increased opacity in the gallbladder, probably due to recent intravenous contrast administration. There is lumbar dextroscoliosis. There are multiple surgical clips on the left.  IMPRESSION: There is mild bowel dilatation with scattered air-fluid levels. A degree of obstruction is felt to remain. Air is seen in the rectum. There is moderate stool in the colon. No free air. Contrast in the gallbladder is noted.   Electronically Signed   By: Lowella Grip M.D.   On: 09/06/2013 09:06         Subjective: Patient passed flatus and some mucus with Dulcolax. She has not had any significant bowel movement. She noted some mild increase in abdominal pain with full liquids but there is no vomiting. Denies fevers, chills, chest pain, shortness breath, dysuria, hematuria, headache.  Objective: Filed Vitals:   09/06/13 1400 09/06/13 2230 09/07/13 0520 09/07/13 1413  BP: 135/63 133/70 130/54 121/64  Pulse: 85  86 88 84  Temp: 98.4 F (36.9 C) 98.8 F (37.1 C) 98.4 F (36.9 C) 98.3 F (36.8 C)  TempSrc: Oral Oral Oral Oral  Resp: 18 18 18 18   Weight:      SpO2: 97% 93% 92% 93%    Intake/Output Summary (Last 24 hours) at 09/07/13 1859 Last data filed at 09/07/13 1300  Gross per 24 hour  Intake    480 ml  Output      0 ml  Net    480 ml   Weight change:  Exam:   General:  Pt is alert, follows commands appropriately, not in acute distress  HEENT: No icterus, No thrush, [place]/AT  Cardiovascular: RRR, S1/S2, no rubs, no gallops  Respiratory: CTA bilaterally, no wheezing, no crackles, no rhonchi  Abdomen: Soft/+BS, mild periumbilical tenderness without any rebound or peritoneal sign, non distended, no guarding  Extremities: 2+RLE edema, No lymphangitis, No petechiae, No rashes, no synovitis  Data Reviewed: Basic Metabolic Panel:  Recent Labs Lab 09/05/13 0058 09/05/13 0700 09/07/13 0325 09/07/13 1220  NA 137 140 140  --   K 3.7 3.8 2.9*  --   CL 96 102 102  --   CO2 28 27 29   --   GLUCOSE 131* 133* 92  --   BUN 12 12 8   --   CREATININE 1.09 1.07 0.97  --   CALCIUM 9.9 8.5 8.4  --   MG  --   --  1.8 1.8  Liver Function Tests:  Recent Labs Lab 09/05/13 0058 09/05/13 0700 09/07/13 1220  AST 29 25 26   ALT 17 15 14   ALKPHOS 93 80 60  BILITOT 0.4 0.4 0.2*  PROT 7.7 6.6 6.0  ALBUMIN 3.5 3.0* 2.7*    Recent Labs Lab 09/05/13 0058  LIPASE 34   No results found for this basename: AMMONIA,  in the last 168 hours CBC:  Recent Labs Lab 09/05/13 0058 09/05/13 0700 09/07/13 1223  WBC 7.4 6.3 3.1*  NEUTROABS 5.6  --  1.3*  HGB 12.4 11.2* 9.1*  HCT 37.4 34.8* 26.9*  MCV 84.8 85.5 84.9  PLT 183 156 84*   Cardiac Enzymes: No results found for this basename: CKTOTAL, CKMB, CKMBINDEX, TROPONINI,  in the last 168 hours BNP: No components found with this basename: POCBNP,  CBG:  Recent Labs Lab 09/05/13 0709 09/06/13 0517 09/07/13 0746  GLUCAP 130* 68*  106*    No results found for this or any previous visit (from the past 240 hour(s)).   Scheduled Meds: . aspirin EC  81 mg Oral Daily  . buPROPion  300 mg Oral q morning - 10a  . docusate sodium  100 mg Oral Daily  . DULoxetine  30 mg Oral QPM  . multivitamin with minerals  1 tablet Oral Daily  . pantoprazole (PROTONIX) IV  40 mg Intravenous Q24H  . polyethylene glycol  17 g Oral Daily  . polyethylene glycol  17 g Oral Once  . potassium chloride  40 mEq Oral Once  . simvastatin  20 mg Oral QPM   Continuous Infusions:    Orson Eva, DO  [facility] Pager [phone]  If 7PM-7AM, please contact night-coverage [email or web address] Password [email or web address] 09/07/2013, 6:59 PM   LOS: 2 days

## 2013-09-07 NOTE — Progress Notes (Signed)
General Surgery [facility]  Patient seen and examined.  Improving clinically.  Tolerated full liquids for lunch.  Taking Miralax.  Will follow.  Earnstine Regal, MD, [facility] Office: [phone]

## 2013-09-07 NOTE — Progress Notes (Signed)
CRITICAL VALUE ALERT  Critical value received:  Potassium 2.9  Date of notification:  09/07/13  Time of notification:  0545  Critical value read back:yes  Nurse who received alert:  Edwyna Ready  MD notified (1st page): Tylene Fantasia   Time of first page: [phone]

## 2013-09-07 NOTE — Progress Notes (Signed)
Subjective: She is feeling better and tolerating clears fairly well.  She did have some results with dulcolax.  She is agreeable to full liquids and Miralax, she has issues with constipation from Chemotherapy.  She also has some labs her oncologist wants for follow up and he would like them today.  i will order for her.  Objective: Vital signs in last 24 hours: Temp:  [98.4 F (36.9 C)-98.8 F (37.1 C)] 98.4 F (36.9 C) (04/30 0520) Pulse Rate:  [85-88] 88 (04/30 0520) Resp:  [18] 18 (04/30 0520) BP: (130-135)/(54-70) 130/54 mmHg (04/30 0520) SpO2:  [92 %-97 %] 92 % (04/30 0520) Last BM Date: 09/04/13 320 PO yesterday recorded  Diet:  clears Afebrile, VSS K+ 2.9 No films today Intake/Output from previous day: 09-Sep-2022 0701 - 04/30 0700 In: 320 [P.O.:320] Out: 300 [Urine:300] Intake/Output this shift:    General appearance: alert, cooperative and no distress GI: soft, non-tender; bowel sounds normal; no masses,  no organomegaly  Lab Results:   Recent Labs  09/05/13 0058 09/05/13 0700  WBC 7.4 6.3  HGB 12.4 11.2*  HCT 37.4 34.8*  PLT 183 156    BMET  Recent Labs  09/05/13 0700 09/07/13 0325  NA 140 140  K 3.8 2.9*  CL 102 102  CO2 27 29  GLUCOSE 133* 92  BUN 12 8  CREATININE 1.07 0.97  CALCIUM 8.5 8.4   PT/INR No results found for this basename: LABPROT, INR,  in the last 72 hours   Recent Labs Lab 09/05/13 0058 09/05/13 0700  AST 29 25  ALT 17 15  ALKPHOS 93 80  BILITOT 0.4 0.4  PROT 7.7 6.6  ALBUMIN 3.5 3.0*     Lipase     Component Value Date/Time   LIPASE 34 09/05/2013 0058     Studies/Results: Dg Abd 2 Views  Sep 08, 2013   CLINICAL DATA:  Abdominal pain with nausea ; bowel obstruction  EXAM: ABDOMEN - 2 VIEW  COMPARISON:  CT abdomen and pelvis September 05, 2013  FINDINGS: Supine and upright images were obtained. There is mild bowel dilatation in the right lower quadrant. No other bowel dilatation is appreciable. There are scattered  air-fluid levels. No free air. There is increased opacity in the gallbladder, probably due to recent intravenous contrast administration. There is lumbar dextroscoliosis. There are multiple surgical clips on the left.  IMPRESSION: There is mild bowel dilatation with scattered air-fluid levels. A degree of obstruction is felt to remain. Air is seen in the rectum. There is moderate stool in the colon. No free air. Contrast in the gallbladder is noted.   Electronically Signed   By: Lowella Grip M.D.   On: 08-Sep-2013 09:06    Medications: . aspirin EC  81 mg Oral Daily  . buPROPion  300 mg Oral q morning - 10a  . docusate sodium  100 mg Oral Daily  . DULoxetine  30 mg Oral QPM  . multivitamin with minerals  1 tablet Oral Daily  . pantoprazole (PROTONIX) IV  40 mg Intravenous Q24H  . simvastatin  20 mg Oral QPM    Assessment/Plan 1. PSBO/constipation 2. H/o Metastatic ovarian cancer  3.  Anxiety/depression 4.  GERD 5.  Dyslipidemia  Plan:  Full liquids, Miralax and see how she does I have ordered the labs she requested for her oncologist.  She needs Ca 125, CMP, mag, and CBC with diff.  I would aim go get he K+ at 4.0 level. I do not see Heparin or  Lovenox for DVT, I have added SCD's.    LOS: 2 days    Earnstine Regal 09/07/2013

## 2013-09-08 DIAGNOSIS — K56609 Unspecified intestinal obstruction, unspecified as to partial versus complete obstruction: Secondary | ICD-10-CM | POA: Diagnosis not present

## 2013-09-08 DIAGNOSIS — E876 Hypokalemia: Secondary | ICD-10-CM | POA: Diagnosis not present

## 2013-09-08 DIAGNOSIS — E785 Hyperlipidemia, unspecified: Secondary | ICD-10-CM | POA: Diagnosis not present

## 2013-09-08 DIAGNOSIS — F329 Major depressive disorder, single episode, unspecified: Secondary | ICD-10-CM | POA: Diagnosis not present

## 2013-09-08 LAB — CBC
HCT: 27.8 % — ABNORMAL LOW (ref 36.0–46.0)
Hemoglobin: 9.1 g/dL — ABNORMAL LOW (ref 12.0–15.0)
MCH: 28.1 pg (ref 26.0–34.0)
MCHC: 32.7 g/dL (ref 30.0–36.0)
MCV: 85.8 fL (ref 78.0–100.0)
Platelets: 77 10*3/uL — ABNORMAL LOW (ref 150–400)
RBC: 3.24 MIL/uL — AB (ref 3.87–5.11)
RDW: 14.4 % (ref 11.5–15.5)
WBC: 3.3 10*3/uL — AB (ref 4.0–10.5)

## 2013-09-08 LAB — BASIC METABOLIC PANEL
BUN: 5 mg/dL — ABNORMAL LOW (ref 6–23)
CALCIUM: 8.6 mg/dL (ref 8.4–10.5)
CO2: 29 mEq/L (ref 19–32)
Chloride: 102 mEq/L (ref 96–112)
Creatinine, Ser: 0.95 mg/dL (ref 0.50–1.10)
GFR calc Af Amer: 71 mL/min — ABNORMAL LOW (ref >90)
GFR, EST NON AFRICAN AMERICAN: 61 mL/min — AB (ref >90)
Glucose, Bld: 87 mg/dL (ref 70–99)
Potassium: 3.8 mEq/L (ref 3.7–5.3)
SODIUM: 138 meq/L (ref 137–147)

## 2013-09-08 LAB — GLUCOSE, CAPILLARY: GLUCOSE-CAPILLARY: 81 mg/dL (ref 70–99)

## 2013-09-08 NOTE — Progress Notes (Signed)
  Subjective: She had a small Bm about 3 AM, tolerating full liquids well.  Passing gas, still feels a bit distended. No nausea or vomiting.  Objective: Vital signs in last 24 hours: Temp:  [98.3 F (36.8 C)-99.1 F (37.3 C)] 98.5 F (36.9 C) (05/01 1343) Pulse Rate:  [74-84] 74 (05/01 1343) Resp:  [16-18] 17 (05/01 1343) BP: (121-146)/(53-72) 146/72 mmHg (05/01 1343) SpO2:  [93 %-98 %] 98 % (05/01 1343) Last BM Date: 09/04/13 720 PO NO BM recorded Diet: full liquid Afebrile, VSS Labs OK , K+ up to 3.8  Intake/Output from previous day: 04/30 0701 - 05/01 0700 In: 720 [P.O.:720] Out: -  Intake/Output this shift: Total I/O In: 360 [P.O.:360] Out: -   General appearance: alert, cooperative and no distress GI: soft, non-tender; bowel sounds normal; no masses,  no organomegaly  Lab Results:   Recent Labs  09/07/13 1223 09/08/13 0415  WBC 3.1* 3.3*  HGB 9.1* 9.1*  HCT 26.9* 27.8*  PLT 84* 77*    BMET  Recent Labs  09/07/13 0325 09/08/13 0415  NA 140 138  K 2.9* 3.8  CL 102 102  CO2 29 29  GLUCOSE 92 87  BUN 8 5*  CREATININE 0.97 0.95  CALCIUM 8.4 8.6   PT/INR No results found for this basename: LABPROT, INR,  in the last 72 hours   Recent Labs Lab 09/05/13 0058 09/05/13 0700 09/07/13 1220  AST 29 25 26   ALT 17 15 14   ALKPHOS 93 80 60  BILITOT 0.4 0.4 0.2*  PROT 7.7 6.6 6.0  ALBUMIN 3.5 3.0* 2.7*     Lipase     Component Value Date/Time   LIPASE 34 09/05/2013 0058     Studies/Results: No results found.  Medications: . aspirin EC  81 mg Oral Daily  . buPROPion  300 mg Oral q morning - 10a  . docusate sodium  100 mg Oral Daily  . DULoxetine  30 mg Oral QPM  . multivitamin with minerals  1 tablet Oral Daily  . pantoprazole (PROTONIX) IV  40 mg Intravenous Q24H  . polyethylene glycol  17 g Oral Daily  . simvastatin  20 mg Oral QPM    Assessment/Plan 1. PSBO/constipation  2. H/o Metastatic ovarian cancer  3. Anxiety/depression   4. GERD  5. Dyslipidemia  Plan:  Soft diet, increase Miralax and see how she does.       LOS: 3 days    Earnstine Regal 09/08/2013

## 2013-09-08 NOTE — Progress Notes (Signed)
General Surgery [facility]  Patient seen and examined.  Seems to be improving.  Tolerating soft diet.  Continue Miralax.  AXR improving but not yet normal.  Will follow.  Earnstine Regal, MD, [facility] Office: [phone]

## 2013-09-08 NOTE — Progress Notes (Signed)
PROGRESS NOTE  [name]:[id number] DOB: 06/30/1945 DOA: 09/05/2013 PCP: Shirline Frees, MD  Assessment/Plan: PSBO  - continue supportive care with IV fluids,  -Advanced to full liquids 4/30 -advance to soft diet -Passing flatus, but no bowel movement-->passed some mucus  -antiemetics and analgesia as needed  - appreciate surgery consult and recommendations  - continue protonix 40 mg IV daily  -09/06/2013 abdominal x-ray shows mild bowel dilatation with scattered air-fluid levels, moderate stool in colon  -optimize electrolytes-->am BMP  Constipation  -This has been a recurrent problem that the patient's chemotherapy  -Last chemotherapy 08/24/2013  -Patient has agreed to try Dulcolax suppository-->passed mucus+flatus (4/30) -small loose BM on 09/08/13 -TSH--3.190  Hypokalemia  -Replete  -Magnesium 1.8  Anxiety and depression  - continue cymbalta and Wellbutrin  - added ativan 1 mg every 8 hours PRN  GERD (gastroesophageal reflux disease)  - continue protonix  Metastatic Ovarian ca  - has had recent chemo; CBC unremarkable.  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  EXAM: CT ABDOMEN AND PELVIS WITH CONTRAST  TECHNIQUE: Multidetector CT imaging of the abdomen and pelvis was performed using the standard protocol following bolus administration of intravenous contrast.  CONTRAST:  124mL OMNIPAQUE IOHEXOL 300 MG/ML  SOLN  COMPARISON:  CT of the abdomen and pelvis from 06/08/2008  FINDINGS: The visualized lung bases are clear. A small hiatal hernia is noted.  The liver and spleen are unremarkable in appearance.  The gallbladder is within normal limits. The pancreas and adrenal glands are unremarkable.  The kidneys are unremarkable in appearance. There is no evidence of hydronephrosis. No renal or ureteral stones are seen. No perinephric stranding is appreciated.  There is mild diffuse distention of small bowel loops throughout the abdomen and pelvis, measuring up to 3.3 cm in diameter. This extends to the level of the patient's ileoileal anastomosis at the right lower quadrant, which demonstrates slight associated fecalization. This likely reflects some degree of small bowel dysmotility, or possibly partial obstruction; fluid is seen within the distal ileum, cecum and ascending colon. There is no evidence of high-grade obstruction. No definite transition point is seen.  Trace free fluid is noted surrounding small bowel loops and tracking to the pelvis.  The stomach is within normal limits. No acute vascular abnormalities are seen. Scattered calcification is seen along the abdominal aorta and its branches. A tiny periumbilical hernia is noted to the left of midline, containing only fat.  The patient is status post appendectomy. Fluid is seen filling the cecum and ascending colon. The remainder of the colon is decompressed. Clips are noted at the left pelvic sidewall.  The bladder is decompressed and not well assessed. The patient is status post hysterectomy. No suspicious adnexal masses are seen. No inguinal lymphadenopathy is seen.  Mild diffuse soft tissue edema is noted along the proximal anterior left thigh.  No acute osseous abnormalities are identified. There is mild chronic loss of height at L4.  IMPRESSION: 1. Mild diffuse distention of small bowel loops throughout the abdomen and pelvis, measuring up to 3.3 cm in diameter. This extends to the level of  the ileoileal anastomosis at the right lower quadrant, which demonstrates slight associated fecalization. Findings likely reflect some degree of small bowel  dysmotility or possibly partial obstruction; fluid within the distal ileum, cecum and ascending colon excludes high-grade obstruction. No definite transition point seen. 2. Trace free fluid noted surrounding small bowel loops, and tracking to the pelvis. This may reflect the small bowel process. 3. Scattered calcification along the abdominal aorta and its branches. 4. Small hiatal hernia seen. 5. Tiny periumbilical hernia to the left of midline, containing only fat. 6. Mild diffuse soft tissue edema noted along the proximal anterior left thigh, of uncertain significance.   Electronically Signed   By: Garald Balding M.D.   On: 09/05/2013 03:39   Dg Abd 2 Views  09/06/2013   CLINICAL DATA:  Abdominal pain with nausea ; bowel obstruction  EXAM: ABDOMEN - 2 VIEW  COMPARISON:  CT abdomen and pelvis September 05, 2013  FINDINGS: Supine and upright images were obtained. There is mild bowel dilatation in the right lower quadrant. No other bowel dilatation is appreciable. There are scattered air-fluid levels. No free air. There is increased opacity in the gallbladder, probably due to recent intravenous contrast administration. There is lumbar dextroscoliosis. There are multiple surgical clips on the left.  IMPRESSION: There is mild bowel dilatation with scattered air-fluid levels. A degree of obstruction is felt to remain. Air is seen in the rectum. There is moderate stool in the colon. No free air. Contrast in the gallbladder is noted.   Electronically Signed   By: Lowella Grip M.D.   On: 09/06/2013 09:06         Subjective: Patient had one small loose bowel movement today. Abdominal pain overall is improving. Denies fevers, chills, chest pain, shortness of breath,vomiting, dysuria.  Objective: Filed Vitals:   09/07/13 1413 09/07/13 2300 09/08/13 0500 09/08/13 1343  BP: 121/64 141/54 135/53 146/72  Pulse: 84 80 80 74  Temp: 98.3 F (36.8 C) 99.1 F (37.3 C) 98.3 F (36.8 C) 98.5 F (36.9 C)    TempSrc: Oral Oral Oral Oral  Resp: 18 18 16 17   Weight:      SpO2: 93% 94% 97% 98%    Intake/Output Summary (Last 24 hours) at 09/08/13 1934 Last data filed at 09/08/13 1007  Gross per 24 hour  Intake    600 ml  Output      0 ml  Net    600 ml   Weight change:  Exam:   General:  Pt is alert, follows commands appropriately, not in acute distress  HEENT: No icterus, No thrush,  [place]/AT  Cardiovascular: RRR, S1/S2, no rubs, no gallops  Respiratory: CTA bilaterally, no wheezing, no crackles, no rhonchi  Abdomen: Soft/+BS, non tender, non distended, no guarding  Extremities: 2+ RLE edema, No lymphangitis, No petechiae, No rashes, no synovitis  Data Reviewed: Basic Metabolic Panel:  Recent Labs Lab 09/05/13 0058 09/05/13 0700 09/07/13 0325 09/07/13 1220 09/08/13 0415  NA 137 140 140  --  138  K 3.7 3.8 2.9*  --  3.8  CL 96 102 102  --  102  CO2 28 27 29   --  29  GLUCOSE 131* 133* 92  --  87  BUN 12 12 8   --  5*  CREATININE 1.09 1.07 0.97  --  0.95  CALCIUM 9.9 8.5 8.4  --  8.6  MG  --   --  1.8 1.8  --    Liver Function Tests:  Recent Labs Lab 09/05/13 0058 09/05/13 0700 09/07/13 1220  AST 29 25 26   ALT 17 15 14   ALKPHOS 93 80 60  BILITOT 0.4 0.4 0.2*  PROT 7.7 6.6 6.0  ALBUMIN 3.5 3.0* 2.7*    Recent Labs Lab 09/05/13 0058  LIPASE 34   No results found for this basename: AMMONIA,  in the last 168 hours CBC:  Recent Labs Lab 09/05/13 0058 09/05/13 0700 09/07/13 1223 09/08/13 0415  WBC 7.4 6.3 3.1* 3.3*  NEUTROABS 5.6  --  1.3*  --   HGB 12.4 11.2* 9.1* 9.1*  HCT 37.4 34.8* 26.9* 27.8*  MCV 84.8 85.5 84.9 85.8  PLT 183 156 84* 77*   Cardiac Enzymes: No results found for this basename: CKTOTAL, CKMB, CKMBINDEX, TROPONINI,  in the last 168 hours BNP: No components found with this basename: POCBNP,  CBG:  Recent Labs Lab 09/05/13 0709 09/06/13 0517 09/07/13 0746 09/08/13 0726  GLUCAP 130* 68* 106* 81    No results found for  this or any previous visit (from the past 240 hour(s)).   Scheduled Meds: . aspirin EC  81 mg Oral Daily  . buPROPion  300 mg Oral q morning - 10a  . docusate sodium  100 mg Oral Daily  . DULoxetine  30 mg Oral QPM  . multivitamin with minerals  1 tablet Oral Daily  . pantoprazole (PROTONIX) IV  40 mg Intravenous Q24H  . polyethylene glycol  17 g Oral Daily  . simvastatin  20 mg Oral QPM   Continuous Infusions:    Orson Eva, DO  [facility] Pager [phone]  If 7PM-7AM, please contact night-coverage [email or web address] Password [email or web address] 09/08/2013, 7:34 PM   LOS: 3 days

## 2013-09-09 ENCOUNTER — Inpatient Hospital Stay (HOSPITAL_COMMUNITY): Payer: Medicare Other

## 2013-09-09 DIAGNOSIS — E785 Hyperlipidemia, unspecified: Secondary | ICD-10-CM | POA: Diagnosis not present

## 2013-09-09 DIAGNOSIS — R141 Gas pain: Secondary | ICD-10-CM | POA: Diagnosis not present

## 2013-09-09 DIAGNOSIS — K219 Gastro-esophageal reflux disease without esophagitis: Secondary | ICD-10-CM | POA: Diagnosis not present

## 2013-09-09 DIAGNOSIS — F329 Major depressive disorder, single episode, unspecified: Secondary | ICD-10-CM | POA: Diagnosis not present

## 2013-09-09 DIAGNOSIS — K56609 Unspecified intestinal obstruction, unspecified as to partial versus complete obstruction: Secondary | ICD-10-CM | POA: Diagnosis not present

## 2013-09-09 LAB — BASIC METABOLIC PANEL
BUN: 4 mg/dL — AB (ref 6–23)
CO2: 34 mEq/L — ABNORMAL HIGH (ref 19–32)
Calcium: 8.7 mg/dL (ref 8.4–10.5)
Chloride: 104 mEq/L (ref 96–112)
Creatinine, Ser: 0.95 mg/dL (ref 0.50–1.10)
GFR calc Af Amer: 71 mL/min — ABNORMAL LOW (ref >90)
GFR, EST NON AFRICAN AMERICAN: 61 mL/min — AB (ref >90)
Glucose, Bld: 94 mg/dL (ref 70–99)
POTASSIUM: 4.7 meq/L (ref 3.7–5.3)
SODIUM: 143 meq/L (ref 137–147)

## 2013-09-09 LAB — GLUCOSE, CAPILLARY: Glucose-Capillary: 84 mg/dL (ref 70–99)

## 2013-09-09 LAB — MAGNESIUM: MAGNESIUM: 2 mg/dL (ref 1.5–2.5)

## 2013-09-09 NOTE — Progress Notes (Signed)
Pt discharged to home. DC instructions given. No concerns voiced. Left unit in wheelchair pushed by this RN. Left in good condition. Vwilliams,rn.

## 2013-09-09 NOTE — Progress Notes (Signed)
Patient ID: [name], female   DOB: 1945-11-30, 68 y.o.   MRN: [id number]  General Surgery - [facility] - Progress Note  Subjective: Patient comfortable, no complaints.  Tolerating diet.  No nausea or emesis.  Small formed BM overnight.  Passing flatus per patient.  Objective: Vital signs in last 24 hours: Temp:  [97.8 F (36.6 C)-98.8 F (37.1 C)] 97.8 F (36.6 C) (05/02 0514) Pulse Rate:  [73-74] 73 (05/02 0514) Resp:  [16-17] 16 (05/02 0514) BP: (136-160)/(71-80) 136/71 mmHg (05/02 0514) SpO2:  [94 %-98 %] 94 % (05/02 0514) Weight:  [163 lb (73.936 kg)-163 lb 9.6 oz (74.208 kg)] 163 lb 9.6 oz (74.208 kg) (05/02 0514) Last BM Date: 09/04/13  Intake/Output from previous day: 05/01 0701 - 05/02 0700 In: 360 [P.O.:360] Out: -   Exam: HEENT - clear, not icteric Neck - soft Abd - soft without distension; BS present; non-tender Ext - no significant edema Neuro - grossly intact, no focal deficits  Lab Results:   Recent Labs  09/07/13 1223 09/08/13 0415  WBC 3.1* 3.3*  HGB 9.1* 9.1*  HCT 26.9* 27.8*  PLT 84* 77*     Recent Labs  09/08/13 0415 09/09/13 0450  NA 138 143  K 3.8 4.7  CL 102 104  CO2 29 34*  GLUCOSE 87 94  BUN 5* 4*  CREATININE 0.95 0.95  CALCIUM 8.6 8.7    Studies/Results: No results found.  Assessment / Plan: 1.  Resolved partial SBO  Regular diet  Will sign off - call if needed  Home per medical service  Earnstine Regal, MD, [facility] Office: [phone]  09/09/2013

## 2013-09-09 NOTE — Discharge Summary (Addendum)
Physician Discharge Summary  [name] [id number] DOB: 09/26/1945 DOA: 09/05/2013  PCP: Shirline Frees, MD  Admit date: 09/05/2013 Discharge date: 09/09/2013  Recommendations for Outpatient Follow-up:  1. Pt will need to follow up with PCP in 2 weeks post discharge 2. Please obtain BMP to evaluate electrolytes and kidney function 3. Please also check CBC to evaluate Hg and Hct levels Discharge Diagnoses:  Principal Problem:   SBO (small bowel obstruction) Active Problems:   Depression   Anxiety   GERD (gastroesophageal reflux disease)   Ovarian ca   Dyslipidemia   Hypokalemia PSBO  - provided supportive care with IV fluids, antiemetics, opioids -Advanced to full liquids 4/30  -advance to soft diet 09/08/13 which pt has tolerated -09/09/13 had small soft BM, passing flatus - appreciate surgery consult and recommendations  - continue protonix 40 mg IV daily  -09/06/2013 abdominal x-ray shows mild bowel dilatation with scattered air-fluid levels, moderate stool in colon  -09/09/13 AXR shows improvement of PSBO--nonobstructive bowel gas pattern  Constipation  -This has been a recurrent problem that the patient's chemotherapy  -Last chemotherapy 08/24/2013  -Patient has agreed to try Dulcolax suppository-->passed mucus+flatus (4/30)  -small loose BM on 09/08/13  -TSH--3.190  Hypokalemia  -Replete  -Magnesium 1.8  Anxiety and depression  - continue cymbalta and Wellbutrin  - added ativan 1 mg every 8 hours PRN  GERD (gastroesophageal reflux disease)  - continue protonix  Metastatic Ovarian ca  - has had recent chemo; CBC unremarkable.  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 No reports of fevers or chills. Her abdominal pain is more of a discomfort and she describes it as intermittent. She denies having diarrhea. No reports of blood in stool. No lightheadedness or dizziness. No chest pain, palpitations or shortness of breath. CT of the abdomen on 08/28/2013 showed mild diffuse distention of the small bowel loops concerning for partial small bowel obstruction. The patient was started on intravenous fluids and made n.p.o. She was treated with IV opioids. She was encouraged to increase activity and walk. The patient gradually improved with conservative therapy. General surgery was consulted. For several days, the patient's bowel function returned. Serial abdominal x-rays revealed improvement and resolution of partial small bowel obstruction. Her electrolytes were optimized. Her diet was advanced which the patient tolerated.    Consultants: General surgery--[facility]  Discharge Exam: Filed Vitals:   09/09/13 0514  BP: 136/71  Pulse: 73  Temp: 97.8 F (36.6 C)  Resp: 16   Filed Vitals:   09/08/13 0500 09/08/13 1343 09/08/13 2125 09/09/13 0514  BP: 135/53 146/72 160/80 136/71  Pulse: 80 74 74 73  Temp: 98.3 F (36.8 C) 98.5 F (36.9 C) 98.8 F (37.1 C) 97.8 F (36.6 C)  TempSrc: Oral Oral Oral Oral  Resp: 16 17 16 16   Height:   5\' 2"  (1.575 m)   Weight:  73.936 kg (163 lb) 74.208 kg (163 lb 9.6 oz)  SpO2: 97% 98% 94% 94%   General: A&O x 3, NAD, pleasant, cooperative Cardiovascular: RRR, no rub, no gallop, no S3 Respiratory: CTAB, no wheeze, no rhonchi Abdomen:soft, nontender, nondistended, positive bowel  sounds Extremities: 2+RLE edema, No lymphangitis, no petechiae  Discharge Instructions      Discharge Orders   Future Orders Complete By Expires   Increase activity slowly  As directed        Medication List         aspirin EC 81 MG tablet  Take 81 mg by mouth daily.     buPROPion 300 MG 24 hr tablet  Commonly known as:  WELLBUTRIN XL  Take 300 mg by mouth every morning.     DULoxetine 30 MG capsule  Commonly known as:  CYMBALTA  Take 30 mg by mouth every evening.     Ginger Root 550 MG Caps  Take 1 capsule by mouth daily.     LORazepam 1 MG tablet  Commonly known as:  ATIVAN  Take 1 mg by mouth 2 (two) times daily as needed for anxiety.     multivitamin with minerals Tabs tablet  Take 1 tablet by mouth daily.     pantoprazole 40 MG tablet  Commonly known as:  PROTONIX  Take 40 mg by mouth 2 (two) times daily.     simvastatin 20 MG tablet  Commonly known as:  ZOCOR  Take 20 mg by mouth every evening.     zolpidem 5 MG tablet  Commonly known as:  AMBIEN  Take 5 mg by mouth at bedtime as needed for sleep.         The results of significant diagnostics from this hospitalization (including imaging, microbiology, ancillary and laboratory) are listed below for reference.    Significant Diagnostic Studies: Ct Abdomen Pelvis W Contrast  09/05/2013   CLINICAL DATA:  Epigastric abdominal pain, nausea and vomiting.  EXAM: CT ABDOMEN AND PELVIS WITH CONTRAST  TECHNIQUE: Multidetector CT imaging of the abdomen and pelvis was performed using the standard protocol following bolus administration of intravenous contrast.  CONTRAST:  149mL OMNIPAQUE IOHEXOL 300 MG/ML  SOLN  COMPARISON:  CT of the abdomen and pelvis from 06/08/2008  FINDINGS: The visualized lung bases are clear. A small hiatal hernia is noted.  The liver and spleen are unremarkable in appearance. The gallbladder is within normal limits. The pancreas and adrenal glands are unremarkable.  The kidneys are  unremarkable in appearance. There is no evidence of hydronephrosis. No renal or ureteral stones are seen. No perinephric stranding is appreciated.  There is mild diffuse distention of small bowel loops throughout the abdomen and pelvis, measuring up to 3.3 cm in diameter. This extends to the level of the patient's ileoileal anastomosis at the right lower quadrant, which demonstrates slight associated fecalization. This likely reflects some degree of small bowel dysmotility, or possibly partial obstruction; fluid is seen within the distal ileum, cecum and ascending colon. There is no evidence of high-grade obstruction. No definite transition point is seen.  Trace free fluid is noted surrounding small bowel loops and tracking to the pelvis.  The stomach is within normal limits. No acute vascular abnormalities are seen. Scattered calcification is seen along the abdominal aorta and its branches. A tiny periumbilical hernia is noted to the left of midline, containing only fat.  The patient is status post appendectomy. Fluid is seen filling the cecum and ascending colon. The remainder  of the colon is decompressed. Clips are noted at the left pelvic sidewall.  The bladder is decompressed and not well assessed. The patient is status post hysterectomy. No suspicious adnexal masses are seen. No inguinal lymphadenopathy is seen.  Mild diffuse soft tissue edema is noted along the proximal anterior left thigh.  No acute osseous abnormalities are identified. There is mild chronic loss of height at L4.  IMPRESSION: 1. Mild diffuse distention of small bowel loops throughout the abdomen and pelvis, measuring up to 3.3 cm in diameter. This extends to the level of the ileoileal anastomosis at the right lower quadrant, which demonstrates slight associated fecalization. Findings likely reflect some degree of small bowel dysmotility or possibly partial obstruction; fluid within the distal ileum, cecum and ascending colon excludes  high-grade obstruction. No definite transition point seen. 2. Trace free fluid noted surrounding small bowel loops, and tracking to the pelvis. This may reflect the small bowel process. 3. Scattered calcification along the abdominal aorta and its branches. 4. Small hiatal hernia seen. 5. Tiny periumbilical hernia to the left of midline, containing only fat. 6. Mild diffuse soft tissue edema noted along the proximal anterior left thigh, of uncertain significance.   Electronically Signed   By: Garald Balding M.D.   On: 09/05/2013 03:39   Dg Abd 2 Views  09/09/2013   CLINICAL DATA:  68 year old female with abdominal distention.  EXAM: ABDOMEN - 2 VIEW  COMPARISON:  09/06/2013 radiographs  FINDINGS: A few nondistended gas-filled loops of small bowel are identified with gas in nondistended colon as well.  There is no evidence of dilated bowel loops or pneumoperitoneum.  Surgical clips within a left abdomen and pelvis are noted.  No suspicious calcifications are present.  A lumbar scoliosis and degenerative changes are again noted.  IMPRESSION: Nonspecific nonobstructive bowel gas pattern. No evidence of pneumoperitoneum.   Electronically Signed   By: Hassan Rowan M.D.   On: 09/09/2013 11:07   Dg Abd 2 Views  09/06/2013   CLINICAL DATA:  Abdominal pain with nausea ; bowel obstruction  EXAM: ABDOMEN - 2 VIEW  COMPARISON:  CT abdomen and pelvis September 05, 2013  FINDINGS: Supine and upright images were obtained. There is mild bowel dilatation in the right lower quadrant. No other bowel dilatation is appreciable. There are scattered air-fluid levels. No free air. There is increased opacity in the gallbladder, probably due to recent intravenous contrast administration. There is lumbar dextroscoliosis. There are multiple surgical clips on the left.  IMPRESSION: There is mild bowel dilatation with scattered air-fluid levels. A degree of obstruction is felt to remain. Air is seen in the rectum. There is moderate stool in the  colon. No free air. Contrast in the gallbladder is noted.   Electronically Signed   By: Lowella Grip M.D.   On: 09/06/2013 09:06     Microbiology: No results found for this or any previous visit (from the past 240 hour(s)).   Labs: Basic Metabolic Panel:  Recent Labs Lab 09/05/13 0058 09/05/13 0700 09/07/13 0325 09/07/13 1220 09/08/13 0415 09/09/13 0450  NA 137 140 140  --  138 143  K 3.7 3.8 2.9*  --  3.8 4.7  CL 96 102 102  --  102 104  CO2 28 27 29   --  29 34*  GLUCOSE 131* 133* 92  --  87 94  BUN 12 12 8   --  5* 4*  CREATININE 1.09 1.07 0.97  --  0.95 0.95  CALCIUM 9.9  8.5 8.4  --  8.6 8.7  MG  --   --  1.8 1.8  --  2.0   Liver Function Tests:  Recent Labs Lab 09/05/13 0058 09/05/13 0700 09/07/13 1220  AST 29 25 26   ALT 17 15 14   ALKPHOS 93 80 60  BILITOT 0.4 0.4 0.2*  PROT 7.7 6.6 6.0  ALBUMIN 3.5 3.0* 2.7*    Recent Labs Lab 09/05/13 0058  LIPASE 34   No results found for this basename: AMMONIA,  in the last 168 hours CBC:  Recent Labs Lab 09/05/13 0058 09/05/13 0700 09/07/13 1223 09/08/13 0415  WBC 7.4 6.3 3.1* 3.3*  NEUTROABS 5.6  --  1.3*  --   HGB 12.4 11.2* 9.1* 9.1*  HCT 37.4 34.8* 26.9* 27.8*  MCV 84.8 85.5 84.9 85.8  PLT 183 156 84* 77*   Cardiac Enzymes: No results found for this basename: CKTOTAL, CKMB, CKMBINDEX, TROPONINI,  in the last 168 hours BNP: No components found with this basename: POCBNP,  CBG:  Recent Labs Lab 09/05/13 0709 09/06/13 0517 09/07/13 0746 09/08/13 0726 09/09/13 0800  GLUCAP 130* 68* 106* 81 84    Time coordinating discharge:  Greater than 30 minutes  Signed:  Orson Eva, DO [facility] Pager: [phone] 09/09/2013, 1:23 PM

## 2013-09-12 ENCOUNTER — Ambulatory Visit (HOSPITAL_COMMUNITY): Admission: RE | Admit: 2013-09-12 | Payer: Medicare Other | Source: Ambulatory Visit | Admitting: Gastroenterology

## 2013-09-12 SURGERY — COLONOSCOPY WITH PROPOFOL
Anesthesia: Monitor Anesthesia Care

## 2013-09-14 DIAGNOSIS — Z880 Allergy status to penicillin: Secondary | ICD-10-CM | POA: Diagnosis not present

## 2013-09-14 DIAGNOSIS — Z9079 Acquired absence of other genital organ(s): Secondary | ICD-10-CM | POA: Diagnosis not present

## 2013-09-14 DIAGNOSIS — Z885 Allergy status to narcotic agent status: Secondary | ICD-10-CM | POA: Diagnosis not present

## 2013-09-14 DIAGNOSIS — C569 Malignant neoplasm of unspecified ovary: Secondary | ICD-10-CM | POA: Diagnosis not present

## 2013-09-14 DIAGNOSIS — Z882 Allergy status to sulfonamides status: Secondary | ICD-10-CM | POA: Diagnosis not present

## 2013-09-14 DIAGNOSIS — Z79899 Other long term (current) drug therapy: Secondary | ICD-10-CM | POA: Diagnosis not present

## 2013-09-14 DIAGNOSIS — Z9221 Personal history of antineoplastic chemotherapy: Secondary | ICD-10-CM | POA: Diagnosis not present

## 2013-09-14 DIAGNOSIS — Z5111 Encounter for antineoplastic chemotherapy: Secondary | ICD-10-CM | POA: Diagnosis not present

## 2013-09-14 DIAGNOSIS — Z90710 Acquired absence of both cervix and uterus: Secondary | ICD-10-CM | POA: Diagnosis not present

## 2013-09-14 DIAGNOSIS — R971 Elevated cancer antigen 125 [CA 125]: Secondary | ICD-10-CM | POA: Diagnosis not present

## 2013-09-14 DIAGNOSIS — E785 Hyperlipidemia, unspecified: Secondary | ICD-10-CM | POA: Diagnosis not present

## 2013-09-14 DIAGNOSIS — D6959 Other secondary thrombocytopenia: Secondary | ICD-10-CM | POA: Diagnosis not present

## 2013-09-14 DIAGNOSIS — I890 Lymphedema, not elsewhere classified: Secondary | ICD-10-CM | POA: Diagnosis not present

## 2013-09-14 DIAGNOSIS — F411 Generalized anxiety disorder: Secondary | ICD-10-CM | POA: Diagnosis not present

## 2013-09-14 DIAGNOSIS — K5900 Constipation, unspecified: Secondary | ICD-10-CM | POA: Diagnosis not present

## 2013-09-14 DIAGNOSIS — C778 Secondary and unspecified malignant neoplasm of lymph nodes of multiple regions: Secondary | ICD-10-CM | POA: Diagnosis not present

## 2013-09-14 DIAGNOSIS — Z9049 Acquired absence of other specified parts of digestive tract: Secondary | ICD-10-CM | POA: Diagnosis not present

## 2013-09-14 DIAGNOSIS — Z888 Allergy status to other drugs, medicaments and biological substances status: Secondary | ICD-10-CM | POA: Diagnosis not present

## 2013-09-14 DIAGNOSIS — I059 Rheumatic mitral valve disease, unspecified: Secondary | ICD-10-CM | POA: Diagnosis not present

## 2013-09-28 DIAGNOSIS — Z9221 Personal history of antineoplastic chemotherapy: Secondary | ICD-10-CM | POA: Diagnosis not present

## 2013-09-28 DIAGNOSIS — C569 Malignant neoplasm of unspecified ovary: Secondary | ICD-10-CM | POA: Diagnosis not present

## 2013-09-28 DIAGNOSIS — R971 Elevated cancer antigen 125 [CA 125]: Secondary | ICD-10-CM | POA: Diagnosis not present

## 2013-09-28 DIAGNOSIS — C778 Secondary and unspecified malignant neoplasm of lymph nodes of multiple regions: Secondary | ICD-10-CM | POA: Diagnosis not present

## 2013-09-28 DIAGNOSIS — Z9049 Acquired absence of other specified parts of digestive tract: Secondary | ICD-10-CM | POA: Diagnosis not present

## 2013-09-28 DIAGNOSIS — Z5111 Encounter for antineoplastic chemotherapy: Secondary | ICD-10-CM | POA: Diagnosis not present

## 2013-09-29 DIAGNOSIS — C569 Malignant neoplasm of unspecified ovary: Secondary | ICD-10-CM | POA: Diagnosis not present

## 2013-09-29 DIAGNOSIS — Z9049 Acquired absence of other specified parts of digestive tract: Secondary | ICD-10-CM | POA: Diagnosis not present

## 2013-09-29 DIAGNOSIS — C778 Secondary and unspecified malignant neoplasm of lymph nodes of multiple regions: Secondary | ICD-10-CM | POA: Diagnosis not present

## 2013-09-29 DIAGNOSIS — Z5111 Encounter for antineoplastic chemotherapy: Secondary | ICD-10-CM | POA: Diagnosis not present

## 2013-09-29 DIAGNOSIS — R971 Elevated cancer antigen 125 [CA 125]: Secondary | ICD-10-CM | POA: Diagnosis not present

## 2013-09-29 DIAGNOSIS — Z9221 Personal history of antineoplastic chemotherapy: Secondary | ICD-10-CM | POA: Diagnosis not present

## 2013-10-03 DIAGNOSIS — R971 Elevated cancer antigen 125 [CA 125]: Secondary | ICD-10-CM | POA: Diagnosis not present

## 2013-10-03 DIAGNOSIS — C569 Malignant neoplasm of unspecified ovary: Secondary | ICD-10-CM | POA: Diagnosis not present

## 2013-10-03 DIAGNOSIS — C778 Secondary and unspecified malignant neoplasm of lymph nodes of multiple regions: Secondary | ICD-10-CM | POA: Diagnosis not present

## 2013-10-03 DIAGNOSIS — Z9221 Personal history of antineoplastic chemotherapy: Secondary | ICD-10-CM | POA: Diagnosis not present

## 2013-10-03 DIAGNOSIS — Z9049 Acquired absence of other specified parts of digestive tract: Secondary | ICD-10-CM | POA: Diagnosis not present

## 2013-10-03 DIAGNOSIS — Z5111 Encounter for antineoplastic chemotherapy: Secondary | ICD-10-CM | POA: Diagnosis not present

## 2013-10-04 DIAGNOSIS — C569 Malignant neoplasm of unspecified ovary: Secondary | ICD-10-CM | POA: Diagnosis not present

## 2013-10-04 DIAGNOSIS — Z9049 Acquired absence of other specified parts of digestive tract: Secondary | ICD-10-CM | POA: Diagnosis not present

## 2013-10-04 DIAGNOSIS — Z5111 Encounter for antineoplastic chemotherapy: Secondary | ICD-10-CM | POA: Diagnosis not present

## 2013-10-04 DIAGNOSIS — C778 Secondary and unspecified malignant neoplasm of lymph nodes of multiple regions: Secondary | ICD-10-CM | POA: Diagnosis not present

## 2013-10-04 DIAGNOSIS — Z9221 Personal history of antineoplastic chemotherapy: Secondary | ICD-10-CM | POA: Diagnosis not present

## 2013-10-04 DIAGNOSIS — R971 Elevated cancer antigen 125 [CA 125]: Secondary | ICD-10-CM | POA: Diagnosis not present

## 2013-10-05 DIAGNOSIS — Z9049 Acquired absence of other specified parts of digestive tract: Secondary | ICD-10-CM | POA: Diagnosis not present

## 2013-10-05 DIAGNOSIS — Z5111 Encounter for antineoplastic chemotherapy: Secondary | ICD-10-CM | POA: Diagnosis not present

## 2013-10-05 DIAGNOSIS — R971 Elevated cancer antigen 125 [CA 125]: Secondary | ICD-10-CM | POA: Diagnosis not present

## 2013-10-05 DIAGNOSIS — C569 Malignant neoplasm of unspecified ovary: Secondary | ICD-10-CM | POA: Diagnosis not present

## 2013-10-05 DIAGNOSIS — C778 Secondary and unspecified malignant neoplasm of lymph nodes of multiple regions: Secondary | ICD-10-CM | POA: Diagnosis not present

## 2013-10-05 DIAGNOSIS — Z9221 Personal history of antineoplastic chemotherapy: Secondary | ICD-10-CM | POA: Diagnosis not present

## 2013-10-12 DIAGNOSIS — C569 Malignant neoplasm of unspecified ovary: Secondary | ICD-10-CM | POA: Diagnosis not present

## 2013-10-12 DIAGNOSIS — Z5111 Encounter for antineoplastic chemotherapy: Secondary | ICD-10-CM | POA: Diagnosis not present

## 2013-10-13 DIAGNOSIS — C569 Malignant neoplasm of unspecified ovary: Secondary | ICD-10-CM | POA: Diagnosis not present

## 2013-10-13 DIAGNOSIS — Z5111 Encounter for antineoplastic chemotherapy: Secondary | ICD-10-CM | POA: Diagnosis not present

## 2013-10-26 DIAGNOSIS — D649 Anemia, unspecified: Secondary | ICD-10-CM | POA: Diagnosis not present

## 2013-10-26 DIAGNOSIS — Z791 Long term (current) use of non-steroidal anti-inflammatories (NSAID): Secondary | ICD-10-CM | POA: Diagnosis not present

## 2013-10-26 DIAGNOSIS — Z79899 Other long term (current) drug therapy: Secondary | ICD-10-CM | POA: Diagnosis not present

## 2013-10-26 DIAGNOSIS — Z9221 Personal history of antineoplastic chemotherapy: Secondary | ICD-10-CM | POA: Diagnosis not present

## 2013-10-26 DIAGNOSIS — D702 Other drug-induced agranulocytosis: Secondary | ICD-10-CM | POA: Diagnosis not present

## 2013-10-26 DIAGNOSIS — Z7982 Long term (current) use of aspirin: Secondary | ICD-10-CM | POA: Diagnosis not present

## 2013-10-26 DIAGNOSIS — I890 Lymphedema, not elsewhere classified: Secondary | ICD-10-CM | POA: Diagnosis not present

## 2013-10-26 DIAGNOSIS — C569 Malignant neoplasm of unspecified ovary: Secondary | ICD-10-CM | POA: Diagnosis not present

## 2013-10-26 DIAGNOSIS — Z87891 Personal history of nicotine dependence: Secondary | ICD-10-CM | POA: Diagnosis not present

## 2013-10-26 DIAGNOSIS — D6481 Anemia due to antineoplastic chemotherapy: Secondary | ICD-10-CM | POA: Diagnosis not present

## 2013-10-26 DIAGNOSIS — M79609 Pain in unspecified limb: Secondary | ICD-10-CM | POA: Diagnosis not present

## 2013-10-27 DIAGNOSIS — C569 Malignant neoplasm of unspecified ovary: Secondary | ICD-10-CM | POA: Diagnosis not present

## 2013-11-02 DIAGNOSIS — Z880 Allergy status to penicillin: Secondary | ICD-10-CM | POA: Diagnosis not present

## 2013-11-02 DIAGNOSIS — Z87891 Personal history of nicotine dependence: Secondary | ICD-10-CM | POA: Diagnosis not present

## 2013-11-02 DIAGNOSIS — Z90711 Acquired absence of uterus with remaining cervical stump: Secondary | ICD-10-CM | POA: Diagnosis not present

## 2013-11-02 DIAGNOSIS — E785 Hyperlipidemia, unspecified: Secondary | ICD-10-CM | POA: Diagnosis not present

## 2013-11-02 DIAGNOSIS — Z7982 Long term (current) use of aspirin: Secondary | ICD-10-CM | POA: Diagnosis not present

## 2013-11-02 DIAGNOSIS — C569 Malignant neoplasm of unspecified ovary: Secondary | ICD-10-CM | POA: Diagnosis not present

## 2013-11-02 DIAGNOSIS — I890 Lymphedema, not elsewhere classified: Secondary | ICD-10-CM | POA: Diagnosis not present

## 2013-11-02 DIAGNOSIS — Z9079 Acquired absence of other genital organ(s): Secondary | ICD-10-CM | POA: Diagnosis not present

## 2013-11-02 DIAGNOSIS — N809 Endometriosis, unspecified: Secondary | ICD-10-CM | POA: Diagnosis not present

## 2013-11-02 DIAGNOSIS — Z882 Allergy status to sulfonamides status: Secondary | ICD-10-CM | POA: Diagnosis not present

## 2013-11-02 DIAGNOSIS — Z5111 Encounter for antineoplastic chemotherapy: Secondary | ICD-10-CM | POA: Diagnosis not present

## 2013-11-02 DIAGNOSIS — I059 Rheumatic mitral valve disease, unspecified: Secondary | ICD-10-CM | POA: Diagnosis not present

## 2013-11-02 DIAGNOSIS — Z881 Allergy status to other antibiotic agents status: Secondary | ICD-10-CM | POA: Diagnosis not present

## 2013-11-02 DIAGNOSIS — Z79899 Other long term (current) drug therapy: Secondary | ICD-10-CM | POA: Diagnosis not present

## 2013-11-02 DIAGNOSIS — F411 Generalized anxiety disorder: Secondary | ICD-10-CM | POA: Diagnosis not present

## 2013-11-02 DIAGNOSIS — D6481 Anemia due to antineoplastic chemotherapy: Secondary | ICD-10-CM | POA: Diagnosis not present

## 2013-11-03 DIAGNOSIS — Z5111 Encounter for antineoplastic chemotherapy: Secondary | ICD-10-CM | POA: Diagnosis not present

## 2013-11-03 DIAGNOSIS — I890 Lymphedema, not elsewhere classified: Secondary | ICD-10-CM | POA: Diagnosis not present

## 2013-11-03 DIAGNOSIS — D6481 Anemia due to antineoplastic chemotherapy: Secondary | ICD-10-CM | POA: Diagnosis not present

## 2013-11-03 DIAGNOSIS — E785 Hyperlipidemia, unspecified: Secondary | ICD-10-CM | POA: Diagnosis not present

## 2013-11-03 DIAGNOSIS — F411 Generalized anxiety disorder: Secondary | ICD-10-CM | POA: Diagnosis not present

## 2013-11-03 DIAGNOSIS — C569 Malignant neoplasm of unspecified ovary: Secondary | ICD-10-CM | POA: Diagnosis not present

## 2013-11-13 DIAGNOSIS — M5137 Other intervertebral disc degeneration, lumbosacral region: Secondary | ICD-10-CM | POA: Diagnosis not present

## 2013-11-13 DIAGNOSIS — M62838 Other muscle spasm: Secondary | ICD-10-CM | POA: Diagnosis not present

## 2013-11-13 DIAGNOSIS — M999 Biomechanical lesion, unspecified: Secondary | ICD-10-CM | POA: Diagnosis not present

## 2013-11-14 DIAGNOSIS — M5137 Other intervertebral disc degeneration, lumbosacral region: Secondary | ICD-10-CM | POA: Diagnosis not present

## 2013-11-14 DIAGNOSIS — M62838 Other muscle spasm: Secondary | ICD-10-CM | POA: Diagnosis not present

## 2013-11-14 DIAGNOSIS — M999 Biomechanical lesion, unspecified: Secondary | ICD-10-CM | POA: Diagnosis not present

## 2013-11-16 DIAGNOSIS — M62838 Other muscle spasm: Secondary | ICD-10-CM | POA: Diagnosis not present

## 2013-11-16 DIAGNOSIS — Z79899 Other long term (current) drug therapy: Secondary | ICD-10-CM | POA: Diagnosis not present

## 2013-11-16 DIAGNOSIS — Z882 Allergy status to sulfonamides status: Secondary | ICD-10-CM | POA: Diagnosis not present

## 2013-11-16 DIAGNOSIS — M5137 Other intervertebral disc degeneration, lumbosacral region: Secondary | ICD-10-CM | POA: Diagnosis not present

## 2013-11-16 DIAGNOSIS — C801 Malignant (primary) neoplasm, unspecified: Secondary | ICD-10-CM | POA: Diagnosis not present

## 2013-11-16 DIAGNOSIS — Z90710 Acquired absence of both cervix and uterus: Secondary | ICD-10-CM | POA: Diagnosis not present

## 2013-11-16 DIAGNOSIS — I059 Rheumatic mitral valve disease, unspecified: Secondary | ICD-10-CM | POA: Diagnosis not present

## 2013-11-16 DIAGNOSIS — E785 Hyperlipidemia, unspecified: Secondary | ICD-10-CM | POA: Diagnosis not present

## 2013-11-16 DIAGNOSIS — Z791 Long term (current) use of non-steroidal anti-inflammatories (NSAID): Secondary | ICD-10-CM | POA: Diagnosis not present

## 2013-11-16 DIAGNOSIS — M999 Biomechanical lesion, unspecified: Secondary | ICD-10-CM | POA: Diagnosis not present

## 2013-11-16 DIAGNOSIS — C569 Malignant neoplasm of unspecified ovary: Secondary | ICD-10-CM | POA: Diagnosis not present

## 2013-11-16 DIAGNOSIS — K5900 Constipation, unspecified: Secondary | ICD-10-CM | POA: Diagnosis not present

## 2013-11-16 DIAGNOSIS — D6959 Other secondary thrombocytopenia: Secondary | ICD-10-CM | POA: Diagnosis not present

## 2013-11-16 DIAGNOSIS — Z9079 Acquired absence of other genital organ(s): Secondary | ICD-10-CM | POA: Diagnosis not present

## 2013-11-16 DIAGNOSIS — Z888 Allergy status to other drugs, medicaments and biological substances status: Secondary | ICD-10-CM | POA: Diagnosis not present

## 2013-11-16 DIAGNOSIS — I890 Lymphedema, not elsewhere classified: Secondary | ICD-10-CM | POA: Diagnosis not present

## 2013-11-16 DIAGNOSIS — F411 Generalized anxiety disorder: Secondary | ICD-10-CM | POA: Diagnosis not present

## 2013-11-16 DIAGNOSIS — Z880 Allergy status to penicillin: Secondary | ICD-10-CM | POA: Diagnosis not present

## 2013-11-20 DIAGNOSIS — D6959 Other secondary thrombocytopenia: Secondary | ICD-10-CM | POA: Diagnosis not present

## 2013-11-20 DIAGNOSIS — I890 Lymphedema, not elsewhere classified: Secondary | ICD-10-CM | POA: Diagnosis not present

## 2013-11-20 DIAGNOSIS — C801 Malignant (primary) neoplasm, unspecified: Secondary | ICD-10-CM | POA: Diagnosis not present

## 2013-11-20 DIAGNOSIS — M62838 Other muscle spasm: Secondary | ICD-10-CM | POA: Diagnosis not present

## 2013-11-20 DIAGNOSIS — E785 Hyperlipidemia, unspecified: Secondary | ICD-10-CM | POA: Diagnosis not present

## 2013-11-20 DIAGNOSIS — M5137 Other intervertebral disc degeneration, lumbosacral region: Secondary | ICD-10-CM | POA: Diagnosis not present

## 2013-11-20 DIAGNOSIS — M999 Biomechanical lesion, unspecified: Secondary | ICD-10-CM | POA: Diagnosis not present

## 2013-11-20 DIAGNOSIS — K5900 Constipation, unspecified: Secondary | ICD-10-CM | POA: Diagnosis not present

## 2013-11-20 DIAGNOSIS — C569 Malignant neoplasm of unspecified ovary: Secondary | ICD-10-CM | POA: Diagnosis not present

## 2013-11-21 DIAGNOSIS — M5137 Other intervertebral disc degeneration, lumbosacral region: Secondary | ICD-10-CM | POA: Diagnosis not present

## 2013-11-21 DIAGNOSIS — M545 Low back pain, unspecified: Secondary | ICD-10-CM | POA: Diagnosis not present

## 2013-11-21 DIAGNOSIS — M4120 Other idiopathic scoliosis, site unspecified: Secondary | ICD-10-CM | POA: Diagnosis not present

## 2013-11-23 DIAGNOSIS — Z5111 Encounter for antineoplastic chemotherapy: Secondary | ICD-10-CM | POA: Diagnosis not present

## 2013-11-23 DIAGNOSIS — C569 Malignant neoplasm of unspecified ovary: Secondary | ICD-10-CM | POA: Diagnosis not present

## 2013-11-29 DIAGNOSIS — M4120 Other idiopathic scoliosis, site unspecified: Secondary | ICD-10-CM | POA: Diagnosis not present

## 2013-12-06 DIAGNOSIS — M5137 Other intervertebral disc degeneration, lumbosacral region: Secondary | ICD-10-CM | POA: Diagnosis not present

## 2013-12-06 DIAGNOSIS — IMO0002 Reserved for concepts with insufficient information to code with codable children: Secondary | ICD-10-CM | POA: Diagnosis not present

## 2013-12-08 ENCOUNTER — Ambulatory Visit (HOSPITAL_COMMUNITY)
Admission: RE | Admit: 2013-12-08 | Discharge: 2013-12-08 | Disposition: A | Discharge status: Home / Self Care | Payer: Medicare Other | Source: Ambulatory Visit | Attending: Interventional Radiology | Admitting: Interventional Radiology

## 2013-12-08 ENCOUNTER — Other Ambulatory Visit (HOSPITAL_COMMUNITY): Payer: Self-pay | Admitting: Interventional Radiology

## 2013-12-08 DIAGNOSIS — I890 Lymphedema, not elsewhere classified: Secondary | ICD-10-CM | POA: Diagnosis not present

## 2013-12-08 DIAGNOSIS — K5900 Constipation, unspecified: Secondary | ICD-10-CM | POA: Diagnosis not present

## 2013-12-08 DIAGNOSIS — E785 Hyperlipidemia, unspecified: Secondary | ICD-10-CM | POA: Diagnosis not present

## 2013-12-08 DIAGNOSIS — D6959 Other secondary thrombocytopenia: Secondary | ICD-10-CM | POA: Diagnosis not present

## 2013-12-08 DIAGNOSIS — M545 Low back pain: Secondary | ICD-10-CM

## 2013-12-08 DIAGNOSIS — S32049A Unspecified fracture of fourth lumbar vertebra, initial encounter for closed fracture: Secondary | ICD-10-CM

## 2013-12-08 DIAGNOSIS — C801 Malignant (primary) neoplasm, unspecified: Secondary | ICD-10-CM | POA: Diagnosis not present

## 2013-12-08 DIAGNOSIS — C569 Malignant neoplasm of unspecified ovary: Secondary | ICD-10-CM | POA: Diagnosis not present

## 2013-12-08 DIAGNOSIS — M8448XA Pathological fracture, other site, initial encounter for fracture: Secondary | ICD-10-CM | POA: Diagnosis not present

## 2013-12-11 ENCOUNTER — Other Ambulatory Visit: Payer: Self-pay | Admitting: Radiology

## 2013-12-11 ENCOUNTER — Other Ambulatory Visit: Payer: Self-pay

## 2013-12-11 ENCOUNTER — Other Ambulatory Visit (HOSPITAL_COMMUNITY): Payer: Self-pay | Admitting: Interventional Radiology

## 2013-12-11 DIAGNOSIS — Z1231 Encounter for screening mammogram for malignant neoplasm of breast: Secondary | ICD-10-CM

## 2013-12-11 DIAGNOSIS — M549 Dorsalgia, unspecified: Secondary | ICD-10-CM

## 2013-12-11 DIAGNOSIS — IMO0002 Reserved for concepts with insufficient information to code with codable children: Secondary | ICD-10-CM

## 2013-12-12 ENCOUNTER — Other Ambulatory Visit (HOSPITAL_COMMUNITY): Payer: Self-pay | Admitting: Interventional Radiology

## 2013-12-12 ENCOUNTER — Encounter (HOSPITAL_COMMUNITY): Payer: Self-pay

## 2013-12-12 ENCOUNTER — Ambulatory Visit (HOSPITAL_COMMUNITY)
Admission: RE | Admit: 2013-12-12 | Discharge: 2013-12-12 | Disposition: A | Discharge status: Home / Self Care | Payer: Medicare Other | Source: Ambulatory Visit | Attending: Interventional Radiology | Admitting: Interventional Radiology

## 2013-12-12 DIAGNOSIS — M549 Dorsalgia, unspecified: Secondary | ICD-10-CM

## 2013-12-12 DIAGNOSIS — M8448XA Pathological fracture, other site, initial encounter for fracture: Secondary | ICD-10-CM | POA: Diagnosis not present

## 2013-12-12 DIAGNOSIS — S32050A Wedge compression fracture of fifth lumbar vertebra, initial encounter for closed fracture: Secondary | ICD-10-CM

## 2013-12-12 DIAGNOSIS — F3289 Other specified depressive episodes: Secondary | ICD-10-CM | POA: Diagnosis not present

## 2013-12-12 DIAGNOSIS — K219 Gastro-esophageal reflux disease without esophagitis: Secondary | ICD-10-CM | POA: Insufficient documentation

## 2013-12-12 DIAGNOSIS — M545 Low back pain, unspecified: Secondary | ICD-10-CM

## 2013-12-12 DIAGNOSIS — IMO0002 Reserved for concepts with insufficient information to code with codable children: Secondary | ICD-10-CM

## 2013-12-12 DIAGNOSIS — F411 Generalized anxiety disorder: Secondary | ICD-10-CM | POA: Insufficient documentation

## 2013-12-12 DIAGNOSIS — D492 Neoplasm of unspecified behavior of bone, soft tissue, and skin: Secondary | ICD-10-CM | POA: Insufficient documentation

## 2013-12-12 DIAGNOSIS — I059 Rheumatic mitral valve disease, unspecified: Secondary | ICD-10-CM | POA: Insufficient documentation

## 2013-12-12 DIAGNOSIS — Z87891 Personal history of nicotine dependence: Secondary | ICD-10-CM | POA: Diagnosis not present

## 2013-12-12 DIAGNOSIS — D166 Benign neoplasm of vertebral column: Secondary | ICD-10-CM | POA: Diagnosis not present

## 2013-12-12 DIAGNOSIS — Z8543 Personal history of malignant neoplasm of ovary: Secondary | ICD-10-CM | POA: Diagnosis not present

## 2013-12-12 DIAGNOSIS — Z9221 Personal history of antineoplastic chemotherapy: Secondary | ICD-10-CM | POA: Insufficient documentation

## 2013-12-12 DIAGNOSIS — F329 Major depressive disorder, single episode, unspecified: Secondary | ICD-10-CM | POA: Diagnosis not present

## 2013-12-12 DIAGNOSIS — Z7982 Long term (current) use of aspirin: Secondary | ICD-10-CM | POA: Insufficient documentation

## 2013-12-12 LAB — CBC WITH DIFFERENTIAL/PLATELET
BASOS ABS: 0 10*3/uL (ref 0.0–0.1)
BASOS PCT: 0 % (ref 0–1)
Eosinophils Absolute: 0.1 10*3/uL (ref 0.0–0.7)
Eosinophils Relative: 2 % (ref 0–5)
HCT: 27.5 % — ABNORMAL LOW (ref 36.0–46.0)
Hemoglobin: 9.1 g/dL — ABNORMAL LOW (ref 12.0–15.0)
LYMPHS PCT: 56 % — AB (ref 12–46)
Lymphs Abs: 1.8 10*3/uL (ref 0.7–4.0)
MCH: 32.4 pg (ref 26.0–34.0)
MCHC: 33.1 g/dL (ref 30.0–36.0)
MCV: 97.9 fL (ref 78.0–100.0)
Monocytes Absolute: 0.3 10*3/uL (ref 0.1–1.0)
Monocytes Relative: 10 % (ref 3–12)
NEUTROS ABS: 1.1 10*3/uL — AB (ref 1.7–7.7)
NEUTROS PCT: 32 % — AB (ref 43–77)
PLATELETS: 174 10*3/uL (ref 150–400)
RBC: 2.81 MIL/uL — ABNORMAL LOW (ref 3.87–5.11)
RDW: 18 % — AB (ref 11.5–15.5)
WBC: 3.2 10*3/uL — ABNORMAL LOW (ref 4.0–10.5)

## 2013-12-12 LAB — BASIC METABOLIC PANEL
Anion gap: 12 (ref 5–15)
BUN: 9 mg/dL (ref 6–23)
CALCIUM: 8.9 mg/dL (ref 8.4–10.5)
CO2: 28 mEq/L (ref 19–32)
CREATININE: 0.87 mg/dL (ref 0.50–1.10)
Chloride: 101 mEq/L (ref 96–112)
GFR calc non Af Amer: 68 mL/min — ABNORMAL LOW (ref >90)
GFR, EST AFRICAN AMERICAN: 79 mL/min — AB (ref >90)
Glucose, Bld: 92 mg/dL (ref 70–99)
Potassium: 3.4 mEq/L — ABNORMAL LOW (ref 3.7–5.3)
SODIUM: 141 meq/L (ref 137–147)

## 2013-12-12 LAB — APTT: APTT: 29 s (ref 24–37)

## 2013-12-12 LAB — PROTIME-INR
INR: 0.94 (ref 0.00–1.49)
PROTHROMBIN TIME: 12.6 s (ref 11.6–15.2)

## 2013-12-12 MED ORDER — MIDAZOLAM HCL 2 MG/2ML IJ SOLN
INTRAMUSCULAR | Status: AC
  Filled 2013-12-12: qty 2

## 2013-12-12 MED ORDER — SODIUM CHLORIDE 0.9 % IV SOLN: INTRAVENOUS | Status: AC

## 2013-12-12 MED ORDER — NALOXONE HCL 0.4 MG/ML IJ SOLN
INTRAMUSCULAR | Status: AC
  Filled 2013-12-12: qty 1

## 2013-12-12 MED ORDER — MIDAZOLAM HCL 5 MG/5ML IJ SOLN
INTRAMUSCULAR | Status: AC | PRN
  Administered 2013-12-12: 1 mg via INTRAVENOUS

## 2013-12-12 MED ORDER — TOBRAMYCIN SULFATE 1.2 G IJ SOLR
INTRAMUSCULAR | Status: AC
  Filled 2013-12-12: qty 1.2

## 2013-12-12 MED ORDER — MIDAZOLAM HCL 2 MG/2ML IJ SOLN
INTRAMUSCULAR | Status: AC
Start: 2013-12-12 — End: 2013-12-12
  Filled 2013-12-12: qty 4

## 2013-12-12 MED ORDER — SODIUM CHLORIDE 0.9 % IV SOLN
INTRAVENOUS | Status: AC | PRN
  Administered 2013-12-12: 500 mL/h via INTRAVENOUS

## 2013-12-12 MED ORDER — HYDROMORPHONE HCL PF 1 MG/ML IJ SOLN
INTRAMUSCULAR | Status: AC
  Filled 2013-12-12: qty 2

## 2013-12-12 MED ORDER — HYDROMORPHONE HCL PF 1 MG/ML IJ SOLN
INTRAMUSCULAR | Status: AC
  Filled 2013-12-12: qty 1

## 2013-12-12 MED ORDER — VANCOMYCIN HCL IN DEXTROSE 1-5 GM/200ML-% IV SOLN
1000.0000 mg | INTRAVENOUS | Status: AC
  Administered 2013-12-12: 1000 mg via INTRAVENOUS
  Filled 2013-12-12: qty 200

## 2013-12-12 MED ORDER — FENTANYL CITRATE 0.05 MG/ML IJ SOLN
INTRAMUSCULAR | Status: AC | PRN
  Administered 2013-12-12 (×3): 25 ug via INTRAVENOUS

## 2013-12-12 MED ORDER — ATROPINE SULFATE 0.1 MG/ML IJ SOLN
INTRAMUSCULAR | Status: AC
  Filled 2013-12-12: qty 10

## 2013-12-12 MED ORDER — FENTANYL CITRATE 0.05 MG/ML IJ SOLN
INTRAMUSCULAR | Status: AC
  Filled 2013-12-12: qty 4

## 2013-12-12 MED ORDER — FLUMAZENIL 0.5 MG/5ML IV SOLN
INTRAVENOUS | Status: AC
  Filled 2013-12-12: qty 5

## 2013-12-12 MED ORDER — MIDAZOLAM HCL 2 MG/2ML IJ SOLN
INTRAMUSCULAR | Status: AC | PRN
  Administered 2013-12-12 (×3): 1 mg via INTRAVENOUS

## 2013-12-12 MED ORDER — HYDROMORPHONE HCL PF 1 MG/ML IJ SOLN
INTRAMUSCULAR | Status: AC | PRN
  Administered 2013-12-12 (×4): 1 mg

## 2013-12-12 MED ORDER — SODIUM CHLORIDE 0.9 % IV SOLN
INTRAVENOUS | Status: DC
  Administered 2013-12-12: 08:00:00 via INTRAVENOUS

## 2013-12-12 NOTE — H&P (Signed)
Chief Complaint: "Back pain and right hip pain." HPI: [name] is an 68 y.o. female with severe low back pain and right hip pain with intermittent pain radiating into her right leg. She states she was flipping her mattress approximately 1 month ago. She has been on dilaudid with minimal relief and rates her back/right hip pain 10/10 today. A recent MRI scan of the lumbosacral spine performed at an outside institution was reviewed. This demonstrates compression deformity with signal abnormalities at L5 and to lesser degree at L4. She has history of ovarian carcinoma and biopsy at site with possible ablation was discussed at recent consult. She denies any chest pain, shortness of breath or palpitations. She denies any active signs of bleeding or excessive bruising. She denies any recent fever or chills. The patient denies any history of sleep apnea or chronic oxygen use. She has previously tolerated sedation without complications.   Past Medical History:  Past Medical History  Diagnosis Date  . Mitral valve prolapse 03-10-13    rare palpitations  . Anxiety   . Depression   . Lymph edema 03-10-13    left leg( hip to foot)-consistent-remains an issue 08-21-13  . GERD (gastroesophageal reflux disease)   . Seizures     '70- x1 post Phenergan IV for nausea during pregnancy  . Wrist fracture     11'14-casted only,no surgery-no problems now  . Nodule of neck 08-21-13    Chemotherapy to start this week for this  . Portacath in place 08-21-13    left chest remains  . Cancer     '10-Ovarian cancer(tx. surgery, chemotherapy) -residual lymph edema lt. leg,restart of chemotherapy this week-Dr. Verne Spurr)    Past Surgical History:  Past Surgical History  Procedure Laterality Date  . Abdominal hysterectomy      '79-no cancer  . Laparotomy for staging / restaging      '10- [facility] (Dr.Nycum)-Dr. Pippitt, oncology- surgery Ovarian Cancer  . Appendectomy      '10  with Ovarian staging  surgery  . Cataract extraction, bilateral Bilateral   . Tubal ligation    . Fracture surgery Right     '12 -[facility]IF Rt. shoulder  . Portacath placement      has left chest pac  . Angioplasty  2009  . Foot surgery      tendon many yrs ago    Family History: No family history on file.  Social History:  reports that she quit smoking about 27 years ago. Her smoking use included Cigarettes. She has a 19.5 pack-year smoking history. She has never used smokeless tobacco. She reports that she does not drink alcohol or use illicit drugs.  Allergies:  Allergies  Allergen Reactions  . Clindamycin/Lincomycin Nausea And Vomiting  . Phenergan [Promethazine]     seizure  . Prochlorperazine     Restless Legs with compazine  . Sulfa Antibiotics Nausea And Vomiting  . Penicillins Rash    Medications:   Medication List    ASK your doctor about these medications       aspirin EC 81 MG tablet  Take 81 mg by mouth daily.     buPROPion 300 MG 24 hr tablet  Commonly known as:  WELLBUTRIN XL  Take 300 mg by mouth every morning.     DULoxetine 30 MG capsule  Commonly known as:  CYMBALTA  Take 30 mg by mouth every evening.     Ginger Root 550 MG Caps  Take 1 capsule by mouth daily.  LORazepam 1 MG tablet  Commonly known as:  ATIVAN  Take 1 mg by mouth 2 (two) times daily as needed for anxiety.     multivitamin with minerals Tabs tablet  Take 1 tablet by mouth daily.     pantoprazole 40 MG tablet  Commonly known as:  PROTONIX  Take 40 mg by mouth 2 (two) times daily.     simvastatin 20 MG tablet  Commonly known as:  ZOCOR  Take 20 mg by mouth every evening.     zolpidem 5 MG tablet  Commonly known as:  AMBIEN  Take 5 mg by mouth at bedtime as needed for sleep.       Please HPI for pertinent positives, otherwise complete 10 system ROS negative.  Physical Exam: BP 155/62  Pulse 86  Temp(Src) 98.2 F (36.8 C) (Oral)  Resp 18  Ht 5\' 3"  (1.6 m)  Wt 160 lb (72.576 kg)   BMI 28.35 kg/m2  SpO2 99% Body mass index is 28.35 kg/(m^2).  General Appearance:  Alert, cooperative, no distress  Head:  Normocephalic, without obvious abnormality, atraumatic  Neck: Supple, symmetrical, trachea midline  Lungs:   Clear to auscultation bilaterally, no w/r/r, respirations unlabored without use of accessory muscles.  Back: Lower lumbar/sacral tenderness and right sided hip tenderness to palpation.   Chest Wall:  No tenderness or deformity  Heart:  Regular rate and rhythm, S1, S2 normal, no murmur, rub or gallop.  Abdomen:   Soft, non-tender, non distended.  Extremities: LLE lymphedema, RLE without edema.   Neurologic: Normal affect, no gross deficits.   No results found for this or any previous visit (from the past 48 hour(s)). No results found.  Assessment/Plan Back pain s/p injury 1 month ago Compression fractures L4-5 Ovarian carcinoma Seen in consult for VP/KP possible ablation and scheduled today. Patient has been NPO, no blood thinners taken, afebrile, labs reviewed. Risks and Benefits discussed with the patient. All of the patient's questions were answered, patient is agreeable to proceed. Consent signed and in chart.   Tsosie Billing D PA-C 12/12/2013, 8:29 AM

## 2013-12-12 NOTE — Procedures (Signed)
S/P L4 and L5 vertebral body augmentations preceded by RF ablation.

## 2013-12-12 NOTE — Sedation Documentation (Signed)
On sttretcher, [place] up, Dr Estanislado Pandy explaining procedure to pt.  O2 d/c'd Pt awake and alert

## 2013-12-12 NOTE — Discharge Instructions (Signed)
1. No stooping,bending or lifting more than 10 lbs for 2 weeks. °2.Use walker to ambulate for 2 weeks . °3.RTC in 2 weeks °KYPHOPLASTY/VERTEBROPLASTY DISCHARGE INSTRUCTIONS ° °Medications: (check all that apply) ° °   Resume all home medications as before procedure. °   °           °  °Continue your pain medications as prescribed as needed.  Over the next 3-5 days, decrease your pain medication as tolerated.  Over the counter medications (i.e. Tylenol, ibuprofen, and aleve) may be substituted once severe/moderate pain symptoms have subsided. ° ° Wound Care: °- Bandages may be removed the day following your procedure.  You may get your incision wet once bandages are removed.  Bandaids may be used to cover the incisions until scab formation.  Topical ointments are optional. ° °- If you develop a fever greater than 101 degrees, have increased skin redness at the incision sites or pus-like oozing from incisions occurring within 1 week of the procedure, contact radiology at [phone] or [phone]. ° °- Ice pack to back for 15-20 minutes 2-3 time per day for first 2-3 days post procedure.  The ice will expedite muscle healing and help with the pain from the incisions. ° ° Activity: °- Bedrest today with limited activity for 24 hours post procedure. ° °- No driving for 48 hours. ° °- Increase your activity as tolerated after bedrest (with assistance if necessary). ° °- Refrain from any strenuous activity or heavy lifting (greater than 10 lbs.). ° ° Follow up: °- Contact radiology at [phone] or [phone] if any questions/concerns. ° °- A physician assistant from radiology will contact you in approximately 1 week. ° °- If a biopsy was performed at the time of your procedure, your referring physician should receive the results in usually 2-3 days. ° ° ° ° ° ° ° ° °

## 2013-12-12 NOTE — Sedation Documentation (Signed)
O2 discontinued.  Pt maintains O2 Sat 99% RA.

## 2013-12-12 NOTE — Sedation Documentation (Signed)
Sat down 89% O2 resumed

## 2013-12-13 ENCOUNTER — Other Ambulatory Visit (HOSPITAL_COMMUNITY): Payer: Self-pay | Admitting: Interventional Radiology

## 2013-12-13 DIAGNOSIS — S32050A Wedge compression fracture of fifth lumbar vertebra, initial encounter for closed fracture: Secondary | ICD-10-CM

## 2013-12-13 DIAGNOSIS — M549 Dorsalgia, unspecified: Secondary | ICD-10-CM

## 2013-12-14 DIAGNOSIS — Z79899 Other long term (current) drug therapy: Secondary | ICD-10-CM | POA: Diagnosis not present

## 2013-12-14 DIAGNOSIS — K5900 Constipation, unspecified: Secondary | ICD-10-CM | POA: Diagnosis not present

## 2013-12-14 DIAGNOSIS — Z8543 Personal history of malignant neoplasm of ovary: Secondary | ICD-10-CM | POA: Diagnosis not present

## 2013-12-14 DIAGNOSIS — C569 Malignant neoplasm of unspecified ovary: Secondary | ICD-10-CM | POA: Diagnosis not present

## 2013-12-14 DIAGNOSIS — Z9221 Personal history of antineoplastic chemotherapy: Secondary | ICD-10-CM | POA: Diagnosis not present

## 2013-12-14 DIAGNOSIS — E785 Hyperlipidemia, unspecified: Secondary | ICD-10-CM | POA: Diagnosis not present

## 2013-12-14 DIAGNOSIS — Z888 Allergy status to other drugs, medicaments and biological substances status: Secondary | ICD-10-CM | POA: Diagnosis not present

## 2013-12-14 DIAGNOSIS — K3189 Other diseases of stomach and duodenum: Secondary | ICD-10-CM | POA: Diagnosis not present

## 2013-12-14 DIAGNOSIS — Z9079 Acquired absence of other genital organ(s): Secondary | ICD-10-CM | POA: Diagnosis not present

## 2013-12-14 DIAGNOSIS — Z881 Allergy status to other antibiotic agents status: Secondary | ICD-10-CM | POA: Diagnosis not present

## 2013-12-14 DIAGNOSIS — I059 Rheumatic mitral valve disease, unspecified: Secondary | ICD-10-CM | POA: Diagnosis not present

## 2013-12-14 DIAGNOSIS — Z883 Allergy status to other anti-infective agents status: Secondary | ICD-10-CM | POA: Diagnosis not present

## 2013-12-14 DIAGNOSIS — R112 Nausea with vomiting, unspecified: Secondary | ICD-10-CM | POA: Diagnosis not present

## 2013-12-14 DIAGNOSIS — F411 Generalized anxiety disorder: Secondary | ICD-10-CM | POA: Diagnosis not present

## 2013-12-14 DIAGNOSIS — D6959 Other secondary thrombocytopenia: Secondary | ICD-10-CM | POA: Diagnosis not present

## 2013-12-14 DIAGNOSIS — I890 Lymphedema, not elsewhere classified: Secondary | ICD-10-CM | POA: Diagnosis not present

## 2013-12-14 DIAGNOSIS — Z882 Allergy status to sulfonamides status: Secondary | ICD-10-CM | POA: Diagnosis not present

## 2013-12-14 DIAGNOSIS — R1013 Epigastric pain: Secondary | ICD-10-CM | POA: Diagnosis not present

## 2013-12-14 DIAGNOSIS — Z880 Allergy status to penicillin: Secondary | ICD-10-CM | POA: Diagnosis not present

## 2013-12-14 DIAGNOSIS — Z7982 Long term (current) use of aspirin: Secondary | ICD-10-CM | POA: Diagnosis not present

## 2013-12-20 DIAGNOSIS — R0609 Other forms of dyspnea: Secondary | ICD-10-CM | POA: Diagnosis not present

## 2013-12-20 DIAGNOSIS — R0602 Shortness of breath: Secondary | ICD-10-CM | POA: Diagnosis not present

## 2013-12-20 DIAGNOSIS — J984 Other disorders of lung: Secondary | ICD-10-CM | POA: Diagnosis not present

## 2013-12-20 DIAGNOSIS — R197 Diarrhea, unspecified: Secondary | ICD-10-CM | POA: Diagnosis not present

## 2013-12-20 DIAGNOSIS — R079 Chest pain, unspecified: Secondary | ICD-10-CM | POA: Diagnosis not present

## 2013-12-20 DIAGNOSIS — Z7982 Long term (current) use of aspirin: Secondary | ICD-10-CM | POA: Diagnosis not present

## 2013-12-20 DIAGNOSIS — R918 Other nonspecific abnormal finding of lung field: Secondary | ICD-10-CM | POA: Diagnosis not present

## 2013-12-20 DIAGNOSIS — M7989 Other specified soft tissue disorders: Secondary | ICD-10-CM | POA: Diagnosis not present

## 2013-12-20 DIAGNOSIS — C569 Malignant neoplasm of unspecified ovary: Secondary | ICD-10-CM | POA: Diagnosis not present

## 2013-12-25 DIAGNOSIS — R918 Other nonspecific abnormal finding of lung field: Secondary | ICD-10-CM | POA: Diagnosis not present

## 2013-12-25 DIAGNOSIS — N281 Cyst of kidney, acquired: Secondary | ICD-10-CM | POA: Diagnosis not present

## 2013-12-25 DIAGNOSIS — C569 Malignant neoplasm of unspecified ovary: Secondary | ICD-10-CM | POA: Diagnosis not present

## 2013-12-28 DIAGNOSIS — C569 Malignant neoplasm of unspecified ovary: Secondary | ICD-10-CM | POA: Diagnosis not present

## 2013-12-28 DIAGNOSIS — Z87891 Personal history of nicotine dependence: Secondary | ICD-10-CM | POA: Diagnosis not present

## 2013-12-28 DIAGNOSIS — Z90710 Acquired absence of both cervix and uterus: Secondary | ICD-10-CM | POA: Diagnosis not present

## 2013-12-28 DIAGNOSIS — Z9079 Acquired absence of other genital organ(s): Secondary | ICD-10-CM | POA: Diagnosis not present

## 2013-12-28 DIAGNOSIS — R0609 Other forms of dyspnea: Secondary | ICD-10-CM | POA: Diagnosis not present

## 2013-12-28 DIAGNOSIS — R000 Tachycardia, unspecified: Secondary | ICD-10-CM | POA: Diagnosis not present

## 2013-12-28 DIAGNOSIS — R079 Chest pain, unspecified: Secondary | ICD-10-CM | POA: Diagnosis not present

## 2013-12-28 DIAGNOSIS — R0989 Other specified symptoms and signs involving the circulatory and respiratory systems: Secondary | ICD-10-CM | POA: Diagnosis not present

## 2013-12-28 DIAGNOSIS — F411 Generalized anxiety disorder: Secondary | ICD-10-CM | POA: Diagnosis not present

## 2013-12-28 DIAGNOSIS — I059 Rheumatic mitral valve disease, unspecified: Secondary | ICD-10-CM | POA: Diagnosis not present

## 2013-12-28 DIAGNOSIS — E860 Dehydration: Secondary | ICD-10-CM | POA: Diagnosis not present

## 2013-12-28 DIAGNOSIS — Z79899 Other long term (current) drug therapy: Secondary | ICD-10-CM | POA: Diagnosis not present

## 2013-12-28 DIAGNOSIS — R5381 Other malaise: Secondary | ICD-10-CM | POA: Diagnosis not present

## 2013-12-28 DIAGNOSIS — Z7982 Long term (current) use of aspirin: Secondary | ICD-10-CM | POA: Diagnosis not present

## 2013-12-28 DIAGNOSIS — R5383 Other fatigue: Secondary | ICD-10-CM | POA: Diagnosis not present

## 2013-12-28 DIAGNOSIS — R609 Edema, unspecified: Secondary | ICD-10-CM | POA: Diagnosis not present

## 2013-12-28 DIAGNOSIS — E785 Hyperlipidemia, unspecified: Secondary | ICD-10-CM | POA: Diagnosis not present

## 2013-12-28 DIAGNOSIS — Z9889 Other specified postprocedural states: Secondary | ICD-10-CM | POA: Diagnosis not present

## 2013-12-28 DIAGNOSIS — R197 Diarrhea, unspecified: Secondary | ICD-10-CM | POA: Diagnosis not present

## 2013-12-29 DIAGNOSIS — F411 Generalized anxiety disorder: Secondary | ICD-10-CM | POA: Diagnosis not present

## 2013-12-29 DIAGNOSIS — Z5181 Encounter for therapeutic drug level monitoring: Secondary | ICD-10-CM | POA: Diagnosis not present

## 2013-12-29 DIAGNOSIS — R1314 Dysphagia, pharyngoesophageal phase: Secondary | ICD-10-CM | POA: Diagnosis not present

## 2013-12-29 DIAGNOSIS — E782 Mixed hyperlipidemia: Secondary | ICD-10-CM | POA: Diagnosis not present

## 2013-12-29 DIAGNOSIS — Z1331 Encounter for screening for depression: Secondary | ICD-10-CM | POA: Diagnosis not present

## 2013-12-29 DIAGNOSIS — E785 Hyperlipidemia, unspecified: Secondary | ICD-10-CM | POA: Diagnosis not present

## 2013-12-29 DIAGNOSIS — J449 Chronic obstructive pulmonary disease, unspecified: Secondary | ICD-10-CM | POA: Diagnosis not present

## 2013-12-29 DIAGNOSIS — C569 Malignant neoplasm of unspecified ovary: Secondary | ICD-10-CM | POA: Diagnosis not present

## 2014-01-11 DIAGNOSIS — R5381 Other malaise: Secondary | ICD-10-CM | POA: Diagnosis not present

## 2014-01-11 DIAGNOSIS — C569 Malignant neoplasm of unspecified ovary: Secondary | ICD-10-CM | POA: Diagnosis not present

## 2014-01-11 DIAGNOSIS — R5383 Other fatigue: Secondary | ICD-10-CM | POA: Diagnosis not present

## 2014-01-11 DIAGNOSIS — Z9221 Personal history of antineoplastic chemotherapy: Secondary | ICD-10-CM | POA: Diagnosis not present

## 2014-01-11 DIAGNOSIS — R918 Other nonspecific abnormal finding of lung field: Secondary | ICD-10-CM | POA: Diagnosis not present

## 2014-01-23 ENCOUNTER — Ambulatory Visit: Payer: Medicare Other

## 2014-02-05 ENCOUNTER — Ambulatory Visit
Admission: RE | Admit: 2014-02-05 | Discharge: 2014-02-05 | Disposition: A | Discharge status: Home / Self Care | Payer: Medicare Other | Source: Ambulatory Visit

## 2014-02-05 DIAGNOSIS — Z1231 Encounter for screening mammogram for malignant neoplasm of breast: Secondary | ICD-10-CM

## 2014-02-22 ENCOUNTER — Other Ambulatory Visit (HOSPITAL_COMMUNITY): Payer: Self-pay | Admitting: Interventional Radiology

## 2014-02-22 DIAGNOSIS — M549 Dorsalgia, unspecified: Secondary | ICD-10-CM

## 2014-02-23 ENCOUNTER — Ambulatory Visit (HOSPITAL_COMMUNITY): Admission: RE | Admit: 2014-02-23 | Payer: Medicare Other | Source: Ambulatory Visit

## 2014-02-26 ENCOUNTER — Ambulatory Visit (HOSPITAL_COMMUNITY): Payer: Medicare Other

## 2014-02-26 ENCOUNTER — Ambulatory Visit (HOSPITAL_COMMUNITY): Admission: RE | Admit: 2014-02-26 | Payer: Medicare Other | Source: Ambulatory Visit

## 2014-02-27 ENCOUNTER — Ambulatory Visit (HOSPITAL_COMMUNITY)
Admission: RE | Admit: 2014-02-27 | Discharge: 2014-02-27 | Disposition: A | Discharge status: Home / Self Care | Payer: Medicare Other | Source: Ambulatory Visit | Attending: Interventional Radiology | Admitting: Interventional Radiology

## 2014-02-27 DIAGNOSIS — M4185 Other forms of scoliosis, thoracolumbar region: Secondary | ICD-10-CM | POA: Diagnosis not present

## 2014-02-27 DIAGNOSIS — M549 Dorsalgia, unspecified: Secondary | ICD-10-CM

## 2014-02-27 DIAGNOSIS — Z8543 Personal history of malignant neoplasm of ovary: Secondary | ICD-10-CM | POA: Diagnosis not present

## 2014-02-27 DIAGNOSIS — M5136 Other intervertebral disc degeneration, lumbar region: Secondary | ICD-10-CM | POA: Insufficient documentation

## 2014-02-27 DIAGNOSIS — M4804 Spinal stenosis, thoracic region: Secondary | ICD-10-CM | POA: Diagnosis not present

## 2014-02-27 DIAGNOSIS — M419 Scoliosis, unspecified: Secondary | ICD-10-CM | POA: Diagnosis not present

## 2014-02-27 DIAGNOSIS — M47814 Spondylosis without myelopathy or radiculopathy, thoracic region: Secondary | ICD-10-CM | POA: Diagnosis not present

## 2014-02-27 DIAGNOSIS — M5124 Other intervertebral disc displacement, thoracic region: Secondary | ICD-10-CM | POA: Diagnosis not present

## 2014-02-27 DIAGNOSIS — M4856XA Collapsed vertebra, not elsewhere classified, lumbar region, initial encounter for fracture: Secondary | ICD-10-CM | POA: Diagnosis not present

## 2014-02-27 DIAGNOSIS — M545 Low back pain: Secondary | ICD-10-CM | POA: Diagnosis present

## 2014-02-27 DIAGNOSIS — M47816 Spondylosis without myelopathy or radiculopathy, lumbar region: Secondary | ICD-10-CM | POA: Diagnosis not present

## 2014-02-27 DIAGNOSIS — M479 Spondylosis, unspecified: Secondary | ICD-10-CM | POA: Diagnosis not present

## 2014-03-01 ENCOUNTER — Encounter (HOSPITAL_COMMUNITY): Payer: Self-pay | Admitting: Pharmacy Technician

## 2014-03-01 ENCOUNTER — Other Ambulatory Visit: Payer: Self-pay | Admitting: Radiology

## 2014-03-01 ENCOUNTER — Other Ambulatory Visit (HOSPITAL_COMMUNITY): Payer: Self-pay | Admitting: Interventional Radiology

## 2014-03-01 DIAGNOSIS — M545 Low back pain: Secondary | ICD-10-CM

## 2014-03-01 DIAGNOSIS — M549 Dorsalgia, unspecified: Secondary | ICD-10-CM

## 2014-03-01 DIAGNOSIS — M5137 Other intervertebral disc degeneration, lumbosacral region: Secondary | ICD-10-CM

## 2014-03-01 DIAGNOSIS — IMO0002 Reserved for concepts with insufficient information to code with codable children: Secondary | ICD-10-CM

## 2014-03-01 DIAGNOSIS — C569 Malignant neoplasm of unspecified ovary: Secondary | ICD-10-CM

## 2014-03-01 DIAGNOSIS — M8008XG Age-related osteoporosis with current pathological fracture, vertebra(e), subsequent encounter for fracture with delayed healing: Secondary | ICD-10-CM

## 2014-03-01 DIAGNOSIS — S32050G Wedge compression fracture of fifth lumbar vertebra, subsequent encounter for fracture with delayed healing: Secondary | ICD-10-CM

## 2014-03-02 ENCOUNTER — Ambulatory Visit (HOSPITAL_COMMUNITY)
Admission: RE | Admit: 2014-03-02 | Discharge: 2014-03-02 | Disposition: A | Discharge status: Home / Self Care | Payer: Medicare Other | Source: Ambulatory Visit | Attending: Interventional Radiology | Admitting: Interventional Radiology

## 2014-03-02 ENCOUNTER — Encounter (HOSPITAL_COMMUNITY): Payer: Self-pay

## 2014-03-02 DIAGNOSIS — M4856XA Collapsed vertebra, not elsewhere classified, lumbar region, initial encounter for fracture: Secondary | ICD-10-CM | POA: Insufficient documentation

## 2014-03-02 DIAGNOSIS — S32050G Wedge compression fracture of fifth lumbar vertebra, subsequent encounter for fracture with delayed healing: Secondary | ICD-10-CM

## 2014-03-02 DIAGNOSIS — Z79899 Other long term (current) drug therapy: Secondary | ICD-10-CM | POA: Insufficient documentation

## 2014-03-02 DIAGNOSIS — Z7982 Long term (current) use of aspirin: Secondary | ICD-10-CM | POA: Insufficient documentation

## 2014-03-02 DIAGNOSIS — M5137 Other intervertebral disc degeneration, lumbosacral region: Secondary | ICD-10-CM

## 2014-03-02 DIAGNOSIS — Z8543 Personal history of malignant neoplasm of ovary: Secondary | ICD-10-CM | POA: Insufficient documentation

## 2014-03-02 DIAGNOSIS — M549 Dorsalgia, unspecified: Secondary | ICD-10-CM | POA: Diagnosis present

## 2014-03-02 DIAGNOSIS — C569 Malignant neoplasm of unspecified ovary: Secondary | ICD-10-CM

## 2014-03-02 LAB — BASIC METABOLIC PANEL
Anion gap: 16 — ABNORMAL HIGH (ref 5–15)
BUN: 17 mg/dL (ref 6–23)
CO2: 25 mEq/L (ref 19–32)
CREATININE: 1 mg/dL (ref 0.50–1.10)
Calcium: 9.7 mg/dL (ref 8.4–10.5)
Chloride: 101 mEq/L (ref 96–112)
GFR calc non Af Amer: 57 mL/min — ABNORMAL LOW (ref >90)
GFR, EST AFRICAN AMERICAN: 67 mL/min — AB (ref >90)
GLUCOSE: 99 mg/dL (ref 70–99)
Potassium: 3.8 mEq/L (ref 3.7–5.3)
Sodium: 142 mEq/L (ref 137–147)

## 2014-03-02 LAB — CBC WITH DIFFERENTIAL/PLATELET
BASOS ABS: 0 10*3/uL (ref 0.0–0.1)
Basophils Relative: 0 % (ref 0–1)
EOS PCT: 1 % (ref 0–5)
Eosinophils Absolute: 0.1 10*3/uL (ref 0.0–0.7)
HEMATOCRIT: 38.3 % (ref 36.0–46.0)
Hemoglobin: 12.6 g/dL (ref 12.0–15.0)
LYMPHS PCT: 40 % (ref 12–46)
Lymphs Abs: 2.2 10*3/uL (ref 0.7–4.0)
MCH: 29.2 pg (ref 26.0–34.0)
MCHC: 32.9 g/dL (ref 30.0–36.0)
MCV: 88.7 fL (ref 78.0–100.0)
MONO ABS: 0.4 10*3/uL (ref 0.1–1.0)
Monocytes Relative: 6 % (ref 3–12)
Neutro Abs: 2.9 10*3/uL (ref 1.7–7.7)
Neutrophils Relative %: 53 % (ref 43–77)
Platelets: 265 10*3/uL (ref 150–400)
RBC: 4.32 MIL/uL (ref 3.87–5.11)
RDW: 13.6 % (ref 11.5–15.5)
WBC: 5.5 10*3/uL (ref 4.0–10.5)

## 2014-03-02 LAB — PROTIME-INR
INR: 0.91 (ref 0.00–1.49)
Prothrombin Time: 12.3 seconds (ref 11.6–15.2)

## 2014-03-02 MED ORDER — HYDROMORPHONE HCL 1 MG/ML IJ SOLN
INTRAMUSCULAR | Status: AC | PRN
  Administered 2014-03-02: 1 mg via INTRAVENOUS

## 2014-03-02 MED ORDER — FENTANYL CITRATE 0.05 MG/ML IJ SOLN
INTRAMUSCULAR | Status: AC | PRN
  Administered 2014-03-02 (×4): 25 ug via INTRAVENOUS

## 2014-03-02 MED ORDER — SODIUM CHLORIDE 0.9 % IV SOLN
INTRAVENOUS | Status: DC
  Administered 2014-03-02: 11:00:00 via INTRAVENOUS

## 2014-03-02 MED ORDER — VANCOMYCIN HCL IN DEXTROSE 1-5 GM/200ML-% IV SOLN
1000.0000 mg | Freq: Once | INTRAVENOUS | Status: AC
  Administered 2014-03-02: 1000 mg via INTRAVENOUS
  Filled 2014-03-02: qty 200

## 2014-03-02 MED ORDER — SODIUM CHLORIDE 0.9 % IV SOLN: INTRAVENOUS | Status: AC

## 2014-03-02 MED ORDER — BUPIVACAINE HCL (PF) 0.25 % IJ SOLN
INTRAMUSCULAR | Status: AC
  Filled 2014-03-02: qty 30

## 2014-03-02 MED ORDER — TOBRAMYCIN SULFATE 1.2 G IJ SOLR
INTRAMUSCULAR | Status: AC
  Filled 2014-03-02: qty 1.2

## 2014-03-02 MED ORDER — MIDAZOLAM HCL 2 MG/2ML IJ SOLN
INTRAMUSCULAR | Status: AC
  Filled 2014-03-02: qty 2

## 2014-03-02 MED ORDER — FENTANYL CITRATE 0.05 MG/ML IJ SOLN
INTRAMUSCULAR | Status: AC
  Filled 2014-03-02: qty 4

## 2014-03-02 MED ORDER — HYDROMORPHONE HCL 1 MG/ML IJ SOLN
INTRAMUSCULAR | Status: AC
  Filled 2014-03-02: qty 1

## 2014-03-02 MED ORDER — MIDAZOLAM HCL 2 MG/2ML IJ SOLN
INTRAMUSCULAR | Status: AC | PRN
  Administered 2014-03-02 (×4): 1 mg via INTRAVENOUS

## 2014-03-02 MED ORDER — GELATIN ABSORBABLE 12-7 MM EX MISC
CUTANEOUS | Status: AC
  Filled 2014-03-02: qty 1

## 2014-03-02 MED ORDER — MIDAZOLAM HCL 2 MG/2ML IJ SOLN
INTRAMUSCULAR | Status: AC
  Filled 2014-03-02: qty 4

## 2014-03-02 NOTE — Discharge Instructions (Signed)
1.No stooping,bending or lifting more than 10 lbds for 2 weeks. 2 Use walker  For 2 weeks . 3.RTC in 2 weeks KYPHOPLASTY/VERTEBROPLASTY DISCHARGE INSTRUCTIONS  Medications: (check all that apply)     Resume all home medications as before procedure.       Resume your (aspirin/Plavix/Coumadin) on                 Continue your pain medications as prescribed as needed.  Over the next 3-5 days, decrease your pain medication as tolerated.  Over the counter medications (i.e. Tylenol, ibuprofen, and aleve) may be substituted once severe/moderate pain symptoms have subsided.   Wound Care:   Bandages may be removed the day following your procedure.  You may get your incision wet once bandages are removed.  Bandaids may be used to cover the incisions until scab formation.  Topical ointments are optional.    If you develop a fever greater than 101 degrees, have increased skin redness at the incision sites or pus-like oozing from incisions occurring within 1 week of the procedure, contact radiology at [phone] or [phone].    Ice pack to back for 15-20 minutes 2-3 time per day for first 2-3 days post procedure.  The ice will expedite muscle healing and help with the pain from the incisions.   Activity:   Bedrest today with limited activity for 24 hours post procedure.    No driving for 48 hours.    Increase your activity as tolerated after bedrest (with assistance if necessary).    Refrain from any strenuous activity or heavy lifting (greater than 10 lbs.).   Follow up:   Contact radiology at [phone] or [phone] if any questions/concerns.    A physician assistant from radiology will contact you in approximately 1 week.    If a biopsy was performed at the time of your procedure, your referring physician should receive the results in usually 2-3 days.

## 2014-03-02 NOTE — Procedures (Signed)
S/P L5 VP 

## 2014-03-02 NOTE — Sedation Documentation (Signed)
C/o pain down right leg with burning all the way to toes and coldness

## 2014-03-02 NOTE — H&P (Signed)
Chief Complaint: Continued back pain  Referring Physician(s): Deveshwar,Sanjeev K  History of Present Illness: [name] is a 68 y.o. female  Hx ovarian cancer Procedure Lumbar 4 and 5 tumor ablation and augmentation performed 12/21/2013 Did well for weeks but pain recurred and worsened New MRI 10/20 reveals acute edema at pedicle of Lumbar 5 Pt now scheduled for additional cement injection  Past Medical History  Diagnosis Date  . Mitral valve prolapse 03-10-13    rare palpitations  . Anxiety   . Depression   . Lymph edema 03-10-13    left leg( hip to foot)-consistent-remains an issue 08-21-13  . GERD (gastroesophageal reflux disease)   . Seizures     '70- x1 post Phenergan IV for nausea during pregnancy  . Wrist fracture     11'14-casted only,no surgery-no problems now  . Nodule of neck 08-21-13    Chemotherapy to start this week for this  . Portacath in place 08-21-13    left chest remains  . Cancer     '10-Ovarian cancer(tx. surgery, chemotherapy) -residual lymph edema lt. leg,restart of chemotherapy this week-Dr. Verne Spurr)    Past Surgical History  Procedure Laterality Date  . Abdominal hysterectomy      '79-no cancer  . Laparotomy for staging / restaging      '10- [facility] (Dr.Nycum)-Dr. Pippitt, oncology- surgery Ovarian Cancer  . Appendectomy      '10  with Ovarian staging surgery  . Cataract extraction, bilateral Bilateral   . Tubal ligation    . Fracture surgery Right     '12 -[facility] Rt. shoulder  . Portacath placement      has left chest pac  . Angioplasty  2009  . Foot surgery      tendon many yrs ago    Allergies: Carboplatin; Phenergan; Clindamycin/lincomycin; Penicillins; Prochlorperazine; and Sulfa antibiotics  Medications: Prior to Admission medications   Medication Sig Start Date End Date Taking? Authorizing Provider  aspirin EC 81 MG tablet Take 81 mg by mouth daily.   Yes Historical Provider, MD  buPROPion (WELLBUTRIN  XL) 300 MG 24 hr tablet Take 300 mg by mouth every morning.   Yes Historical Provider, MD  DULoxetine (CYMBALTA) 30 MG capsule Take 30 mg by mouth every evening.   Yes Historical Provider, MD  LORazepam (ATIVAN) 1 MG tablet Take 1 mg by mouth 2 (two) times daily as needed for anxiety.   Yes Historical Provider, MD  Multiple Vitamin (MULTIVITAMIN WITH MINERALS) TABS tablet Take 1 tablet by mouth daily.   Yes Historical Provider, MD  pantoprazole (PROTONIX) 40 MG tablet Take 40 mg by mouth 2 (two) times daily.   Yes Historical Provider, MD  simvastatin (ZOCOR) 20 MG tablet Take 20 mg by mouth every evening.   Yes Historical Provider, MD  zolpidem (AMBIEN) 5 MG tablet Take 5 mg by mouth at bedtime as needed for sleep.   Yes Historical Provider, MD    No family history on file.  History   Social History  . Marital Status: Married    Spouse Name: N/A    Number of Children: N/A  . Years of Education: N/A   Social History Main Topics  . Smoking status: Former Smoker -- 1.50 packs/day for 13 years    Types: Cigarettes    Quit date: 03/10/1986  . Smokeless tobacco: Never Used  . Alcohol Use: No  . Drug Use: No  . Sexual Activity: None   Other Topics Concern  . None  Social History Narrative  . None    Review of Systems: A 12 point ROS discussed and pertinent positives are indicated in the HPI above.  All other systems are negative.  Review of Systems  Constitutional: Positive for activity change. Negative for fever and unexpected weight change.  Respiratory: Negative for cough, chest tightness and shortness of breath.   Cardiovascular: Negative for chest pain.  Gastrointestinal: Negative for abdominal pain.  Musculoskeletal: Positive for back pain.  Neurological: Negative for dizziness, weakness and headaches.  Psychiatric/Behavioral: Negative for behavioral problems and confusion.    Vital Signs: BP 150/65  Pulse 76  Temp(Src) 98.2 F (36.8 C) (Oral)  Resp 20  Ht 5' 3.5"  (1.613 m)  Wt 74.844 kg (165 lb)  BMI 28.77 kg/m2  SpO2 100%  Physical Exam  Constitutional: She is oriented to person, place, and time. She appears well-developed and well-nourished.  Cardiovascular: Normal rate, regular rhythm and normal heart sounds.   No murmur heard. Pulmonary/Chest: Effort normal and breath sounds normal. She has no wheezes.  Abdominal: Soft. Bowel sounds are normal. She exhibits no distension. There is no tenderness.  Musculoskeletal: Normal range of motion.  Low back pain  Neurological: She is alert and oriented to person, place, and time.  Skin: Skin is warm and dry.  Psychiatric: She has a normal mood and affect. Her behavior is normal. Judgment and thought content normal.    Imaging: Mr Thoracic Spine Wo Contrast  02/27/2014   CLINICAL DATA:  Low back pain radiating to the right hip in ankle. Lower lumbar compression fractures. History of ovarian cancer.  EXAM: MRI THORACIC AND LUMBAR SPINE WITHOUT CONTRAST  TECHNIQUE: Multiplanar and multiecho pulse sequences of the thoracic and lumbar spine were obtained without intravenous contrast.  COMPARISON:  Chest CT 08/03/2009; abdominal CT 09/05/2013  FINDINGS: MR THORACIC SPINE FINDINGS  Levoconvex thoracic scoliosis. No significant abnormal spinal cord signal is observed. Degenerative endplate findings and disc bulge observed at C6-7 at the extreme upper margin of parasagittal images; I cannot comment on foraminal impingement at this level due to exclusion of the foramina.  No thoracic vertebral edema or fracture observed. No significant thoracic spine malalignment. Additional findings at individual levels are as follows:  C7-T1: Mild right foraminal stenosis due to uncinate and facet spurring.  T1-2:  Unremarkable.  T2-3: Unremarkable. Incidental small hemangioma posteriorly in the T3 vertebral body.  T3-4:  No impingement.  Mild left facet arthropathy.  T4-5:  Borderline left foraminal stenosis due to facet arthropathy.   T5-6:  Unremarkable.  T6-7:  Unremarkable.  T7- 8:  Unremarkable.  T8- 9: No impingement. Small Schmorl's node along the inferior endplate of T 8.  B0-48:  Unremarkable.  T10-11:  Unremarkable.  T11-12:  Unremarkable.  T12-L1:  No impingement.  Broad right paracentral disc protrusion.  MR LUMBAR SPINE FINDINGS  Dextroconvex lumbar scoliosis with rotary component.  Vertebral augmentations observed at L4 and L5, with a maximum of about 50% loss of height at L4 and 30% loss of height at L5 due to superior and inferior endplate compressions, respectively. Low grade edema noted centrally in the vertebral body at L4 adjacent to the methacrylate, and in the right L5 pedicle, transverse process, and along the right side of the L5-S1 endplates. A tiny amount of methacrylate may extend into the right L5-S1 disc. No methacrylate extends into the epidural space. Indistinct right pars interarticularis at L5, query incipient pars defect. The right transverse process appears to seed articulate with the  sacrum.  There is 3 mm degenerative retrolisthesis at L1-2 and 4 mm degenerative retrolisthesis at L2-3 with type 2 degenerative endplate findings at P2-9. I do not see a new fracture. Small Tarlov cysts are present in the sacrum.  Additional findings at individual levels are as follows:  L1-2:  No impingement.  Diffuse disc bulge.  L2-3: Mild bilateral foraminal stenosis and mild left subarticular lateral recess stenosis due to disc bulge and facet arthropathy. Small bilateral facet joint effusions.  L3-4: Mild left foraminal stenosis and mild left subarticular lateral recess stenosis due to disc bulge.  L4-5: Moderate right and mild left foraminal stenosis due to disc bulge and right greater than left facet arthropathy. Possible left foraminal annular tear versus spur extension along the margin of the disc.  L5-S1: Moderate right foraminal stenosis due to disc bulge, facet arthropathy, and intervertebral spurring.  IMPRESSION: 1.  Prior vertebral augmentations at L4 and L5. There is some edema in the right L5 pedicle and transverse process thought to be likely due to a combination of incipient right pars defect and probably also the pseudoarticulation between the transverse process in the top of the sacrum. 2. Spondylosis and degenerative disc disease, causing moderate impingement at the L4-5 and L5-S1 levels; mild impingement at C7-T1, L2-3, and L3-4; and borderline impingement at T4-5, as detailed above. 3. Levoconvex thoracic scoliosis with dextroconvex lumbar scoliosis, with rotary components.   Electronically Signed   By: Sherryl Barters M.D.   On: 02/27/2014 09:46   Mr Lumbar Spine Wo Contrast  02/27/2014   CLINICAL DATA:  Low back pain radiating to the right hip in ankle. Lower lumbar compression fractures. History of ovarian cancer.  EXAM: MRI THORACIC AND LUMBAR SPINE WITHOUT CONTRAST  TECHNIQUE: Multiplanar and multiecho pulse sequences of the thoracic and lumbar spine were obtained without intravenous contrast.  COMPARISON:  Chest CT 08/03/2009; abdominal CT 09/05/2013  FINDINGS: MR THORACIC SPINE FINDINGS  Levoconvex thoracic scoliosis. No significant abnormal spinal cord signal is observed. Degenerative endplate findings and disc bulge observed at C6-7 at the extreme upper margin of parasagittal images; I cannot comment on foraminal impingement at this level due to exclusion of the foramina.  No thoracic vertebral edema or fracture observed. No significant thoracic spine malalignment. Additional findings at individual levels are as follows:  C7-T1: Mild right foraminal stenosis due to uncinate and facet spurring.  T1-2:  Unremarkable.  T2-3: Unremarkable. Incidental small hemangioma posteriorly in the T3 vertebral body.  T3-4:  No impingement.  Mild left facet arthropathy.  T4-5:  Borderline left foraminal stenosis due to facet arthropathy.  T5-6:  Unremarkable.  T6-7:  Unremarkable.  T7- 8:  Unremarkable.  T8- 9: No  impingement. Small Schmorl's node along the inferior endplate of T 8.  J1-88:  Unremarkable.  T10-11:  Unremarkable.  T11-12:  Unremarkable.  T12-L1:  No impingement.  Broad right paracentral disc protrusion.  MR LUMBAR SPINE FINDINGS  Dextroconvex lumbar scoliosis with rotary component.  Vertebral augmentations observed at L4 and L5, with a maximum of about 50% loss of height at L4 and 30% loss of height at L5 due to superior and inferior endplate compressions, respectively. Low grade edema noted centrally in the vertebral body at L4 adjacent to the methacrylate, and in the right L5 pedicle, transverse process, and along the right side of the L5-S1 endplates. A tiny amount of methacrylate may extend into the right L5-S1 disc. No methacrylate extends into the epidural space. Indistinct right pars interarticularis at L5,  query incipient pars defect. The right transverse process appears to seed articulate with the sacrum.  There is 3 mm degenerative retrolisthesis at L1-2 and 4 mm degenerative retrolisthesis at L2-3 with type 2 degenerative endplate findings at E1-7. I do not see a new fracture. Small Tarlov cysts are present in the sacrum.  Additional findings at individual levels are as follows:  L1-2:  No impingement.  Diffuse disc bulge.  L2-3: Mild bilateral foraminal stenosis and mild left subarticular lateral recess stenosis due to disc bulge and facet arthropathy. Small bilateral facet joint effusions.  L3-4: Mild left foraminal stenosis and mild left subarticular lateral recess stenosis due to disc bulge.  L4-5: Moderate right and mild left foraminal stenosis due to disc bulge and right greater than left facet arthropathy. Possible left foraminal annular tear versus spur extension along the margin of the disc.  L5-S1: Moderate right foraminal stenosis due to disc bulge, facet arthropathy, and intervertebral spurring.  IMPRESSION: 1. Prior vertebral augmentations at L4 and L5. There is some edema in the right  L5 pedicle and transverse process thought to be likely due to a combination of incipient right pars defect and probably also the pseudoarticulation between the transverse process in the top of the sacrum. 2. Spondylosis and degenerative disc disease, causing moderate impingement at the L4-5 and L5-S1 levels; mild impingement at C7-T1, L2-3, and L3-4; and borderline impingement at T4-5, as detailed above. 3. Levoconvex thoracic scoliosis with dextroconvex lumbar scoliosis, with rotary components.   Electronically Signed   By: Sherryl Barters M.D.   On: 02/27/2014 09:46   Mm Screening Breast Tomo Bilateral  02/06/2014   CLINICAL DATA:  Screening.  EXAM: DIGITAL SCREENING BILATERAL MAMMOGRAM WITH 3D TOMO WITH CAD  COMPARISON:  Previous exam(s).  ACR Breast Density Category b: There are scattered areas of fibroglandular density.  FINDINGS: There are no findings suspicious for malignancy. Images were processed with CAD.  IMPRESSION: No mammographic evidence of malignancy. A result letter of this screening mammogram will be mailed directly to the patient.  RECOMMENDATION: Screening mammogram in one year. (Code:SM-B-01Y)  BI-RADS CATEGORY  1: Negative.   Electronically Signed   By: Conchita Paris M.D.   On: 02/06/2014 14:40    Labs:  CBC:  Recent Labs  09/05/13 0700 09/07/13 1223 09/08/13 0415 12/12/13 0800  WBC 6.3 3.1* 3.3* 3.2*  HGB 11.2* 9.1* 9.1* 9.1*  HCT 34.8* 26.9* 27.8* 27.5*  PLT 156 84* 77* 174    COAGS:  Recent Labs  12/12/13 0800  INR 0.94  APTT 29    BMP:  Recent Labs  09/07/13 0325 09/08/13 0415 09/09/13 0450 12/12/13 0800  NA 140 138 143 141  K 2.9* 3.8 4.7 3.4*  CL 102 102 104 101  CO2 29 29 34* 28  GLUCOSE 92 87 94 92  BUN 8 5* 4* 9  CALCIUM 8.4 8.6 8.7 8.9  CREATININE 0.97 0.95 0.95 0.87  GFRNONAA 60* 61* 61* 68*  GFRAA 69* 71* 71* 79*    LIVER FUNCTION TESTS:  Recent Labs  09/05/13 0058 09/05/13 0700 09/07/13 1220  BILITOT 0.4 0.4 0.2*  AST 29  25 26   ALT 17 15 14   ALKPHOS 93 80 60  PROT 7.7 6.6 6.0  ALBUMIN 3.5 3.0* 2.7*    TUMOR MARKERS: No results found for this basename: AFPTM, CEA, CA199, CHROMGRNA,  in the last 8760 hours  Assessment and Plan:  Previous L4 and 5 tumor ablation and vertebral augmentation performed 12/2013 Continued back  pain- worsening New MRI shows acute edema L5 pedicle Now for additional cement injection to area Pt aware of procedure benefits and risks and agreeable to proceed Consent signed andin chart  Thank you for this interesting consult.  I greatly enjoyed meeting VICCI REDER and look forward to participating in their care.    I spent a total of 20 minutes face to face in clinical consultation, greater than 50% of which was counseling/coordinating care for L5 pedicle additional cement injection  Signed: Prescott Truex A 03/02/2014, 11:12 AM

## 2014-03-06 DIAGNOSIS — J309 Allergic rhinitis, unspecified: Secondary | ICD-10-CM | POA: Diagnosis not present

## 2014-03-06 DIAGNOSIS — K219 Gastro-esophageal reflux disease without esophagitis: Secondary | ICD-10-CM | POA: Diagnosis not present

## 2014-03-06 DIAGNOSIS — G479 Sleep disorder, unspecified: Secondary | ICD-10-CM | POA: Diagnosis not present

## 2014-03-06 DIAGNOSIS — E782 Mixed hyperlipidemia: Secondary | ICD-10-CM | POA: Diagnosis not present

## 2014-03-06 DIAGNOSIS — M899 Disorder of bone, unspecified: Secondary | ICD-10-CM | POA: Diagnosis not present

## 2014-03-06 DIAGNOSIS — C569 Malignant neoplasm of unspecified ovary: Secondary | ICD-10-CM | POA: Diagnosis not present

## 2014-03-06 DIAGNOSIS — N182 Chronic kidney disease, stage 2 (mild): Secondary | ICD-10-CM | POA: Diagnosis not present

## 2014-03-06 DIAGNOSIS — F329 Major depressive disorder, single episode, unspecified: Secondary | ICD-10-CM | POA: Diagnosis not present

## 2014-03-13 ENCOUNTER — Other Ambulatory Visit (HOSPITAL_COMMUNITY): Payer: Medicare Other

## 2014-03-16 ENCOUNTER — Other Ambulatory Visit (HOSPITAL_COMMUNITY): Payer: Self-pay | Admitting: Interventional Radiology

## 2014-03-16 ENCOUNTER — Telehealth (HOSPITAL_COMMUNITY): Payer: Self-pay | Admitting: Interventional Radiology

## 2014-03-16 DIAGNOSIS — IMO0002 Reserved for concepts with insufficient information to code with codable children: Secondary | ICD-10-CM

## 2014-03-16 NOTE — Telephone Encounter (Signed)
Called pt, left VM for her to call to schedule f/u visit JM

## 2014-03-27 ENCOUNTER — Ambulatory Visit (HOSPITAL_COMMUNITY)
Admission: RE | Admit: 2014-03-27 | Discharge: 2014-03-27 | Disposition: A | Discharge status: Home / Self Care | Payer: Medicare Other | Source: Ambulatory Visit | Attending: Interventional Radiology | Admitting: Interventional Radiology

## 2014-03-27 DIAGNOSIS — M545 Low back pain: Secondary | ICD-10-CM | POA: Diagnosis not present

## 2014-03-27 DIAGNOSIS — Z4789 Encounter for other orthopedic aftercare: Secondary | ICD-10-CM | POA: Diagnosis not present

## 2014-03-27 DIAGNOSIS — IMO0002 Reserved for concepts with insufficient information to code with codable children: Secondary | ICD-10-CM

## 2014-03-30 DIAGNOSIS — E785 Hyperlipidemia, unspecified: Secondary | ICD-10-CM | POA: Diagnosis not present

## 2014-03-30 DIAGNOSIS — N182 Chronic kidney disease, stage 2 (mild): Secondary | ICD-10-CM | POA: Diagnosis not present

## 2014-04-12 DIAGNOSIS — Z7982 Long term (current) use of aspirin: Secondary | ICD-10-CM | POA: Diagnosis not present

## 2014-04-12 DIAGNOSIS — E785 Hyperlipidemia, unspecified: Secondary | ICD-10-CM | POA: Diagnosis not present

## 2014-04-12 DIAGNOSIS — L989 Disorder of the skin and subcutaneous tissue, unspecified: Secondary | ICD-10-CM | POA: Diagnosis not present

## 2014-04-12 DIAGNOSIS — Z880 Allergy status to penicillin: Secondary | ICD-10-CM | POA: Diagnosis not present

## 2014-04-12 DIAGNOSIS — Z79899 Other long term (current) drug therapy: Secondary | ICD-10-CM | POA: Diagnosis not present

## 2014-04-12 DIAGNOSIS — C569 Malignant neoplasm of unspecified ovary: Secondary | ICD-10-CM | POA: Diagnosis not present

## 2014-04-12 DIAGNOSIS — R634 Abnormal weight loss: Secondary | ICD-10-CM | POA: Diagnosis not present

## 2014-04-12 DIAGNOSIS — Z882 Allergy status to sulfonamides status: Secondary | ICD-10-CM | POA: Diagnosis not present

## 2014-04-24 DIAGNOSIS — D2272 Melanocytic nevi of left lower limb, including hip: Secondary | ICD-10-CM | POA: Diagnosis not present

## 2014-04-24 DIAGNOSIS — C44719 Basal cell carcinoma of skin of left lower limb, including hip: Secondary | ICD-10-CM | POA: Diagnosis not present

## 2014-04-24 DIAGNOSIS — D485 Neoplasm of uncertain behavior of skin: Secondary | ICD-10-CM | POA: Diagnosis not present

## 2014-05-09 DIAGNOSIS — Z23 Encounter for immunization: Secondary | ICD-10-CM | POA: Diagnosis not present

## 2014-05-23 DIAGNOSIS — C44719 Basal cell carcinoma of skin of left lower limb, including hip: Secondary | ICD-10-CM | POA: Diagnosis not present

## 2014-05-29 DIAGNOSIS — I10 Essential (primary) hypertension: Secondary | ICD-10-CM | POA: Diagnosis not present

## 2014-05-29 DIAGNOSIS — G4719 Other hypersomnia: Secondary | ICD-10-CM | POA: Diagnosis not present

## 2014-05-29 DIAGNOSIS — E785 Hyperlipidemia, unspecified: Secondary | ICD-10-CM | POA: Diagnosis not present

## 2014-05-29 DIAGNOSIS — E669 Obesity, unspecified: Secondary | ICD-10-CM | POA: Diagnosis not present

## 2014-06-07 DIAGNOSIS — F329 Major depressive disorder, single episode, unspecified: Secondary | ICD-10-CM | POA: Diagnosis not present

## 2014-06-07 DIAGNOSIS — F54 Psychological and behavioral factors associated with disorders or diseases classified elsewhere: Secondary | ICD-10-CM | POA: Diagnosis not present

## 2014-06-07 DIAGNOSIS — E669 Obesity, unspecified: Secondary | ICD-10-CM | POA: Diagnosis not present

## 2014-07-03 DIAGNOSIS — F329 Major depressive disorder, single episode, unspecified: Secondary | ICD-10-CM | POA: Diagnosis not present

## 2014-07-03 DIAGNOSIS — E669 Obesity, unspecified: Secondary | ICD-10-CM | POA: Diagnosis not present

## 2014-07-03 DIAGNOSIS — F54 Psychological and behavioral factors associated with disorders or diseases classified elsewhere: Secondary | ICD-10-CM | POA: Diagnosis not present

## 2014-07-06 DIAGNOSIS — I10 Essential (primary) hypertension: Secondary | ICD-10-CM | POA: Diagnosis not present

## 2014-07-06 DIAGNOSIS — E785 Hyperlipidemia, unspecified: Secondary | ICD-10-CM | POA: Diagnosis not present

## 2014-07-06 DIAGNOSIS — E669 Obesity, unspecified: Secondary | ICD-10-CM | POA: Diagnosis not present

## 2014-07-12 DIAGNOSIS — R634 Abnormal weight loss: Secondary | ICD-10-CM | POA: Diagnosis not present

## 2014-07-12 DIAGNOSIS — C44712 Basal cell carcinoma of skin of right lower limb, including hip: Secondary | ICD-10-CM | POA: Diagnosis not present

## 2014-07-12 DIAGNOSIS — I890 Lymphedema, not elsewhere classified: Secondary | ICD-10-CM | POA: Diagnosis not present

## 2014-07-12 DIAGNOSIS — C569 Malignant neoplasm of unspecified ovary: Secondary | ICD-10-CM | POA: Diagnosis not present

## 2014-07-12 DIAGNOSIS — Z6832 Body mass index (BMI) 32.0-32.9, adult: Secondary | ICD-10-CM | POA: Diagnosis not present

## 2014-07-12 DIAGNOSIS — Z8543 Personal history of malignant neoplasm of ovary: Secondary | ICD-10-CM | POA: Diagnosis not present

## 2014-08-06 DIAGNOSIS — E782 Mixed hyperlipidemia: Secondary | ICD-10-CM | POA: Diagnosis not present

## 2014-08-06 DIAGNOSIS — F329 Major depressive disorder, single episode, unspecified: Secondary | ICD-10-CM | POA: Diagnosis not present

## 2014-08-06 DIAGNOSIS — N182 Chronic kidney disease, stage 2 (mild): Secondary | ICD-10-CM | POA: Diagnosis not present

## 2014-08-06 DIAGNOSIS — G479 Sleep disorder, unspecified: Secondary | ICD-10-CM | POA: Diagnosis not present

## 2014-08-15 DIAGNOSIS — C569 Malignant neoplasm of unspecified ovary: Secondary | ICD-10-CM | POA: Diagnosis not present

## 2014-08-15 DIAGNOSIS — Z452 Encounter for adjustment and management of vascular access device: Secondary | ICD-10-CM | POA: Diagnosis not present

## 2014-08-15 DIAGNOSIS — F329 Major depressive disorder, single episode, unspecified: Secondary | ICD-10-CM | POA: Diagnosis not present

## 2014-08-15 DIAGNOSIS — R531 Weakness: Secondary | ICD-10-CM | POA: Diagnosis not present

## 2014-08-15 DIAGNOSIS — E785 Hyperlipidemia, unspecified: Secondary | ICD-10-CM | POA: Diagnosis not present

## 2014-08-15 DIAGNOSIS — E669 Obesity, unspecified: Secondary | ICD-10-CM | POA: Diagnosis not present

## 2014-08-16 DIAGNOSIS — C569 Malignant neoplasm of unspecified ovary: Secondary | ICD-10-CM | POA: Diagnosis not present

## 2014-08-16 DIAGNOSIS — Z8572 Personal history of non-Hodgkin lymphomas: Secondary | ICD-10-CM | POA: Diagnosis not present

## 2014-08-16 DIAGNOSIS — Z452 Encounter for adjustment and management of vascular access device: Secondary | ICD-10-CM | POA: Diagnosis not present

## 2014-08-16 DIAGNOSIS — Z8542 Personal history of malignant neoplasm of other parts of uterus: Secondary | ICD-10-CM | POA: Diagnosis not present

## 2014-08-16 DIAGNOSIS — Z08 Encounter for follow-up examination after completed treatment for malignant neoplasm: Secondary | ICD-10-CM | POA: Diagnosis not present

## 2014-08-23 DIAGNOSIS — Z452 Encounter for adjustment and management of vascular access device: Secondary | ICD-10-CM | POA: Diagnosis not present

## 2014-08-23 DIAGNOSIS — C569 Malignant neoplasm of unspecified ovary: Secondary | ICD-10-CM | POA: Diagnosis not present

## 2014-09-11 ENCOUNTER — Emergency Department (HOSPITAL_COMMUNITY)
Admission: EM | Admit: 2014-09-11 | Discharge: 2014-09-11 | Disposition: A | Discharge status: Home / Self Care | Payer: Medicare Other

## 2014-09-11 NOTE — ED Notes (Signed)
Called for triage x2

## 2014-09-19 DIAGNOSIS — C44729 Squamous cell carcinoma of skin of left lower limb, including hip: Secondary | ICD-10-CM | POA: Diagnosis not present

## 2014-09-19 DIAGNOSIS — D485 Neoplasm of uncertain behavior of skin: Secondary | ICD-10-CM | POA: Diagnosis not present

## 2014-10-01 ENCOUNTER — Ambulatory Visit: Payer: Medicare Other | Attending: Dermatology | Admitting: Physical Therapy

## 2014-10-04 ENCOUNTER — Other Ambulatory Visit: Payer: Self-pay | Admitting: Gastroenterology

## 2014-10-09 DIAGNOSIS — Z452 Encounter for adjustment and management of vascular access device: Secondary | ICD-10-CM | POA: Diagnosis not present

## 2014-10-09 DIAGNOSIS — C569 Malignant neoplasm of unspecified ovary: Secondary | ICD-10-CM | POA: Diagnosis not present

## 2014-10-11 ENCOUNTER — Encounter (HOSPITAL_COMMUNITY): Payer: Self-pay | Admitting: *Deleted

## 2014-10-16 ENCOUNTER — Ambulatory Visit: Payer: Medicare Other | Attending: Dermatology | Admitting: Physical Therapy

## 2014-10-16 DIAGNOSIS — L989 Disorder of the skin and subcutaneous tissue, unspecified: Secondary | ICD-10-CM | POA: Diagnosis not present

## 2014-10-16 DIAGNOSIS — I890 Lymphedema, not elsewhere classified: Secondary | ICD-10-CM | POA: Diagnosis not present

## 2014-10-16 DIAGNOSIS — R239 Unspecified skin changes: Secondary | ICD-10-CM

## 2014-10-16 NOTE — Therapy (Signed)
[place], [place], [address] Phone: [phone]   Fax:  [phone]  Physical Therapy Evaluation  Patient Details  Name: [name] MRN: [id number] Date of Birth: [birth year] Referring Provider:  Barrie Folk,*  Encounter Date: 10/16/2014      PT End of Session - 10/16/14 1454    Visit Number 1   Number of Visits 25   Date for PT Re-Evaluation 12/15/14   PT Start Time 2094   PT Stop Time 1400   PT Time Calculation (min) 55 min   Activity Tolerance Patient tolerated treatment well   Behavior During Therapy [facility] for tasks assessed/performed      Past Medical History  Diagnosis Date  . Mitral valve prolapse 03-10-13    rare palpitations  . Anxiety   . Depression   . Lymph edema 03-10-13    left leg( hip to foot)-consistent-remains an issue 10-11-14-wears compression hose  . GERD (gastroesophageal reflux disease)   . Seizures     '70- x1 post Phenergan IV for nausea during pregnancy  . Wrist fracture     11'14-casted only,no surgery-no problems now  . Nodule of neck 08-21-13    Chemotherapy to start this week for this(10-11-14 was told a yr ago- couldn't feel anything after chemo tx.)  . Portacath in place 08-21-13    left chest remains as of 10-11-14  . COPD (chronic obstructive pulmonary disease)     mild  . Transfusion history     during chemotherapy, none recent  . History of hiatal hernia   . Cancer     '10-Ovarian cancer(tx. surgery, chemotherapy) -residual lymph edema lt. leg,restart of chemotherapy this week-Dr. Verne Spurr)  . Squamous cell skin cancer     left leg- one area recently excised, anther area is planned    Past Surgical History  Procedure Laterality Date  . Abdominal hysterectomy      '79-no cancer  . Laparotomy for staging / restaging      '10- [facility] (Dr.Nycum)-Dr. Pippitt, oncology- surgery Ovarian Cancer  . Appendectomy      '10  with  Ovarian staging surgery  . Cataract extraction, bilateral Bilateral   . Tubal ligation    . Fracture surgery Right     '12 -ORIF Rt. shoulder  . Portacath placement      has left chest pac  . Angioplasty  2009  . Foot surgery      tendon many yrs ago  . Cardiac catheterization      There were no vitals filed for this visit.  Visit Diagnosis:  Lymphedema of left lower extremity - Plan: PT plan of care cert/re-cert  Impaired skin integrity - Plan: PT plan of care cert/re-cert      Subjective Assessment - 10/16/14 1312    Subjective Dr. Danielle Dess referred me.  Had ovarian cancer in 2010 with surgery and chemotherapy; two trial drugs.  Recurrence in 2014.  Lymphedema as described below.   Pertinent History Had ovarian cancer in 2010 with surgery (including groin lymph nodes but patient unsure how many) and chemotherapy; two trial drugs.  Recurrence in 2014.  Had chemo again.  Right now in remission.  After first surgery a very large lymph node came up in the groin; did punch biopsies that were inconclusisve, then that node was removed.  Lymphedema started around 2012.  Did see a therapist in [place] for that; was disappointed in that treatment--"they did not have many answers for  lymphedema then."  Had an orthopedic brace and wraps.  Also tried the stockings. Was sent to a neurologist and got a pump through him.  Uses that every day; says it helped at first but she thinks she could endure more pressure on the leg. It is a Biocompression 3008. Now having skin cancers come up on leg.                                                                                      Patient Stated Goals I'd like to have a normal leg.  The size seems to be getting bigger--would like to reduce the size.  Would like to prevent other cancers coming up.  "To keep from it getting worse."   Currently in Pain? Yes   Pain Score 3    Pain Location Leg   Pain Orientation Left;Distal;Lateral;Lower   Pain Descriptors /  Indicators Other (Comment);Constant  like a fresh cut   Aggravating Factors  rubbed with a loofah   Pain Relieving Factors nothing   Effect of Pain on Daily Activities shaving legs is difficult; sun on the leg            [facility] PT Assessment - 10/16/14 0001    Assessment   Medical Diagnosis h/o ovarian cancer and more recent skin cancers on left (lymphedematous) leg   Onset Date/Surgical Date 05/23/14  skin cancer   Precautions   Precautions Other (comment)   Precaution Comments cancer precautions   Restrictions   Weight Bearing Restrictions No   Balance Screen   Has the patient fallen in the past 6 months No  but has fallen mutliple times, she thinks because of lymphed   Has the patient had a decrease in activity level because of a fear of falling?  No   Is the patient reluctant to leave their home because of a fear of falling?  No   Home Environment   Living Environment Private residence   Living Arrangements Spouse/significant other  but spouse has h/o serious medical problems   Prior Function   Level of Independence Independent   Leisure Likes to garden, paint--involves needing to get up and down off the floor, which is difficult.   Observation/Other Assessments   Observations well appearing woman with a left leg that is much larger than her right   Skin Integrity multiple weeping wounds on left leg   Posture/Postural Control   Posture/Postural Control No significant limitations   ROM / Strength   AROM / PROM / Strength --  not tested, but functional for both AROM and strength           LYMPHEDEMA/ONCOLOGY QUESTIONNAIRE - 10/16/14 1306    Type   Cancer Type ovarian, skin cancers on left leg (basal cell, squamous cell)   Surgeries   Other Surgery Date --  2010; recurrence in 2014   Treatment   Past Chemotherapy Treatment Yes   What other symptoms do you have   Other Symptoms fibrosis in left leg, particularly firm at ankle   Lymphedema Stage   Stage STAGE 2  SPONTANEOUSLY IRREVERSIBLE   Lymphedema Assessments   Lymphedema Assessments Lower extremities  Right Lower Extremity Lymphedema   20 cm Proximal to Suprapatella 60 cm   10 cm Proximal to Suprapatella 49.2 cm   At Midpatella/Popliteal Crease 39.4 cm   30 cm Proximal to Floor at Lateral Plantar Foot 34.1 cm   20 cm Proximal to Floor at Lateral Plantar Foot 23 1   10  cm Proximal to Floor at Lateral Malleoli 20.8 cm   5 cm Proximal to 1st MTP Joint 21.6 cm   Across MTP Joint 22 cm   Around Proximal Great Toe 6.7 cm   Left Lower Extremity Lymphedema   20 cm Proximal to Suprapatella 76 cm   10 cm Proximal to Suprapatella 68.9 cm   At Midpatella/Popliteal Crease 50.5 cm   30 cm Proximal to Floor at Lateral Plantar Foot 46.2 cm   20 cm Proximal to Floor at Lateral Plantar Foot 31.3 cm   10 cm Proximal to Floor at Lateral Malleoli 27.3 cm   5 cm Proximal to 1st MTP Joint 24.8 cm   Across MTP Joint 22.7 cm   Around Proximal Great Toe 7.5 cm                           Short Term Clinic Goals - 10/16/14 1503    CC Short Term Goal  #1   Title decrease circumference of left leg at 20 cm. proximal to suprapatellar border by at least 4 cm.   Baseline 76 cm. on left compared to 60 on right   Time 4   Period Weeks   Status New   CC Short Term Goal  #2   Title decrease circumference of left leg at 30 cm. proximal to plantar surface of foot (lateral aspect) by at least 4 cm.   Baseline 46.2 cm. on left compared to 34.1 on right   Time 4   Period Weeks   Status New   CC Short Term Goal  #3   Title patient's compression pump adjusted if appropriate for higher compression   Baseline Has Biocompression pump at home.   Time 4   Period Weeks   Status New             Long Term Clinic Goals - 10/16/14 1506    CC Long Term Goal  #1   Title decrease circumference of left leg at 20 cm. proximal to suprapatellar border by at least 7 cm.   Baseline 76 cm. on left compared  to 60 on right   Time 8   Period Weeks   Status New   CC Long Term Goal  #2   Title decrease circumference of left leg at 30 cm. proximal to plantar surface of foot (lateral aspect) by at least 7 cm.   Baseline 46.2 cm. on left compared to 34.1 on right   Time 8   Period Weeks   Status New   CC Long Term Goal  #3   Title knowledgeable about available nighttime compression garments, where and how to obtain if desired   Time 6   Period Weeks   Status New            Plan - 10/16/14 1455    Clinical Impression Statement Patient with h/o ovarian cancer + recurrence dating back to 2010; now with basal cell and squamous cell cancers being treated on left lower leg.  Has weeping wounds there and dermatologist feels wounds won't heal if she is not treated for lymphedema.  She was treated in the past and has some equipment in place (compression stockings and a pump), but still has a very large left leg compared to right, and leg is fibrotic.  Discussed with patient that we should be able to reduce her leg's volume but that it will require rigorous 3x/week therapy with bandaging (she has done bandaging in the past and was not happy with it).  She agrees to do whatever it takes now to get a reduction.  She is otherwise very functional and has a husband with several significant health issues (brittle diabetes, h/o serious MI, CVAs) whom she cares for at home.   Pt will benefit from skilled therapeutic intervention in order to improve on the following deficits Increased edema;Decreased knowledge of use of DME;Decreased skin integrity   Rehab Potential Good   PT Frequency 3x / week   PT Duration 8 weeks  prn   PT Treatment/Interventions Manual lymph drainage;Compression bandaging;Therapeutic exercise;Patient/family education;DME Instruction;Other (comment)  consider nighttime compression garment; assist with getting pump compression adjusted if appropriate   PT Next Visit Plan Begin complete  decongestive therapy for left LE; later, discuss nighttime compression garments; see about pump compression adjustment.   Recommended Other Services consult [employer] rep re: pump adjustment if appropriate   Consulted and Agree with Plan of Care Patient          G-Codes - 11-14-14 1508    Functional Assessment Tool Used clinical judgement   Functional Limitation Self care   Self Care Current Status [phone]) At least 40 percent but less than 60 percent impaired, limited or restricted   Self Care Goal Status ([id number]) At least 20 percent but less than 40 percent impaired, limited or restricted       Problem List Patient Active Problem List   Diagnosis Date Noted  . Hypokalemia 09/07/2013  . SBO (small bowel obstruction) 09/05/2013  . Ovarian ca 09/05/2013  . Dyslipidemia 09/05/2013  . Depression   . Anxiety   . GERD (gastroesophageal reflux disease)     Jailyn Leeson 2014/11/14, 3:13 PM  [place], [place], [address] Phone: [phone]   Fax:  [place], PT 14-Nov-2014 3:14 PM

## 2014-10-18 ENCOUNTER — Encounter (HOSPITAL_COMMUNITY): Payer: Self-pay | Admitting: Anesthesiology

## 2014-10-18 ENCOUNTER — Ambulatory Visit (HOSPITAL_COMMUNITY)
Admission: RE | Admit: 2014-10-18 | Discharge: 2014-10-18 | Disposition: A | Discharge status: Home / Self Care | Payer: Medicare Other | Source: Ambulatory Visit | Attending: Gastroenterology | Admitting: Gastroenterology

## 2014-10-18 ENCOUNTER — Encounter (HOSPITAL_COMMUNITY)
Admission: RE | Disposition: A | Discharge status: Home / Self Care | Payer: Self-pay | Source: Ambulatory Visit | Attending: Gastroenterology

## 2014-10-18 ENCOUNTER — Ambulatory Visit (HOSPITAL_COMMUNITY): Payer: Medicare Other | Admitting: Anesthesiology

## 2014-10-18 DIAGNOSIS — J449 Chronic obstructive pulmonary disease, unspecified: Secondary | ICD-10-CM | POA: Diagnosis not present

## 2014-10-18 DIAGNOSIS — Z1211 Encounter for screening for malignant neoplasm of colon: Secondary | ICD-10-CM | POA: Diagnosis not present

## 2014-10-18 DIAGNOSIS — K219 Gastro-esophageal reflux disease without esophagitis: Secondary | ICD-10-CM | POA: Diagnosis not present

## 2014-10-18 DIAGNOSIS — I341 Nonrheumatic mitral (valve) prolapse: Secondary | ICD-10-CM | POA: Insufficient documentation

## 2014-10-18 DIAGNOSIS — Z90710 Acquired absence of both cervix and uterus: Secondary | ICD-10-CM | POA: Diagnosis not present

## 2014-10-18 DIAGNOSIS — Z9049 Acquired absence of other specified parts of digestive tract: Secondary | ICD-10-CM | POA: Diagnosis not present

## 2014-10-18 DIAGNOSIS — Z8543 Personal history of malignant neoplasm of ovary: Secondary | ICD-10-CM | POA: Insufficient documentation

## 2014-10-18 DIAGNOSIS — K635 Polyp of colon: Secondary | ICD-10-CM | POA: Diagnosis not present

## 2014-10-18 DIAGNOSIS — D124 Benign neoplasm of descending colon: Secondary | ICD-10-CM | POA: Diagnosis not present

## 2014-10-18 DIAGNOSIS — Z87891 Personal history of nicotine dependence: Secondary | ICD-10-CM | POA: Diagnosis not present

## 2014-10-18 DIAGNOSIS — Z90722 Acquired absence of ovaries, bilateral: Secondary | ICD-10-CM | POA: Diagnosis not present

## 2014-10-18 HISTORY — PX: COLONOSCOPY WITH PROPOFOL: SHX5780

## 2014-10-18 HISTORY — DX: Squamous cell carcinoma of skin, unspecified: C44.92

## 2014-10-18 HISTORY — DX: Personal history of other diseases of the digestive system: Z87.19

## 2014-10-18 HISTORY — DX: Chronic obstructive pulmonary disease, unspecified: J44.9

## 2014-10-18 SURGERY — COLONOSCOPY WITH PROPOFOL
Anesthesia: Monitor Anesthesia Care

## 2014-10-18 MED ORDER — LACTATED RINGERS IV SOLN
INTRAVENOUS | Status: DC
  Administered 2014-10-18: 1000 mL via INTRAVENOUS

## 2014-10-18 MED ORDER — PROPOFOL INFUSION 10 MG/ML OPTIME
INTRAVENOUS | Status: DC | PRN
  Administered 2014-10-18: 120 ug/kg/min via INTRAVENOUS

## 2014-10-18 MED ORDER — PROPOFOL 10 MG/ML IV BOLUS
INTRAVENOUS | Status: AC
  Filled 2014-10-18: qty 20

## 2014-10-18 MED ORDER — LIDOCAINE HCL (CARDIAC) 20 MG/ML IV SOLN
INTRAVENOUS | Status: AC
  Filled 2014-10-18: qty 5

## 2014-10-18 MED ORDER — KETAMINE HCL 10 MG/ML IJ SOLN
INTRAMUSCULAR | Status: DC | PRN
Start: 2014-10-18 — End: 2014-10-18
  Administered 2014-10-18 (×2): 10 mg via INTRAVENOUS

## 2014-10-18 MED ORDER — PROPOFOL 10 MG/ML IV BOLUS
INTRAVENOUS | Status: DC | PRN
  Administered 2014-10-18: 30 mg via INTRAVENOUS
  Administered 2014-10-18 (×3): 20 mg via INTRAVENOUS

## 2014-10-18 SURGICAL SUPPLY — 22 items

## 2014-10-18 NOTE — Transfer of Care (Signed)
Immediate Anesthesia Transfer of Care Note  Patient: [name]  Procedure(s) Performed: Procedure(s): COLONOSCOPY WITH PROPOFOL (N/A)  Patient Location: PACU  Anesthesia Type:MAC  Level of Consciousness:  sedated, patient cooperative and responds to stimulation  Airway & Oxygen Therapy:Patient Spontanous Breathing and Patient connected to face mask oxgen  Post-op Assessment:  Report given to PACU RN and Post -op Vital signs reviewed and stable  Post vital signs:  Reviewed and stable  Last Vitals:  Filed Vitals:   10/18/14 1132  BP: 156/61  Pulse: 82  Temp: 36.9 C  Resp: 14    Complications: No apparent anesthesia complications

## 2014-10-18 NOTE — Anesthesia Preprocedure Evaluation (Addendum)
Anesthesia Evaluation  Patient identified by MRN, date of birth, ID band Patient awake  General Assessment Comment: Mitral valve prolapse 03-10-13   rare palpitations . Anxiety  . Depression  . Lymph edema 03-10-13   left leg( hip to foot)-consistent-remains an issue 08-21-13 . GERD (gastroesophageal reflux disease)  . Seizures    '70- x1 post Phenergan IV for nausea during pregnancy . Wrist fracture    11'14-casted only,no surgery-no problems now . Nodule of neck 08-21-13   Chemotherapy to start this week for this . Portacath in place 08-21-13   left chest remains . Cancer    '10-Ovarian cancer(tx. surgery, chemotherapy) -residual lymph edema lt. leg,restart of chemotherapy this week-Dr. Verne Spurr)        Reviewed: Allergy & Precautions, NPO status , Patient's Chart, lab work & pertinent test results  Airway Mallampati: II  TM Distance: >3 FB Neck ROM: Full    Dental no notable dental hx.    Pulmonary COPDformer smoker,  breath sounds clear to auscultation  Pulmonary exam normal       Cardiovascular negative cardio ROS Normal cardiovascular examRhythm:Regular Rate:Normal     Neuro/Psych Seizures -, Well Controlled,  PSYCHIATRIC DISORDERS Anxiety Depression Only one seizure many years ago when she was pregnant.    GI/Hepatic Neg liver ROS, hiatal hernia, GERD-  ,  Endo/Other  negative endocrine ROS  Renal/GU negative Renal ROS  negative genitourinary   Musculoskeletal negative musculoskeletal ROS (+)   Abdominal   Peds negative pediatric ROS (+)  Hematology negative hematology ROS (+)   Anesthesia Other Findings   Reproductive/Obstetrics negative OB ROS                           Anesthesia Physical Anesthesia Plan  ASA: II  Anesthesia Plan: MAC   Post-op Pain Management:    Induction: Intravenous  Airway  Management Planned:   Additional Equipment:   Intra-op Plan:   Post-operative Plan:   Informed Consent: I have reviewed the patients History and Physical, chart, labs and discussed the procedure including the risks, benefits and alternatives for the proposed anesthesia with the patient or authorized representative who has indicated his/her understanding and acceptance.   Dental advisory given  Plan Discussed with: CRNA  Anesthesia Plan Comments:         Anesthesia Quick Evaluation

## 2014-10-18 NOTE — Discharge Instructions (Signed)

## 2014-10-18 NOTE — Op Note (Signed)
Procedure: Screening colonoscopy. Normal screening colonoscopy performed on 02/12/2003  Endoscopist: Earle Gell  Premedication: Propofol administered by anesthesia  Procedure: The patient was placed in the left lateral decubitus position. Anal inspection and digital rectal exam were normal. The Pentax pediatric colonoscope was introduced into the rectum and advanced to the cecum. A normal-appearing appendiceal orifice and ileocecal valve were identified. Colonic preparation for the exam today was good. Withdrawal time was 15 minutes  Rectum. Normal. Retroflexed view of the distal rectum was normal  Sigmoid colon. Normal  Descending colon. From the distal descending colon, a 5 mm sessile polyp was removed with the cold snare  Splenic flexure. Normal  Transverse colon. Normal  Hepatic flexure. Normal  Ascending colon. Normal  Cecum and ileocecal valve. Normal  Assessment: A small polyp was removed from the distal descending colon; otherwise normal colonoscopy  Recommendation: If the polyp returns adenomatous pathologically, the patient should undergo a surveillance colonoscopy in 5 years

## 2014-10-18 NOTE — H&P (Signed)
  Procedure: Screening colonoscopy. Normal screening colonoscopy performed on 02/12/2003  History: The patient is a 69 year old female born [birth year]. She is scheduled to undergo a repeat screening colonoscopy today.  Past medical history: Ovarian cancer. Hysterectomy, BSO, appendectomy, the bulking of omentum, partial bowel resection. Bilateral cataract surgery. Fractured humerus surgery. Hypercholesterolemia. Mitral valve prolapse syndrome. Endometriosis.  Medication allergies: Penicillin. Phenergan.  Exam: The patient is alert and lying comfortably on the endoscopy stretcher. Abdomen is soft and nontender to palpation. Lungs are clear to auscultation. Cardiac exam reveals a regular rhythm.  Plan: Proceed with screening colonoscopy

## 2014-10-18 NOTE — Anesthesia Postprocedure Evaluation (Signed)
  Anesthesia Post-op Note  Patient: [name]  Procedure(s) Performed: Procedure(s) (LRB): COLONOSCOPY WITH PROPOFOL (N/A)  Patient Location: PACU  Anesthesia Type: MAC  Level of Consciousness: awake and alert   Airway and Oxygen Therapy: Patient Spontanous Breathing  Post-op Pain: mild  Post-op Assessment: Post-op Vital signs reviewed, Patient's Cardiovascular Status Stable, Respiratory Function Stable, Patent Airway and No signs of Nausea or vomiting  Last Vitals:  Filed Vitals:   10/18/14 1305  BP:   Pulse: 81  Temp:   Resp: 15    Post-op Vital Signs: stable   Complications: No apparent anesthesia complications

## 2014-10-19 ENCOUNTER — Encounter (HOSPITAL_COMMUNITY): Payer: Self-pay | Admitting: Gastroenterology

## 2014-10-31 ENCOUNTER — Ambulatory Visit: Payer: Medicare Other | Admitting: Physical Therapy

## 2014-10-31 DIAGNOSIS — R239 Unspecified skin changes: Secondary | ICD-10-CM

## 2014-10-31 DIAGNOSIS — I890 Lymphedema, not elsewhere classified: Secondary | ICD-10-CM

## 2014-10-31 DIAGNOSIS — L989 Disorder of the skin and subcutaneous tissue, unspecified: Secondary | ICD-10-CM | POA: Diagnosis not present

## 2014-10-31 NOTE — Therapy (Signed)
[place], [place], [address] Phone: [phone]   Fax:  [phone]  Physical Therapy Treatment  Patient Details  Name: [name] MRN: [id number] Date of Birth: 10/01/1945 Referring Provider:  Shirline Frees, MD  Encounter Date: 10/31/2014      PT End of Session - 10/31/14 1252    Visit Number 2   Number of Visits 25   Date for PT Re-Evaluation 12/15/14   PT Start Time 0109   PT Stop Time 1012   PT Time Calculation (min) 75 min   Activity Tolerance Patient tolerated treatment well   Behavior During Therapy [facility] for tasks assessed/performed      Past Medical History  Diagnosis Date  . Mitral valve prolapse 03-10-13    rare palpitations  . Anxiety   . Depression   . Lymph edema 03-10-13    left leg( hip to foot)-consistent-remains an issue 10-11-14-wears compression hose  . GERD (gastroesophageal reflux disease)   . Seizures     '70- x1 post Phenergan IV for nausea during pregnancy  . Wrist fracture     11'14-casted only,no surgery-no problems now  . Nodule of neck 08-21-13    Chemotherapy to start this week for this(10-11-14 was told a yr ago- couldn't feel anything after chemo tx.)  . Portacath in place 08-21-13    left chest remains as of 10-11-14  . COPD (chronic obstructive pulmonary disease)     mild  . Transfusion history     during chemotherapy, none recent  . History of hiatal hernia   . Cancer     '10-Ovarian cancer(tx. surgery, chemotherapy) -residual lymph edema lt. leg,restart of chemotherapy this week-Dr. Verne Spurr)  . Squamous cell skin cancer     left leg- one area recently excised, anther area is planned    Past Surgical History  Procedure Laterality Date  . Abdominal hysterectomy      '79-no cancer  . Laparotomy for staging / restaging      '10- [facility] (Dr.Nycum)-Dr. Pippitt, oncology- surgery Ovarian Cancer  . Appendectomy      '10  with Ovarian  staging surgery  . Cataract extraction, bilateral Bilateral   . Tubal ligation    . Fracture surgery Right     '12 -ORIF Rt. shoulder  . Portacath placement      has left chest pac  . Angioplasty  2009  . Foot surgery      tendon many yrs ago  . Cardiac catheterization    . Colonoscopy with propofol N/A 10/18/2014    Procedure: COLONOSCOPY WITH PROPOFOL;  Surgeon: Garlan Fair, MD;  Location: [facility] END[facility];  Service: Endoscopy;  Laterality: N/A;    There were no vitals filed for this visit.  Visit Diagnosis:  Lymphedema of left lower extremity  Impaired skin integrity      Subjective Assessment - 10/31/14 0859    Subjective Matt adjusted the pump, both fixing a connection and adjusting the pressure; it works much better.  "I might fall asleep on you.  My husband broke his femur and we were at the ED the other day. so I haven't gotten much sleep."  He's in the hospital; no surgery, but they can't cast it.  He'll probably go to a rehab vacility.   Currently in Pain? No/denies            [facility] PT Assessment - 10/31/14 0001    Observation/Other Assessments   Skin Integrity still with  wounds on left leg, not weeping today                     [facility] Adult PT Treatment/Exercise - 10/31/14 0001    Manual Therapy   Manual Therapy Edema management;Manual Lymphatic Drainage (MLD);Compression Bandaging   Edema Management Instructed in diaphragmatic breathing first.   Manual Lymphatic Drainage (MLD) In supine with legs elevated:  short neck, superficial and deep abdomen, right groin and left axilla; anterior interinguinal anastomosis and left inguino-axillary anastomosis; left LE from dorsal foot to lateral thigh, following along pathways.   Compression Bandaging Lotion applied, then bandaging of left LE with stockinette, Artiflex x 3, 1-6 cm., 1-8 cm., and 4-12 cm. short stretch bandages from foot to hip.                   Short Term Clinic Goals - 10/31/14  1256    CC Short Term Goal  #1   Status On-going   CC Short Term Goal  #2   Status On-going   CC Short Term Goal  #3   Status Achieved             Long Term Clinic Goals - 10/16/14 1506    CC Long Term Goal  #1   Title decrease circumference of left leg at 20 cm. proximal to suprapatellar border by at least 7 cm.   Baseline 76 cm. on left compared to 60 on right   Time 8   Period Weeks   Status New   CC Long Term Goal  #2   Title decrease circumference of left leg at 30 cm. proximal to plantar surface of foot (lateral aspect) by at least 7 cm.   Baseline 46.2 cm. on left compared to 34.1 on right   Time 8   Period Weeks   Status New   CC Long Term Goal  #3   Title knowledgeable about available nighttime compression garments, where and how to obtain if desired   Time 6   Period Weeks   Status New            Plan - 10/31/14 1253    Clinical Impression Statement Patient did well with first session.  She reports she has been taught to bandage in the past so could rebandage if bandages become loose and droopy.     Pt will benefit from skilled therapeutic intervention in order to improve on the following deficits Increased edema;Decreased knowledge of use of DME;Decreased skin integrity   Rehab Potential Good   PT Frequency 3x / week   PT Duration 8 weeks   PT Treatment/Interventions Manual lymph drainage;Compression bandaging;Patient/family education   PT Next Visit Plan Continue complete decongestive therapy for left LE.  Later, discuss nighttime compression garments.   Consulted and Agree with Plan of Care Patient        Problem List Patient Active Problem List   Diagnosis Date Noted  . Hypokalemia 09/07/2013  . SBO (small bowel obstruction) 09/05/2013  . Ovarian ca 09/05/2013  . Dyslipidemia 09/05/2013  . Depression   . Anxiety   . GERD (gastroesophageal reflux disease)     SALISBURY,DONNA 10/31/2014, 12:57 PM  [place], [place], [address] Phone: [phone]   Fax:  [place], PT 10/31/2014 12:57 PM

## 2014-10-31 NOTE — Patient Instructions (Signed)
Left LE AROM to be performed with bandages on.  Remove bandages in case of pain or numbness unrelieved with doing ROM of left leg.

## 2014-11-02 ENCOUNTER — Ambulatory Visit: Payer: Medicare Other | Admitting: Physical Therapy

## 2014-11-05 ENCOUNTER — Ambulatory Visit: Payer: Medicare Other | Admitting: Physical Therapy

## 2014-11-07 ENCOUNTER — Ambulatory Visit: Payer: Medicare Other

## 2014-11-07 ENCOUNTER — Telehealth: Payer: Self-pay

## 2014-11-07 DIAGNOSIS — C44729 Squamous cell carcinoma of skin of left lower limb, including hip: Secondary | ICD-10-CM | POA: Diagnosis not present

## 2014-11-07 NOTE — Telephone Encounter (Signed)
Pt no show today, called her, patient wont come to the phone, husband scream to pt across the room. Left message that all appts will be cancelled if she wont show up on 11/09/14

## 2014-11-09 ENCOUNTER — Encounter: Payer: Self-pay | Admitting: Physical Therapy

## 2014-11-09 ENCOUNTER — Ambulatory Visit: Payer: Medicare Other | Admitting: Physical Therapy

## 2014-11-09 ENCOUNTER — Telehealth: Payer: Self-pay | Admitting: Physical Therapy

## 2014-11-09 NOTE — Telephone Encounter (Signed)
Talked to pt and informed her that all appts has been cancelled, pt has been a no show for the last 4 appts. Maudry Diego PT is aware

## 2014-11-09 NOTE — Therapy (Signed)
[place], [place], [address] Phone: [phone]   Fax:  [phone]  Patient Details  Name: MALA GIBBARD MRN: [id number] Date of Birth: February 26, 1946 Referring Provider:  No ref. provider found  Encounter Date: 11/09/2014   Norwood Levo 11/09/2014, 11:24 AM  [place], [place], [address] Phone: [phone]   Fax:  [phone]    PHYSICAL THERAPY DISCHARGE SUMMARY  Visits from Start of Care: 2  Current functional level related to goals / functional outcomes:  PT Goals         10/16/14 1503    CC Short Term Goal #1    Title  decrease circumference of left leg at 20 cm. proximal to suprapatellar border by at least 4 cm.    Baseline  76 cm. on left compared to 60 on right    Time  4    Period  Weeks    Status  New    CC Short Term Goal #2    Title  decrease circumference of left leg at 30 cm. proximal to plantar surface of foot (lateral aspect) by at least 4 cm.    Baseline  46.2 cm. on left compared to 34.1 on right    Time  4    Period  Weeks    Status  New    CC Short Term Goal #3    Title  patient's compression pump adjusted if appropriate for higher compression    Baseline  Has Biocompression pump at home.    Time  4    Period  Weeks    Status  New        10/16/14 1505    CC Long Term Goal #1    Title  decrease circumference of left leg at 20 cm. proximal to suprapatellar border by at least 7 cm.    Time  8    Period  Weeks    Status  New        10/16/14 1506    CC Long Term Goal #1    Title  decrease circumference of left leg at 20 cm. proximal to suprapatellar border by at least 7 cm.    Baseline  76 cm. on left compared to 60 on right    Time  8    Period  Weeks    Status  New    CC Long Term Goal #2     Title  decrease circumference of left leg at 30 cm. proximal to plantar surface of foot (lateral aspect) by at least 7 cm.    Baseline  46.2 cm. on left compared to 34.1 on right    Time  8    Period  Weeks    Status  New    CC Long Term Goal #3    Title  knowledgeable about available nighttime compression garments, where and how to obtain if desired    Time  6    Period  Weeks    Status  New      10/31/14 1256    CC Short Term Goal #1           Remaining deficits: lymphedema   Education / Equipment: none Plan: Patient agrees to discharge.  Patient goals were not met. Patient is being discharged due to the physician's request.  ?????          Maudry Diego, PT 11/09/2014 11:28 AM

## 2014-11-13 ENCOUNTER — Ambulatory Visit: Payer: Medicare Other

## 2014-11-15 ENCOUNTER — Ambulatory Visit: Payer: Medicare Other

## 2014-11-15 DIAGNOSIS — F419 Anxiety disorder, unspecified: Secondary | ICD-10-CM | POA: Diagnosis not present

## 2014-11-15 DIAGNOSIS — Z90722 Acquired absence of ovaries, bilateral: Secondary | ICD-10-CM | POA: Diagnosis not present

## 2014-11-15 DIAGNOSIS — Z85828 Personal history of other malignant neoplasm of skin: Secondary | ICD-10-CM | POA: Diagnosis not present

## 2014-11-15 DIAGNOSIS — Z8543 Personal history of malignant neoplasm of ovary: Secondary | ICD-10-CM | POA: Diagnosis not present

## 2014-11-15 DIAGNOSIS — Z08 Encounter for follow-up examination after completed treatment for malignant neoplasm: Secondary | ICD-10-CM | POA: Diagnosis not present

## 2014-11-15 DIAGNOSIS — F329 Major depressive disorder, single episode, unspecified: Secondary | ICD-10-CM | POA: Diagnosis not present

## 2014-11-15 DIAGNOSIS — C569 Malignant neoplasm of unspecified ovary: Secondary | ICD-10-CM | POA: Diagnosis not present

## 2014-11-15 DIAGNOSIS — Z90710 Acquired absence of both cervix and uterus: Secondary | ICD-10-CM | POA: Diagnosis not present

## 2014-11-19 ENCOUNTER — Ambulatory Visit: Payer: Medicare Other | Admitting: Physical Therapy

## 2014-11-21 ENCOUNTER — Encounter: Payer: Medicare Other | Admitting: Physical Therapy

## 2014-11-23 ENCOUNTER — Encounter: Payer: No Typology Code available for payment source | Admitting: Physical Therapy

## 2014-12-13 DIAGNOSIS — C569 Malignant neoplasm of unspecified ovary: Secondary | ICD-10-CM | POA: Diagnosis not present

## 2014-12-13 DIAGNOSIS — Z452 Encounter for adjustment and management of vascular access device: Secondary | ICD-10-CM | POA: Diagnosis not present

## 2015-01-04 DIAGNOSIS — Z452 Encounter for adjustment and management of vascular access device: Secondary | ICD-10-CM | POA: Diagnosis not present

## 2015-01-04 DIAGNOSIS — C569 Malignant neoplasm of unspecified ovary: Secondary | ICD-10-CM | POA: Diagnosis not present

## 2015-01-21 ENCOUNTER — Other Ambulatory Visit: Payer: Self-pay

## 2015-01-21 DIAGNOSIS — Z1231 Encounter for screening mammogram for malignant neoplasm of breast: Secondary | ICD-10-CM

## 2015-02-06 DIAGNOSIS — C569 Malignant neoplasm of unspecified ovary: Secondary | ICD-10-CM | POA: Diagnosis not present

## 2015-02-13 DIAGNOSIS — I890 Lymphedema, not elsewhere classified: Secondary | ICD-10-CM | POA: Diagnosis not present

## 2015-02-13 DIAGNOSIS — E785 Hyperlipidemia, unspecified: Secondary | ICD-10-CM | POA: Diagnosis not present

## 2015-02-13 DIAGNOSIS — C569 Malignant neoplasm of unspecified ovary: Secondary | ICD-10-CM | POA: Diagnosis not present

## 2015-02-14 DIAGNOSIS — E785 Hyperlipidemia, unspecified: Secondary | ICD-10-CM | POA: Diagnosis not present

## 2015-02-14 DIAGNOSIS — I890 Lymphedema, not elsewhere classified: Secondary | ICD-10-CM | POA: Diagnosis not present

## 2015-02-14 DIAGNOSIS — C569 Malignant neoplasm of unspecified ovary: Secondary | ICD-10-CM | POA: Diagnosis not present

## 2015-02-27 ENCOUNTER — Ambulatory Visit: Payer: No Typology Code available for payment source

## 2015-03-07 ENCOUNTER — Ambulatory Visit
Admission: RE | Admit: 2015-03-07 | Discharge: 2015-03-07 | Disposition: A | Discharge status: Home / Self Care | Payer: Medicare Other | Source: Ambulatory Visit

## 2015-03-07 DIAGNOSIS — Z1231 Encounter for screening mammogram for malignant neoplasm of breast: Secondary | ICD-10-CM

## 2015-03-12 ENCOUNTER — Other Ambulatory Visit: Payer: Self-pay | Admitting: Family Medicine

## 2015-03-12 DIAGNOSIS — R928 Other abnormal and inconclusive findings on diagnostic imaging of breast: Secondary | ICD-10-CM

## 2015-03-18 ENCOUNTER — Ambulatory Visit
Admission: RE | Admit: 2015-03-18 | Discharge: 2015-03-18 | Disposition: A | Discharge status: Home / Self Care | Payer: Medicare Other | Source: Ambulatory Visit | Attending: Family Medicine | Admitting: Family Medicine

## 2015-03-18 DIAGNOSIS — R928 Other abnormal and inconclusive findings on diagnostic imaging of breast: Secondary | ICD-10-CM

## 2015-03-18 DIAGNOSIS — C44719 Basal cell carcinoma of skin of left lower limb, including hip: Secondary | ICD-10-CM | POA: Diagnosis not present

## 2015-03-18 DIAGNOSIS — N6001 Solitary cyst of right breast: Secondary | ICD-10-CM | POA: Diagnosis not present

## 2015-03-18 DIAGNOSIS — C569 Malignant neoplasm of unspecified ovary: Secondary | ICD-10-CM | POA: Diagnosis not present

## 2015-04-05 DIAGNOSIS — R918 Other nonspecific abnormal finding of lung field: Secondary | ICD-10-CM | POA: Diagnosis not present

## 2015-04-05 DIAGNOSIS — C562 Malignant neoplasm of left ovary: Secondary | ICD-10-CM | POA: Diagnosis not present

## 2015-04-05 DIAGNOSIS — R911 Solitary pulmonary nodule: Secondary | ICD-10-CM | POA: Diagnosis not present

## 2015-04-09 DIAGNOSIS — C44719 Basal cell carcinoma of skin of left lower limb, including hip: Secondary | ICD-10-CM | POA: Diagnosis not present

## 2015-04-09 DIAGNOSIS — Z90722 Acquired absence of ovaries, bilateral: Secondary | ICD-10-CM | POA: Diagnosis not present

## 2015-04-09 DIAGNOSIS — Z08 Encounter for follow-up examination after completed treatment for malignant neoplasm: Secondary | ICD-10-CM | POA: Diagnosis not present

## 2015-04-09 DIAGNOSIS — Z8543 Personal history of malignant neoplasm of ovary: Secondary | ICD-10-CM | POA: Diagnosis not present

## 2015-04-09 DIAGNOSIS — Z90710 Acquired absence of both cervix and uterus: Secondary | ICD-10-CM | POA: Diagnosis not present

## 2015-04-09 DIAGNOSIS — C569 Malignant neoplasm of unspecified ovary: Secondary | ICD-10-CM | POA: Diagnosis not present

## 2015-04-25 DIAGNOSIS — C44729 Squamous cell carcinoma of skin of left lower limb, including hip: Secondary | ICD-10-CM | POA: Diagnosis not present

## 2015-04-25 DIAGNOSIS — C44719 Basal cell carcinoma of skin of left lower limb, including hip: Secondary | ICD-10-CM | POA: Diagnosis not present

## 2015-04-25 DIAGNOSIS — D492 Neoplasm of unspecified behavior of bone, soft tissue, and skin: Secondary | ICD-10-CM | POA: Diagnosis not present

## 2015-04-25 DIAGNOSIS — Z85828 Personal history of other malignant neoplasm of skin: Secondary | ICD-10-CM | POA: Diagnosis not present

## 2015-04-25 DIAGNOSIS — I890 Lymphedema, not elsewhere classified: Secondary | ICD-10-CM | POA: Diagnosis not present

## 2015-04-29 DIAGNOSIS — C44729 Squamous cell carcinoma of skin of left lower limb, including hip: Secondary | ICD-10-CM | POA: Diagnosis not present

## 2015-05-13 ENCOUNTER — Emergency Department (INDEPENDENT_AMBULATORY_CARE_PROVIDER_SITE_OTHER)
Admission: EM | Admit: 2015-05-13 | Discharge: 2015-05-13 | Disposition: A | Discharge status: Home / Self Care | Payer: Medicare Other | Source: Home / Self Care | Attending: Family Medicine | Admitting: Family Medicine

## 2015-05-13 ENCOUNTER — Encounter (HOSPITAL_COMMUNITY): Payer: Self-pay | Admitting: Emergency Medicine

## 2015-05-13 DIAGNOSIS — J069 Acute upper respiratory infection, unspecified: Secondary | ICD-10-CM

## 2015-05-13 LAB — POCT RAPID STREP A: Streptococcus, Group A Screen (Direct): NEGATIVE

## 2015-05-13 NOTE — ED Provider Notes (Signed)
CSN: [id number]     Arrival date & time 05/13/15  1310 History   First MD Initiated Contact with Patient 05/13/15 1437     Chief Complaint  Patient presents with  . Sore Throat   (Consider location/radiation/quality/duration/timing/severity/associated sxs/prior Treatment) HPI Comments: Pleasant 70 year old female who states that she has sore throat 3 weeks ago that lasted one day. She had some drainage and congestion in her ears. She took any histamines in this eventually abated. In the past few days she has developed some drainage and cough. Denies fever, denies sore throat.  Patient is a 70 y.o. female presenting with pharyngitis.  Sore Throat Pertinent negatives include no chest pain and no shortness of breath.    Past Medical History  Diagnosis Date  . Mitral valve prolapse 03-10-13    rare palpitations  . Anxiety   . Depression   . Lymph edema 03-10-13    left leg( hip to foot)-consistent-remains an issue 10-11-14-wears compression hose  . GERD (gastroesophageal reflux disease)   . Seizures ([facility])     '70- x1 post Phenergan IV for nausea during pregnancy  . Wrist fracture     11'14-casted only,no surgery-no problems now  . Nodule of neck 08-21-13    Chemotherapy to start this week for this(10-11-14 was told a yr ago- couldn't feel anything after chemo tx.)  . Portacath in place 08-21-13    left chest remains as of 10-11-14  . COPD (chronic obstructive pulmonary disease) (HCC)     mild  . Transfusion history     during chemotherapy, none recent  . History of hiatal hernia   . Cancer [facility])     '10-Ovarian cancer(tx. surgery, chemotherapy) -residual lymph edema lt. leg,restart of chemotherapy this week-Dr. Verne Spurr)  . Squamous cell skin cancer     left leg- one area recently excised, anther area is planned   Past Surgical History  Procedure Laterality Date  . Abdominal hysterectomy      '79-no cancer  . Laparotomy for staging / restaging      '10- [facility]  (Dr.Nycum)-Dr. Pippitt, oncology- surgery Ovarian Cancer  . Appendectomy      '10  with Ovarian staging surgery  . Cataract extraction, bilateral Bilateral   . Tubal ligation    . Fracture surgery Right     '12 -[facility] Rt. shoulder  . Portacath placement      has left chest pac  . Angioplasty  2009  . Foot surgery      tendon many yrs ago  . Cardiac catheterization    . Colonoscopy with propofol N/A 10/18/2014    Procedure: COLONOSCOPY WITH PROPOFOL;  Surgeon: Garlan Fair, MD;  Location: [facility];  Service: Endoscopy;  Laterality: N/A;   No family history on file. Social History  Substance Use Topics  . Smoking status: Former Smoker -- 1.50 packs/day for 13 years    Types: Cigarettes    Quit date: 03/10/1986  . Smokeless tobacco: Never Used  . Alcohol Use: No   OB History    No data available     Review of Systems  Constitutional: Negative for fever, activity change and fatigue.  HENT: Positive for congestion, postnasal drip and rhinorrhea. Negative for ear pain and sore throat.   Respiratory: Positive for cough. Negative for shortness of breath and wheezing.   Cardiovascular: Negative for chest pain.  Gastrointestinal: Negative.   All other systems reviewed and are negative.   Allergies  Carboplatin; Phenergan; Morphine and related; Clindamycin/lincomycin;  Penicillins; Prochlorperazine; and Sulfa antibiotics  Home Medications   Prior to Admission medications   Medication Sig Start Date End Date Taking? Authorizing Provider  aspirin 81 MG tablet Take 81 mg by mouth daily.   Yes Historical Provider, MD  BEE POLLEN PO Take 2 capsules by mouth daily.    Historical Provider, MD  buPROPion (WELLBUTRIN XL) 300 MG 24 hr tablet Take 300 mg by mouth every morning.    Historical Provider, MD  DULoxetine (CYMBALTA) 60 MG capsule Take 60 mg by mouth at bedtime.    Historical Provider, MD  ibuprofen (ADVIL,MOTRIN) 200 MG tablet Take 400 mg by mouth every 6 (six) hours as  needed for moderate pain.    Historical Provider, MD  LORazepam (ATIVAN) 1 MG tablet Take 1 mg by mouth 2 (two) times daily as needed for anxiety.    Historical Provider, MD  Multiple Vitamin (MULTIVITAMIN WITH MINERALS) TABS tablet Take 1 tablet by mouth daily.    Historical Provider, MD  pantoprazole (PROTONIX) 40 MG tablet Take 40 mg by mouth 2 (two) times daily.    Historical Provider, MD  simvastatin (ZOCOR) 20 MG tablet Take 20 mg by mouth every evening.    Historical Provider, MD  zolpidem (AMBIEN) 10 MG tablet Take 10 mg by mouth at bedtime as needed for sleep.    Historical Provider, MD   Meds Ordered and Administered this Visit  Medications - No data to display  BP 147/73 mmHg  Pulse 83  Temp(Src) 97.8 F (36.6 C) (Oral)  Resp 16  SpO2 98% No data found.   Physical Exam  Constitutional: She is oriented to person, place, and time. She appears well-developed and well-nourished. No distress.  HENT:  Mouth/Throat: No oropharyngeal exudate.  Bilateral TMs are normal Oropharynx with minor erythema, light cobblestoning and clear PND.  Eyes: EOM are normal. Left eye exhibits no discharge.  Neck: Normal range of motion. Neck supple.  Cardiovascular: Normal rate, regular rhythm and normal heart sounds.   Pulmonary/Chest: Effort normal and breath sounds normal. No respiratory distress. She has no wheezes. She has no rales.  Musculoskeletal: Normal range of motion. She exhibits no edema.  Lymphadenopathy:    She has no cervical adenopathy.  Neurological: She is alert and oriented to person, place, and time.  Skin: Skin is warm and dry. No rash noted.  Psychiatric: She has a normal mood and affect.  Nursing note and vitals reviewed.   ED Course  Procedures (including critical care time)  Labs Review Labs Reviewed  POCT RAPID STREP A   Results for orders placed or performed during the hospital encounter of 05/13/15  POCT rapid strep A [facility])  Result Value Ref  Range   Streptococcus, Group A Screen (Direct) NEGATIVE NEGATIVE     Imaging Review No results found.   Visual Acuity Review  Right Eye Distance:   Left Eye Distance:   Bilateral Distance:    Right Eye Near:   Left Eye Near:    Bilateral Near:         MDM   1. URI (upper respiratory infection)    He primarily have sinus drainage which is causing her cough. The best way to treat this is with plenty of fluids, any histamines such as Allegra, Zyrtec or Claritin. You may take Mucinex to thin the secretions but this also may increase drainage as well. Tylenol as needed for discomfort. Strep test is negative. He will be retested.  Janne Napoleon, NP 05/13/15 1500

## 2015-05-13 NOTE — ED Notes (Signed)
Patient and special needs family member are in same treatment room and same provider

## 2015-05-13 NOTE — Discharge Instructions (Signed)
Upper Respiratory Infection, Adult He primarily have sinus drainage which is causing her cough. The best way to treat this is with plenty of fluids, any histamines such as Allegra, Zyrtec or Claritin. You may take Mucinex to thin the secretions but this also may increase drainage as well. Tylenol as needed for discomfort. Strep test is negative. He will be retested. Most upper respiratory infections (URIs) are a viral infection of the air passages leading to the lungs. A URI affects the nose, throat, and upper air passages. The most common type of URI is nasopharyngitis and is typically referred to as "the common cold." URIs run their course and usually go away on their own. Most of the time, a URI does not require medical attention, but sometimes a bacterial infection in the upper airways can follow a viral infection. This is called a secondary infection. Sinus and middle ear infections are common types of secondary upper respiratory infections. Bacterial pneumonia can also complicate a URI. A URI can worsen asthma and chronic obstructive pulmonary disease (COPD). Sometimes, these complications can require emergency medical care and may be life threatening.  CAUSES Almost all URIs are caused by viruses. A virus is a type of germ and can spread from one person to another.  RISKS FACTORS You may be at risk for a URI if:   You smoke.   You have chronic heart or lung disease.  You have a weakened defense (immune) system.   You are very young or very old.   You have nasal allergies or asthma.  You work in crowded or poorly ventilated areas.  You work in health care facilities or schools. SIGNS AND SYMPTOMS  Symptoms typically develop 2-3 days after you come in contact with a cold virus. Most viral URIs last 7-10 days. However, viral URIs from the influenza virus (flu virus) can last 14-18 days and are typically more severe. Symptoms may include:   Runny or stuffy (congested) nose.    Sneezing.   Cough.   Sore throat.   Headache.   Fatigue.   Fever.   Loss of appetite.   Pain in your forehead, behind your eyes, and over your cheekbones (sinus pain).  Muscle aches.  DIAGNOSIS  Your health care provider may diagnose a URI by:  Physical exam.  Tests to check that your symptoms are not due to another condition such as:  Strep throat.  Sinusitis.  Pneumonia.  Asthma. TREATMENT  A URI goes away on its own with time. It cannot be cured with medicines, but medicines may be prescribed or recommended to relieve symptoms. Medicines may help:  Reduce your fever.  Reduce your cough.  Relieve nasal congestion. HOME CARE INSTRUCTIONS   Take medicines only as directed by your health care provider.   Gargle warm saltwater or take cough drops to comfort your throat as directed by your health care provider.  Use a warm mist humidifier or inhale steam from a shower to increase air moisture. This may make it easier to breathe.  Drink enough fluid to keep your urine clear or pale yellow.   Eat soups and other clear broths and maintain good nutrition.   Rest as needed.   Return to work when your temperature has returned to normal or as your health care provider advises. You may need to stay home longer to avoid infecting others. You can also use a face mask and careful hand washing to prevent spread of the virus.  Increase the usage of your  inhaler if you have asthma.   Do not use any tobacco products, including cigarettes, chewing tobacco, or electronic cigarettes. If you need help quitting, ask your health care provider. PREVENTION  The best way to protect yourself from getting a cold is to practice good hygiene.   Avoid oral or hand contact with people with cold symptoms.   Wash your hands often if contact occurs.  There is no clear evidence that vitamin C, vitamin E, echinacea, or exercise reduces the chance of developing a cold.  However, it is always recommended to get plenty of rest, exercise, and practice good nutrition.  SEEK MEDICAL CARE IF:   You are getting worse rather than better.   Your symptoms are not controlled by medicine.   You have chills.  You have worsening shortness of breath.  You have brown or red mucus.  You have yellow or brown nasal discharge.  You have pain in your face, especially when you bend forward.  You have a fever.  You have swollen neck glands.  You have pain while swallowing.  You have white areas in the back of your throat. SEEK IMMEDIATE MEDICAL CARE IF:   You have severe or persistent:  Headache.  Ear pain.  Sinus pain.  Chest pain.  You have chronic lung disease and any of the following:  Wheezing.  Prolonged cough.  Coughing up blood.  A change in your usual mucus.  You have a stiff neck.  You have changes in your:  Vision.  Hearing.  Thinking.  Mood. MAKE SURE YOU:   Understand these instructions.  Will watch your condition.  Will get help right away if you are not doing well or get worse.   This information is not intended to replace advice given to you by your health care provider. Make sure you discuss any questions you have with your health care provider.   Document Released: 10/21/2000 Document Revised: 09/11/2014 Document Reviewed: 08/02/2013 Elsevier Interactive Patient Education [employer].

## 2015-05-13 NOTE — ED Notes (Signed)
Complains of sore throat.  Complains of chest congestion and ears itching.  Patient reports symptoms about 3 weeks ago, reports symptoms went away.  Over the holidays symptoms returned.  Family member with similar symptoms.

## 2015-05-15 LAB — CULTURE, GROUP A STREP: Strep A Culture: NEGATIVE

## 2015-06-14 DIAGNOSIS — C44729 Squamous cell carcinoma of skin of left lower limb, including hip: Secondary | ICD-10-CM | POA: Diagnosis not present

## 2015-07-01 DIAGNOSIS — Z23 Encounter for immunization: Secondary | ICD-10-CM | POA: Diagnosis not present

## 2015-07-01 DIAGNOSIS — G479 Sleep disorder, unspecified: Secondary | ICD-10-CM | POA: Diagnosis not present

## 2015-07-01 DIAGNOSIS — F329 Major depressive disorder, single episode, unspecified: Secondary | ICD-10-CM | POA: Diagnosis not present

## 2015-07-01 DIAGNOSIS — N182 Chronic kidney disease, stage 2 (mild): Secondary | ICD-10-CM | POA: Diagnosis not present

## 2015-07-01 DIAGNOSIS — E782 Mixed hyperlipidemia: Secondary | ICD-10-CM | POA: Diagnosis not present

## 2015-07-02 DIAGNOSIS — C569 Malignant neoplasm of unspecified ovary: Secondary | ICD-10-CM | POA: Diagnosis not present

## 2015-07-12 DIAGNOSIS — R51 Headache: Secondary | ICD-10-CM | POA: Diagnosis not present

## 2015-07-12 DIAGNOSIS — J069 Acute upper respiratory infection, unspecified: Secondary | ICD-10-CM | POA: Diagnosis not present

## 2015-07-16 DIAGNOSIS — F419 Anxiety disorder, unspecified: Secondary | ICD-10-CM | POA: Diagnosis not present

## 2015-07-16 DIAGNOSIS — C778 Secondary and unspecified malignant neoplasm of lymph nodes of multiple regions: Secondary | ICD-10-CM | POA: Diagnosis not present

## 2015-07-16 DIAGNOSIS — C569 Malignant neoplasm of unspecified ovary: Secondary | ICD-10-CM | POA: Diagnosis not present

## 2015-07-16 DIAGNOSIS — Z7982 Long term (current) use of aspirin: Secondary | ICD-10-CM | POA: Diagnosis not present

## 2015-07-16 DIAGNOSIS — Z79899 Other long term (current) drug therapy: Secondary | ICD-10-CM | POA: Diagnosis not present

## 2015-07-16 DIAGNOSIS — I890 Lymphedema, not elsewhere classified: Secondary | ICD-10-CM | POA: Diagnosis not present

## 2015-07-16 DIAGNOSIS — Z85828 Personal history of other malignant neoplasm of skin: Secondary | ICD-10-CM | POA: Diagnosis not present

## 2015-07-16 DIAGNOSIS — E785 Hyperlipidemia, unspecified: Secondary | ICD-10-CM | POA: Diagnosis not present

## 2015-07-23 DIAGNOSIS — C561 Malignant neoplasm of right ovary: Secondary | ICD-10-CM | POA: Diagnosis not present

## 2015-07-23 DIAGNOSIS — K6289 Other specified diseases of anus and rectum: Secondary | ICD-10-CM | POA: Diagnosis not present

## 2015-07-23 DIAGNOSIS — R160 Hepatomegaly, not elsewhere classified: Secondary | ICD-10-CM | POA: Diagnosis not present

## 2015-07-23 DIAGNOSIS — K7689 Other specified diseases of liver: Secondary | ICD-10-CM | POA: Diagnosis not present

## 2015-07-23 DIAGNOSIS — R918 Other nonspecific abnormal finding of lung field: Secondary | ICD-10-CM | POA: Diagnosis not present

## 2015-07-23 DIAGNOSIS — R911 Solitary pulmonary nodule: Secondary | ICD-10-CM | POA: Diagnosis not present

## 2015-07-24 DIAGNOSIS — R938 Abnormal findings on diagnostic imaging of other specified body structures: Secondary | ICD-10-CM | POA: Diagnosis not present

## 2015-07-24 DIAGNOSIS — C778 Secondary and unspecified malignant neoplasm of lymph nodes of multiple regions: Secondary | ICD-10-CM | POA: Diagnosis not present

## 2015-07-24 DIAGNOSIS — Z90722 Acquired absence of ovaries, bilateral: Secondary | ICD-10-CM | POA: Diagnosis not present

## 2015-07-24 DIAGNOSIS — F419 Anxiety disorder, unspecified: Secondary | ICD-10-CM | POA: Diagnosis not present

## 2015-07-24 DIAGNOSIS — Z08 Encounter for follow-up examination after completed treatment for malignant neoplasm: Secondary | ICD-10-CM | POA: Diagnosis not present

## 2015-07-24 DIAGNOSIS — C569 Malignant neoplasm of unspecified ovary: Secondary | ICD-10-CM | POA: Diagnosis not present

## 2015-07-24 DIAGNOSIS — E785 Hyperlipidemia, unspecified: Secondary | ICD-10-CM | POA: Diagnosis not present

## 2015-07-24 DIAGNOSIS — I890 Lymphedema, not elsewhere classified: Secondary | ICD-10-CM | POA: Diagnosis not present

## 2015-07-24 DIAGNOSIS — Z8543 Personal history of malignant neoplasm of ovary: Secondary | ICD-10-CM | POA: Diagnosis not present

## 2015-07-24 DIAGNOSIS — R971 Elevated cancer antigen 125 [CA 125]: Secondary | ICD-10-CM | POA: Diagnosis not present

## 2015-07-24 DIAGNOSIS — Z90710 Acquired absence of both cervix and uterus: Secondary | ICD-10-CM | POA: Diagnosis not present

## 2015-07-24 DIAGNOSIS — Z85828 Personal history of other malignant neoplasm of skin: Secondary | ICD-10-CM | POA: Diagnosis not present

## 2015-08-27 DIAGNOSIS — H903 Sensorineural hearing loss, bilateral: Secondary | ICD-10-CM | POA: Diagnosis not present

## 2015-09-02 DIAGNOSIS — R109 Unspecified abdominal pain: Secondary | ICD-10-CM | POA: Diagnosis not present

## 2015-09-02 DIAGNOSIS — Z9079 Acquired absence of other genital organ(s): Secondary | ICD-10-CM | POA: Diagnosis not present

## 2015-09-02 DIAGNOSIS — C569 Malignant neoplasm of unspecified ovary: Secondary | ICD-10-CM | POA: Diagnosis not present

## 2015-09-02 DIAGNOSIS — Z90710 Acquired absence of both cervix and uterus: Secondary | ICD-10-CM | POA: Diagnosis not present

## 2015-09-02 DIAGNOSIS — Z90722 Acquired absence of ovaries, bilateral: Secondary | ICD-10-CM | POA: Diagnosis not present

## 2015-09-18 DIAGNOSIS — C569 Malignant neoplasm of unspecified ovary: Secondary | ICD-10-CM | POA: Diagnosis not present

## 2015-09-18 DIAGNOSIS — Z85828 Personal history of other malignant neoplasm of skin: Secondary | ICD-10-CM | POA: Diagnosis not present

## 2015-09-24 DIAGNOSIS — C778 Secondary and unspecified malignant neoplasm of lymph nodes of multiple regions: Secondary | ICD-10-CM | POA: Diagnosis not present

## 2015-09-24 DIAGNOSIS — Z880 Allergy status to penicillin: Secondary | ICD-10-CM | POA: Diagnosis not present

## 2015-09-24 DIAGNOSIS — C569 Malignant neoplasm of unspecified ovary: Secondary | ICD-10-CM | POA: Diagnosis not present

## 2015-09-24 DIAGNOSIS — I341 Nonrheumatic mitral (valve) prolapse: Secondary | ICD-10-CM | POA: Diagnosis not present

## 2015-09-24 DIAGNOSIS — E785 Hyperlipidemia, unspecified: Secondary | ICD-10-CM | POA: Diagnosis not present

## 2015-09-24 DIAGNOSIS — Z885 Allergy status to narcotic agent status: Secondary | ICD-10-CM | POA: Diagnosis not present

## 2015-09-24 DIAGNOSIS — Z888 Allergy status to other drugs, medicaments and biological substances status: Secondary | ICD-10-CM | POA: Diagnosis not present

## 2015-09-24 DIAGNOSIS — Z7982 Long term (current) use of aspirin: Secondary | ICD-10-CM | POA: Diagnosis not present

## 2015-09-24 DIAGNOSIS — Z791 Long term (current) use of non-steroidal anti-inflammatories (NSAID): Secondary | ICD-10-CM | POA: Diagnosis not present

## 2015-09-24 DIAGNOSIS — Z882 Allergy status to sulfonamides status: Secondary | ICD-10-CM | POA: Diagnosis not present

## 2015-09-24 DIAGNOSIS — C786 Secondary malignant neoplasm of retroperitoneum and peritoneum: Secondary | ICD-10-CM | POA: Diagnosis not present

## 2015-09-25 DIAGNOSIS — I341 Nonrheumatic mitral (valve) prolapse: Secondary | ICD-10-CM | POA: Diagnosis not present

## 2015-09-25 DIAGNOSIS — C778 Secondary and unspecified malignant neoplasm of lymph nodes of multiple regions: Secondary | ICD-10-CM | POA: Diagnosis not present

## 2015-09-25 DIAGNOSIS — C569 Malignant neoplasm of unspecified ovary: Secondary | ICD-10-CM | POA: Diagnosis not present

## 2015-09-25 DIAGNOSIS — C786 Secondary malignant neoplasm of retroperitoneum and peritoneum: Secondary | ICD-10-CM | POA: Diagnosis not present

## 2015-09-25 DIAGNOSIS — E785 Hyperlipidemia, unspecified: Secondary | ICD-10-CM | POA: Diagnosis not present

## 2015-09-25 DIAGNOSIS — Z7982 Long term (current) use of aspirin: Secondary | ICD-10-CM | POA: Diagnosis not present

## 2015-10-08 DIAGNOSIS — Z85828 Personal history of other malignant neoplasm of skin: Secondary | ICD-10-CM | POA: Diagnosis not present

## 2015-10-08 DIAGNOSIS — C569 Malignant neoplasm of unspecified ovary: Secondary | ICD-10-CM | POA: Diagnosis not present

## 2015-10-15 DIAGNOSIS — C569 Malignant neoplasm of unspecified ovary: Secondary | ICD-10-CM | POA: Diagnosis not present

## 2015-10-15 DIAGNOSIS — I890 Lymphedema, not elsewhere classified: Secondary | ICD-10-CM | POA: Diagnosis not present

## 2015-10-15 DIAGNOSIS — E785 Hyperlipidemia, unspecified: Secondary | ICD-10-CM | POA: Diagnosis not present

## 2015-10-15 DIAGNOSIS — I341 Nonrheumatic mitral (valve) prolapse: Secondary | ICD-10-CM | POA: Diagnosis not present

## 2015-10-15 DIAGNOSIS — F419 Anxiety disorder, unspecified: Secondary | ICD-10-CM | POA: Diagnosis not present

## 2015-10-15 DIAGNOSIS — Z5111 Encounter for antineoplastic chemotherapy: Secondary | ICD-10-CM | POA: Diagnosis not present

## 2015-10-16 DIAGNOSIS — I341 Nonrheumatic mitral (valve) prolapse: Secondary | ICD-10-CM | POA: Diagnosis not present

## 2015-10-16 DIAGNOSIS — E785 Hyperlipidemia, unspecified: Secondary | ICD-10-CM | POA: Diagnosis not present

## 2015-10-16 DIAGNOSIS — F419 Anxiety disorder, unspecified: Secondary | ICD-10-CM | POA: Diagnosis not present

## 2015-10-16 DIAGNOSIS — C569 Malignant neoplasm of unspecified ovary: Secondary | ICD-10-CM | POA: Diagnosis not present

## 2015-10-16 DIAGNOSIS — I890 Lymphedema, not elsewhere classified: Secondary | ICD-10-CM | POA: Diagnosis not present

## 2015-10-16 DIAGNOSIS — Z5111 Encounter for antineoplastic chemotherapy: Secondary | ICD-10-CM | POA: Diagnosis not present

## 2015-10-25 DIAGNOSIS — M5136 Other intervertebral disc degeneration, lumbar region: Secondary | ICD-10-CM | POA: Diagnosis not present

## 2015-10-29 DIAGNOSIS — C569 Malignant neoplasm of unspecified ovary: Secondary | ICD-10-CM | POA: Diagnosis not present

## 2015-10-31 DIAGNOSIS — K5900 Constipation, unspecified: Secondary | ICD-10-CM | POA: Diagnosis not present

## 2015-10-31 DIAGNOSIS — C569 Malignant neoplasm of unspecified ovary: Secondary | ICD-10-CM | POA: Diagnosis not present

## 2015-10-31 DIAGNOSIS — M549 Dorsalgia, unspecified: Secondary | ICD-10-CM | POA: Diagnosis not present

## 2015-11-02 DIAGNOSIS — S32020D Wedge compression fracture of second lumbar vertebra, subsequent encounter for fracture with routine healing: Secondary | ICD-10-CM | POA: Diagnosis not present

## 2015-11-07 DIAGNOSIS — Z01812 Encounter for preprocedural laboratory examination: Secondary | ICD-10-CM | POA: Diagnosis not present

## 2015-11-11 DIAGNOSIS — Z9079 Acquired absence of other genital organ(s): Secondary | ICD-10-CM | POA: Diagnosis not present

## 2015-11-11 DIAGNOSIS — M549 Dorsalgia, unspecified: Secondary | ICD-10-CM | POA: Diagnosis not present

## 2015-11-11 DIAGNOSIS — R5383 Other fatigue: Secondary | ICD-10-CM | POA: Diagnosis not present

## 2015-11-11 DIAGNOSIS — R110 Nausea: Secondary | ICD-10-CM | POA: Diagnosis not present

## 2015-11-11 DIAGNOSIS — C482 Malignant neoplasm of peritoneum, unspecified: Secondary | ICD-10-CM | POA: Diagnosis not present

## 2015-11-11 DIAGNOSIS — Z90710 Acquired absence of both cervix and uterus: Secondary | ICD-10-CM | POA: Diagnosis not present

## 2015-11-11 DIAGNOSIS — C799 Secondary malignant neoplasm of unspecified site: Secondary | ICD-10-CM | POA: Diagnosis not present

## 2015-11-11 DIAGNOSIS — T451X5A Adverse effect of antineoplastic and immunosuppressive drugs, initial encounter: Secondary | ICD-10-CM | POA: Diagnosis not present

## 2015-11-11 DIAGNOSIS — C569 Malignant neoplasm of unspecified ovary: Secondary | ICD-10-CM | POA: Diagnosis not present

## 2015-11-11 DIAGNOSIS — Z90722 Acquired absence of ovaries, bilateral: Secondary | ICD-10-CM | POA: Diagnosis not present

## 2015-11-11 DIAGNOSIS — K5900 Constipation, unspecified: Secondary | ICD-10-CM | POA: Diagnosis not present

## 2015-11-11 DIAGNOSIS — Z85828 Personal history of other malignant neoplasm of skin: Secondary | ICD-10-CM | POA: Diagnosis not present

## 2015-11-13 DIAGNOSIS — M549 Dorsalgia, unspecified: Secondary | ICD-10-CM | POA: Diagnosis not present

## 2015-11-13 DIAGNOSIS — R4182 Altered mental status, unspecified: Secondary | ICD-10-CM | POA: Diagnosis not present

## 2015-11-13 DIAGNOSIS — Z79899 Other long term (current) drug therapy: Secondary | ICD-10-CM | POA: Diagnosis not present

## 2015-11-13 DIAGNOSIS — E785 Hyperlipidemia, unspecified: Secondary | ICD-10-CM | POA: Diagnosis not present

## 2015-11-13 DIAGNOSIS — Z634 Disappearance and death of family member: Secondary | ICD-10-CM | POA: Diagnosis not present

## 2015-11-13 DIAGNOSIS — Z5309 Procedure and treatment not carried out because of other contraindication: Secondary | ICD-10-CM | POA: Diagnosis not present

## 2015-11-13 DIAGNOSIS — I890 Lymphedema, not elsewhere classified: Secondary | ICD-10-CM | POA: Diagnosis not present

## 2015-11-13 DIAGNOSIS — R739 Hyperglycemia, unspecified: Secondary | ICD-10-CM | POA: Diagnosis not present

## 2015-11-13 DIAGNOSIS — N179 Acute kidney failure, unspecified: Secondary | ICD-10-CM | POA: Diagnosis not present

## 2015-11-13 DIAGNOSIS — E860 Dehydration: Secondary | ICD-10-CM | POA: Diagnosis not present

## 2015-11-13 DIAGNOSIS — C562 Malignant neoplasm of left ovary: Secondary | ICD-10-CM | POA: Diagnosis not present

## 2015-11-13 DIAGNOSIS — F419 Anxiety disorder, unspecified: Secondary | ICD-10-CM | POA: Diagnosis not present

## 2015-11-13 DIAGNOSIS — C775 Secondary and unspecified malignant neoplasm of intrapelvic lymph nodes: Secondary | ICD-10-CM | POA: Diagnosis not present

## 2015-11-13 DIAGNOSIS — C569 Malignant neoplasm of unspecified ovary: Secondary | ICD-10-CM | POA: Diagnosis not present

## 2015-11-13 DIAGNOSIS — D320 Benign neoplasm of cerebral meninges: Secondary | ICD-10-CM | POA: Diagnosis not present

## 2015-11-14 DIAGNOSIS — I639 Cerebral infarction, unspecified: Secondary | ICD-10-CM | POA: Diagnosis not present

## 2015-11-14 DIAGNOSIS — R4182 Altered mental status, unspecified: Secondary | ICD-10-CM | POA: Diagnosis not present

## 2015-11-14 DIAGNOSIS — R410 Disorientation, unspecified: Secondary | ICD-10-CM | POA: Diagnosis not present

## 2015-11-14 DIAGNOSIS — D320 Benign neoplasm of cerebral meninges: Secondary | ICD-10-CM | POA: Diagnosis not present

## 2015-11-14 DIAGNOSIS — N179 Acute kidney failure, unspecified: Secondary | ICD-10-CM | POA: Diagnosis not present

## 2015-11-14 DIAGNOSIS — E785 Hyperlipidemia, unspecified: Secondary | ICD-10-CM | POA: Diagnosis not present

## 2015-11-14 DIAGNOSIS — J9811 Atelectasis: Secondary | ICD-10-CM | POA: Diagnosis not present

## 2015-11-14 DIAGNOSIS — C775 Secondary and unspecified malignant neoplasm of intrapelvic lymph nodes: Secondary | ICD-10-CM | POA: Diagnosis not present

## 2015-11-14 DIAGNOSIS — M549 Dorsalgia, unspecified: Secondary | ICD-10-CM | POA: Diagnosis not present

## 2015-11-14 DIAGNOSIS — C569 Malignant neoplasm of unspecified ovary: Secondary | ICD-10-CM | POA: Diagnosis not present

## 2015-11-15 DIAGNOSIS — D320 Benign neoplasm of cerebral meninges: Secondary | ICD-10-CM | POA: Diagnosis not present

## 2015-11-15 DIAGNOSIS — N179 Acute kidney failure, unspecified: Secondary | ICD-10-CM | POA: Diagnosis not present

## 2015-11-15 DIAGNOSIS — R4182 Altered mental status, unspecified: Secondary | ICD-10-CM | POA: Diagnosis not present

## 2015-11-15 DIAGNOSIS — E785 Hyperlipidemia, unspecified: Secondary | ICD-10-CM | POA: Diagnosis not present

## 2015-11-15 DIAGNOSIS — M549 Dorsalgia, unspecified: Secondary | ICD-10-CM | POA: Diagnosis not present

## 2015-11-15 DIAGNOSIS — D329 Benign neoplasm of meninges, unspecified: Secondary | ICD-10-CM | POA: Diagnosis not present

## 2015-11-15 DIAGNOSIS — F332 Major depressive disorder, recurrent severe without psychotic features: Secondary | ICD-10-CM | POA: Diagnosis not present

## 2015-11-15 DIAGNOSIS — R410 Disorientation, unspecified: Secondary | ICD-10-CM | POA: Diagnosis not present

## 2015-11-15 DIAGNOSIS — C569 Malignant neoplasm of unspecified ovary: Secondary | ICD-10-CM | POA: Diagnosis not present

## 2015-11-15 DIAGNOSIS — C775 Secondary and unspecified malignant neoplasm of intrapelvic lymph nodes: Secondary | ICD-10-CM | POA: Diagnosis not present

## 2015-11-16 DIAGNOSIS — E785 Hyperlipidemia, unspecified: Secondary | ICD-10-CM | POA: Diagnosis not present

## 2015-11-16 DIAGNOSIS — R4182 Altered mental status, unspecified: Secondary | ICD-10-CM | POA: Diagnosis not present

## 2015-11-16 DIAGNOSIS — C775 Secondary and unspecified malignant neoplasm of intrapelvic lymph nodes: Secondary | ICD-10-CM | POA: Diagnosis not present

## 2015-11-16 DIAGNOSIS — C569 Malignant neoplasm of unspecified ovary: Secondary | ICD-10-CM | POA: Diagnosis not present

## 2015-11-16 DIAGNOSIS — M549 Dorsalgia, unspecified: Secondary | ICD-10-CM | POA: Diagnosis not present

## 2015-11-16 DIAGNOSIS — D320 Benign neoplasm of cerebral meninges: Secondary | ICD-10-CM | POA: Diagnosis not present

## 2015-11-19 ENCOUNTER — Ambulatory Visit
Admission: RE | Admit: 2015-11-19 | Discharge: 2015-11-19 | Disposition: A | Discharge status: Home / Self Care | Payer: Medicare Other | Source: Ambulatory Visit | Attending: Physical Medicine and Rehabilitation | Admitting: Physical Medicine and Rehabilitation

## 2015-11-19 ENCOUNTER — Other Ambulatory Visit: Payer: Self-pay | Admitting: Physical Medicine and Rehabilitation

## 2015-11-19 DIAGNOSIS — S32000D Wedge compression fracture of unspecified lumbar vertebra, subsequent encounter for fracture with routine healing: Secondary | ICD-10-CM

## 2015-11-19 DIAGNOSIS — S32020A Wedge compression fracture of second lumbar vertebra, initial encounter for closed fracture: Secondary | ICD-10-CM | POA: Diagnosis not present

## 2015-11-19 MED ORDER — GADOBENATE DIMEGLUMINE 529 MG/ML IV SOLN
15.0000 mL | Freq: Once | INTRAVENOUS | Status: AC | PRN
  Administered 2015-11-19: 15 mL via INTRAVENOUS

## 2015-11-20 ENCOUNTER — Other Ambulatory Visit: Payer: Self-pay | Admitting: Physical Medicine and Rehabilitation

## 2015-11-20 DIAGNOSIS — S32000D Wedge compression fracture of unspecified lumbar vertebra, subsequent encounter for fracture with routine healing: Secondary | ICD-10-CM

## 2015-11-20 DIAGNOSIS — S32020D Wedge compression fracture of second lumbar vertebra, subsequent encounter for fracture with routine healing: Secondary | ICD-10-CM | POA: Diagnosis not present

## 2015-11-22 DIAGNOSIS — S32000D Wedge compression fracture of unspecified lumbar vertebra, subsequent encounter for fracture with routine healing: Secondary | ICD-10-CM | POA: Diagnosis not present

## 2015-11-25 DIAGNOSIS — Z90710 Acquired absence of both cervix and uterus: Secondary | ICD-10-CM | POA: Diagnosis not present

## 2015-11-25 DIAGNOSIS — Z85828 Personal history of other malignant neoplasm of skin: Secondary | ICD-10-CM | POA: Diagnosis not present

## 2015-11-25 DIAGNOSIS — Z9079 Acquired absence of other genital organ(s): Secondary | ICD-10-CM | POA: Diagnosis not present

## 2015-11-25 DIAGNOSIS — M549 Dorsalgia, unspecified: Secondary | ICD-10-CM | POA: Diagnosis not present

## 2015-11-25 DIAGNOSIS — Z90722 Acquired absence of ovaries, bilateral: Secondary | ICD-10-CM | POA: Diagnosis not present

## 2015-11-25 DIAGNOSIS — C569 Malignant neoplasm of unspecified ovary: Secondary | ICD-10-CM | POA: Diagnosis not present

## 2015-11-25 DIAGNOSIS — C482 Malignant neoplasm of peritoneum, unspecified: Secondary | ICD-10-CM | POA: Diagnosis not present

## 2015-11-25 DIAGNOSIS — G939 Disorder of brain, unspecified: Secondary | ICD-10-CM | POA: Diagnosis not present

## 2015-11-25 DIAGNOSIS — C799 Secondary malignant neoplasm of unspecified site: Secondary | ICD-10-CM | POA: Diagnosis not present

## 2015-11-26 DIAGNOSIS — C569 Malignant neoplasm of unspecified ovary: Secondary | ICD-10-CM | POA: Diagnosis not present

## 2015-11-26 DIAGNOSIS — M1612 Unilateral primary osteoarthritis, left hip: Secondary | ICD-10-CM | POA: Diagnosis not present

## 2015-11-26 DIAGNOSIS — F324 Major depressive disorder, single episode, in partial remission: Secondary | ICD-10-CM | POA: Diagnosis not present

## 2015-11-26 DIAGNOSIS — S32000G Wedge compression fracture of unspecified lumbar vertebra, subsequent encounter for fracture with delayed healing: Secondary | ICD-10-CM | POA: Diagnosis not present

## 2015-11-28 ENCOUNTER — Ambulatory Visit: Payer: Self-pay | Admitting: Physician Assistant

## 2015-11-28 DIAGNOSIS — Z880 Allergy status to penicillin: Secondary | ICD-10-CM | POA: Diagnosis not present

## 2015-11-28 DIAGNOSIS — Z882 Allergy status to sulfonamides status: Secondary | ICD-10-CM | POA: Diagnosis not present

## 2015-11-28 DIAGNOSIS — R4182 Altered mental status, unspecified: Secondary | ICD-10-CM | POA: Diagnosis not present

## 2015-11-28 DIAGNOSIS — M549 Dorsalgia, unspecified: Secondary | ICD-10-CM | POA: Diagnosis not present

## 2015-11-28 DIAGNOSIS — C569 Malignant neoplasm of unspecified ovary: Secondary | ICD-10-CM | POA: Diagnosis not present

## 2015-11-28 DIAGNOSIS — Z883 Allergy status to other anti-infective agents status: Secondary | ICD-10-CM | POA: Diagnosis not present

## 2015-11-28 DIAGNOSIS — Z888 Allergy status to other drugs, medicaments and biological substances status: Secondary | ICD-10-CM | POA: Diagnosis not present

## 2015-11-28 DIAGNOSIS — E785 Hyperlipidemia, unspecified: Secondary | ICD-10-CM | POA: Diagnosis not present

## 2015-11-28 DIAGNOSIS — Z79899 Other long term (current) drug therapy: Secondary | ICD-10-CM | POA: Diagnosis not present

## 2015-11-28 DIAGNOSIS — Z85828 Personal history of other malignant neoplasm of skin: Secondary | ICD-10-CM | POA: Diagnosis not present

## 2015-11-28 DIAGNOSIS — F419 Anxiety disorder, unspecified: Secondary | ICD-10-CM | POA: Diagnosis not present

## 2015-11-28 DIAGNOSIS — Z7982 Long term (current) use of aspirin: Secondary | ICD-10-CM | POA: Diagnosis not present

## 2015-11-28 DIAGNOSIS — Z886 Allergy status to analgesic agent status: Secondary | ICD-10-CM | POA: Diagnosis not present

## 2015-11-29 DIAGNOSIS — F419 Anxiety disorder, unspecified: Secondary | ICD-10-CM | POA: Diagnosis not present

## 2015-11-29 DIAGNOSIS — C569 Malignant neoplasm of unspecified ovary: Secondary | ICD-10-CM | POA: Diagnosis not present

## 2015-11-29 DIAGNOSIS — G8929 Other chronic pain: Secondary | ICD-10-CM | POA: Diagnosis not present

## 2015-11-29 DIAGNOSIS — E785 Hyperlipidemia, unspecified: Secondary | ICD-10-CM | POA: Diagnosis not present

## 2015-11-29 DIAGNOSIS — M549 Dorsalgia, unspecified: Secondary | ICD-10-CM | POA: Diagnosis not present

## 2015-11-29 DIAGNOSIS — M25552 Pain in left hip: Secondary | ICD-10-CM | POA: Diagnosis not present

## 2015-11-29 DIAGNOSIS — Z85828 Personal history of other malignant neoplasm of skin: Secondary | ICD-10-CM | POA: Diagnosis not present

## 2015-11-29 DIAGNOSIS — R4182 Altered mental status, unspecified: Secondary | ICD-10-CM | POA: Diagnosis not present

## 2015-12-02 NOTE — Pre-Procedure Instructions (Signed)
[name]  12/02/2015      [facility] ([facility] [place] [id number] [place] ([place]) [place] [address] Phone: [phone] Fax: [phone]    Your procedure is scheduled on August 2  Report to [facility] at [place].M.  Call this number if you have problems the morning of surgery:  [phone]   Remember:  Do not eat food or drink liquids after midnight.   Take these medicines the morning of surgery with A SIP OF WATER: bupropion (wellbutrin), nasal spray, duloxetine (cymbalta), ativan if needed, pantoprazole (protonix)  7 days prior to surgery STOP taking any Aspirin, Aleve, Naproxen, Ibuprofen, Motrin, Advil, Goody's, BC's, all herbal medications, fish oil, and all vitamins     Do not wear jewelry, make-up or nail polish.  Do not wear lotions, powders, or perfumes.  You may NOT wear deoderant.  Do not shave 48 hours prior to surgery.  Men may shave face and neck.  Do not bring valuables to the hospital.  [facility] is not responsible for any belongings or valuables.  Contacts, dentures or bridgework may not be worn into surgery.  Leave your suitcase in the car.  After surgery it may be brought to your room.  For patients admitted to the hospital, discharge time will be determined by your treatment team.  Patients discharged the day of surgery will not be allowed to drive home.    Special instructions:   [facility]- Preparing For Surgery  Before surgery, you can play an important role. Because skin is not sterile, your skin needs to be as free of germs as possible. You can reduce the number of germs on your skin by washing with CHG (chlorahexidine gluconate) Soap before surgery.  CHG is an antiseptic cleaner which kills germs and bonds with the skin to continue killing germs even after washing.  Please do not use if you have an allergy to CHG or antibacterial soaps. If your skin becomes  reddened/irritated stop using the CHG.  Do not shave (including legs and underarms) for at least 48 hours prior to first CHG shower. It is OK to shave your face.  Please follow these instructions carefully.   1. Shower the NIGHT BEFORE SURGERY and the MORNING OF SURGERY with CHG.   2. If you chose to wash your hair, wash your hair first as usual with your normal shampoo.  3. After you shampoo, rinse your hair and body thoroughly to remove the shampoo.  4. Use CHG as you would any other liquid soap. You can apply CHG directly to the skin and wash gently with a scrungie or a clean washcloth.   5. Apply the CHG Soap to your body ONLY FROM THE NECK DOWN.  Do not use on open wounds or open sores. Avoid contact with your eyes, ears, mouth and genitals (private parts). Wash genitals (private parts) with your normal soap.  6. Wash thoroughly, paying special attention to the area where your surgery will be performed.  7. Thoroughly rinse your body with warm water from the neck down.  8. DO NOT shower/wash with your normal soap after using and rinsing off the CHG Soap.  9. Pat yourself dry with a CLEAN TOWEL.   10. Wear CLEAN PAJAMAS   11. Place CLEAN SHEETS on your bed the night of your first shower and DO NOT SLEEP WITH PETS.    Day of Surgery: Do not apply  any deodorants/lotions. Please wear clean clothes to the hospital/surgery center.      Please read over the following fact sheets that you were given. Pain Booklet and Surgical Site Infection Prevention

## 2015-12-03 ENCOUNTER — Encounter (HOSPITAL_COMMUNITY): Payer: Self-pay

## 2015-12-03 ENCOUNTER — Other Ambulatory Visit: Payer: Self-pay

## 2015-12-03 ENCOUNTER — Encounter (HOSPITAL_COMMUNITY)
Admission: RE | Admit: 2015-12-03 | Discharge: 2015-12-03 | Disposition: A | Discharge status: Home / Self Care | Payer: Medicare Other | Source: Ambulatory Visit | Attending: Orthopedic Surgery | Admitting: Orthopedic Surgery

## 2015-12-03 DIAGNOSIS — Z01818 Encounter for other preprocedural examination: Secondary | ICD-10-CM | POA: Diagnosis not present

## 2015-12-03 DIAGNOSIS — R000 Tachycardia, unspecified: Secondary | ICD-10-CM | POA: Insufficient documentation

## 2015-12-03 DIAGNOSIS — K219 Gastro-esophageal reflux disease without esophagitis: Secondary | ICD-10-CM | POA: Insufficient documentation

## 2015-12-03 DIAGNOSIS — Z01812 Encounter for preprocedural laboratory examination: Secondary | ICD-10-CM | POA: Diagnosis not present

## 2015-12-03 DIAGNOSIS — Z8543 Personal history of malignant neoplasm of ovary: Secondary | ICD-10-CM | POA: Diagnosis not present

## 2015-12-03 DIAGNOSIS — J449 Chronic obstructive pulmonary disease, unspecified: Secondary | ICD-10-CM | POA: Insufficient documentation

## 2015-12-03 DIAGNOSIS — S32020D Wedge compression fracture of second lumbar vertebra, subsequent encounter for fracture with routine healing: Secondary | ICD-10-CM | POA: Diagnosis not present

## 2015-12-03 DIAGNOSIS — Z87891 Personal history of nicotine dependence: Secondary | ICD-10-CM | POA: Diagnosis not present

## 2015-12-03 DIAGNOSIS — Z9221 Personal history of antineoplastic chemotherapy: Secondary | ICD-10-CM | POA: Diagnosis not present

## 2015-12-03 DIAGNOSIS — I341 Nonrheumatic mitral (valve) prolapse: Secondary | ICD-10-CM | POA: Diagnosis not present

## 2015-12-03 DIAGNOSIS — M25552 Pain in left hip: Secondary | ICD-10-CM | POA: Diagnosis not present

## 2015-12-03 DIAGNOSIS — I890 Lymphedema, not elsewhere classified: Secondary | ICD-10-CM | POA: Insufficient documentation

## 2015-12-03 DIAGNOSIS — Z85828 Personal history of other malignant neoplasm of skin: Secondary | ICD-10-CM | POA: Insufficient documentation

## 2015-12-03 HISTORY — DX: Reserved for inherently not codable concepts without codable children: IMO0001

## 2015-12-03 HISTORY — DX: Pneumonia, unspecified organism: J18.9

## 2015-12-03 HISTORY — DX: Cardiac murmur, unspecified: R01.1

## 2015-12-03 LAB — CBC
HEMATOCRIT: 38.3 % (ref 36.0–46.0)
HEMOGLOBIN: 12 g/dL (ref 12.0–15.0)
MCH: 27.8 pg (ref 26.0–34.0)
MCHC: 31.3 g/dL (ref 30.0–36.0)
MCV: 88.7 fL (ref 78.0–100.0)
Platelets: 360 10*3/uL (ref 150–400)
RBC: 4.32 MIL/uL (ref 3.87–5.11)
RDW: 18 % — AB (ref 11.5–15.5)
WBC: 6.5 10*3/uL (ref 4.0–10.5)

## 2015-12-03 LAB — SURGICAL PCR SCREEN
MRSA, PCR: NEGATIVE
STAPHYLOCOCCUS AUREUS: POSITIVE — AB

## 2015-12-03 LAB — BASIC METABOLIC PANEL
ANION GAP: 6 (ref 5–15)
BUN: 16 mg/dL (ref 6–20)
CALCIUM: 9.5 mg/dL (ref 8.9–10.3)
CO2: 29 mmol/L (ref 22–32)
Chloride: 103 mmol/L (ref 101–111)
Creatinine, Ser: 1.01 mg/dL — ABNORMAL HIGH (ref 0.44–1.00)
GFR calc Af Amer: >60 mL/min (ref >60)
GFR, EST NON AFRICAN AMERICAN: 56 mL/min — AB (ref >60)
Glucose, Bld: 117 mg/dL — ABNORMAL HIGH (ref 65–99)
POTASSIUM: 3.5 mmol/L (ref 3.5–5.1)
SODIUM: 138 mmol/L (ref 135–145)

## 2015-12-03 NOTE — Progress Notes (Addendum)
PCP is Dr Shirline Frees States she saw Dr. Fransico Him many years ago. She states she had a card cath and stress test 15 years or so ago. States she had an echo  In 2011 before she started Ca treatments. Today her heart rate is 118-113. No c/o chest pain. Ekg obtained.  States she is having low back pain going down her left hip and leg. Reports she had Ca treatment Fri 11-29-15, they had to stop due to she had a reaction. She also reports she had a reaction to the pre-meds she takes. She takes pre-meds 3 days before treatments (Wed- Fri) Dr. Genia Del

## 2015-12-03 NOTE — Progress Notes (Signed)
I called a prescription for Mupirocin ointment to [facility] , [place]

## 2015-12-05 ENCOUNTER — Encounter (HOSPITAL_COMMUNITY): Payer: Self-pay

## 2015-12-05 NOTE — Progress Notes (Signed)
Anesthesia Chart Review: Patient is a 70 year old female scheduled for L2 kyphoplasty on 12/11/2015 by Dr. Rolena Infante.  History includes former smoker, murmur/MVP, anxiety, depression, GERD, COPD, hiatal hernia, ovarian cancer s/p surgery (exploratory laparotomy, excision of pelvic mass, cancer staging and small bowel ileum resection with reanastomosis) and chemotherapy '10 with recurrence (undergoing chemotherapy), skin cancer, left leg lymphedema, SOB, hysterectomy, appendectomy, left Port-A-Cath  PCP is Dr. Shirline Frees. Seen on 11/26/15 for preoperative evaluation and medically cleared her for this procedure.    HEM-ONC is Dr. Genia Del with [facility] (see Care Everywhere). She was admitted by the oncology service on 11/28/15 for carboplatin desensitization under observation but had acute delirium which was similar to her last attempt at desensitization protocol. They did not feel that the benefits of continuing to attempt carboplatin desensitization outweighed the benefits. They were aware scheduled kyphoplasty on August 2 with plans to repeat a CT scan after her surgery.   She is not routinely followed by cardiology, but saw Dr. Fransico Him many years ago. Had a normal coronaries by cath in 2006 and an echo in 2012 before starting cancer treatment.   12/03/15 EKG: ST at 104 bpm, non-specific T wave abnormality.  07/11/10 Echo (done at [facility]): Conclusions: Normal LV size and function. There were no regional wall motion abnormalities. LVEF estimated 65-70%. Mildly thickened mitral valve. Mild mitral annular calcification. Trace mitral valve regurgitation. Trivial tricuspid regurgitation. The aortic valve is sclerotic but opens well. Trace aortic valve regurgitation. Analysis of mitral valve inflow, pulmonary vein Doppler and tissue Doppler suggests grade 1A diastolic dysfunction with elevated left atrial pressure.  03/26/05 Cardiac cath (Dr. Fransico Him): ASSESSMENT:  1.  Noncardiac  chest pain and shortness of breath.  2.  Normal left ventricular function with normal left ventricular end-      diastolic pressure.  3.  Normal coronary arteries.  4.  Biapical and left lower lobe lung scarring on chest x-ray.  5.  Dyslipidemia.  11/14/15 Carotid U/S ([facility]; Care Everywhere): IMPRESSION: 1.No hemodynamically significant stenosis on either side. 2.Both vertebral arteries are patent with antegrade flow.  11/14/15 1V CXR ([facility]; Care Everywhere): FINDINGS: Chronic interstitial changes. No focal pulmonary infiltrate, pleural effusion or pneumothorax. Mild bibasilar atelectasis. The cardiomediastinal silhouette is normal in size and contour. Left Port-A-Cath tip in the SVC.  11/14/15 MRI Brain ([facility]; Care Everywhere): IMPRESSION:  1. No acute intracranial abnormality. No evidence of acute infarct. No evidence of brain metastases. 2. A dural based enhancing lesion measuring up to 15 mm along the planum sphenoidale abutting the inferior margin of the medial frontal lobes without significant mass effect. This likely represents a meningioma. Given the patient's history of cancer, a  dural based metastatic lesion is not entirely excluded given the lack of comparison examinations. Recommend close followup examination in 2 to 3 months to assess interval change in size. 3. Moderate degree of nonspecific white matter signal changes within the bilateral cerebral hemispheres, possibly sequela of chronic small vessel ischemic change. 4. Arterial spin labeling demonstrates relative decreased perfusion to the left cerebral hemisphere, this may be artifactual, however suggest carotid ultrasound to evaluate for any potential vascular stenosis within the neck. (According to Dr. Shelba Flake 11/25/15 office note, "She did present for chemo on 11/13/15 but treatment was not administered due to acute altered mental status. She underwent a medical work up including neuro evaluation and MRI brain. She was  noted to have a meningeal based lesion no the brain favoring meningioma. Neurology felt  that the likelihood of this being a metastatic leison was extremely low and did not feel this was the source of her mental status changes. They have recommended repeat imaging in 2-3 months to ensure stability. Ultimately her delirium did resolve with hydration and time. It is felt that she became very confused after beginning to take dilaudid for her back pain and then subsequently confusing many of her other medications.")  Preoperative labs noted.   Patient was mildly tachycardic at PAT, but was in pain. Normal coronaries in 2006. No significant valvular disease by echo in 2012. She has medical clearance for this procedure. If no acute changes then I would anticipate that she could proceed as planned.  George Hugh [facility] Short Stay Center/Anesthesiology Phone [phone] 12/05/2015 5:42 PM

## 2015-12-06 ENCOUNTER — Encounter (HOSPITAL_COMMUNITY): Payer: Self-pay

## 2015-12-09 DIAGNOSIS — M8000XA Age-related osteoporosis with current pathological fracture, unspecified site, initial encounter for fracture: Secondary | ICD-10-CM | POA: Diagnosis not present

## 2015-12-11 ENCOUNTER — Ambulatory Visit (HOSPITAL_COMMUNITY): Payer: Medicare Other | Admitting: Vascular Surgery

## 2015-12-11 ENCOUNTER — Encounter (HOSPITAL_COMMUNITY): Payer: Self-pay | Admitting: *Deleted

## 2015-12-11 ENCOUNTER — Observation Stay (HOSPITAL_COMMUNITY)
Admission: RE | Admit: 2015-12-11 | Discharge: 2015-12-12 | Disposition: A | Discharge status: Home / Self Care | Payer: Medicare Other | Source: Ambulatory Visit | Attending: Orthopedic Surgery | Admitting: Orthopedic Surgery

## 2015-12-11 ENCOUNTER — Ambulatory Visit (HOSPITAL_COMMUNITY): Payer: Medicare Other | Admitting: Certified Registered"

## 2015-12-11 ENCOUNTER — Encounter (HOSPITAL_COMMUNITY)
Admission: RE | Disposition: A | Discharge status: Home / Self Care | Payer: Self-pay | Source: Ambulatory Visit | Attending: Orthopedic Surgery

## 2015-12-11 ENCOUNTER — Ambulatory Visit (HOSPITAL_COMMUNITY): Payer: Medicare Other

## 2015-12-11 DIAGNOSIS — S32020D Wedge compression fracture of second lumbar vertebra, subsequent encounter for fracture with routine healing: Secondary | ICD-10-CM | POA: Diagnosis not present

## 2015-12-11 DIAGNOSIS — M8008XA Age-related osteoporosis with current pathological fracture, vertebra(e), initial encounter for fracture: Secondary | ICD-10-CM | POA: Diagnosis not present

## 2015-12-11 DIAGNOSIS — W1849XA Other slipping, tripping and stumbling without falling, initial encounter: Secondary | ICD-10-CM | POA: Diagnosis not present

## 2015-12-11 DIAGNOSIS — C569 Malignant neoplasm of unspecified ovary: Secondary | ICD-10-CM | POA: Insufficient documentation

## 2015-12-11 DIAGNOSIS — Z419 Encounter for procedure for purposes other than remedying health state, unspecified: Secondary | ICD-10-CM

## 2015-12-11 DIAGNOSIS — K219 Gastro-esophageal reflux disease without esophagitis: Secondary | ICD-10-CM | POA: Insufficient documentation

## 2015-12-11 DIAGNOSIS — S32028A Other fracture of second lumbar vertebra, initial encounter for closed fracture: Secondary | ICD-10-CM | POA: Diagnosis present

## 2015-12-11 DIAGNOSIS — Z880 Allergy status to penicillin: Secondary | ICD-10-CM | POA: Diagnosis not present

## 2015-12-11 DIAGNOSIS — Z981 Arthrodesis status: Secondary | ICD-10-CM | POA: Diagnosis not present

## 2015-12-11 DIAGNOSIS — J449 Chronic obstructive pulmonary disease, unspecified: Secondary | ICD-10-CM | POA: Diagnosis not present

## 2015-12-11 DIAGNOSIS — Z87891 Personal history of nicotine dependence: Secondary | ICD-10-CM | POA: Diagnosis not present

## 2015-12-11 DIAGNOSIS — Z85828 Personal history of other malignant neoplasm of skin: Secondary | ICD-10-CM | POA: Insufficient documentation

## 2015-12-11 DIAGNOSIS — S32029A Unspecified fracture of second lumbar vertebra, initial encounter for closed fracture: Secondary | ICD-10-CM | POA: Diagnosis not present

## 2015-12-11 HISTORY — PX: KYPHOPLASTY: SHX5884

## 2015-12-11 SURGERY — KYPHOPLASTY
Anesthesia: General | Site: Back

## 2015-12-11 MED ORDER — SUGAMMADEX SODIUM 200 MG/2ML IV SOLN
INTRAVENOUS | Status: AC
  Filled 2015-12-11: qty 2

## 2015-12-11 MED ORDER — ARTIFICIAL TEARS OP OINT
TOPICAL_OINTMENT | OPHTHALMIC | Status: AC
  Filled 2015-12-11: qty 3.5

## 2015-12-11 MED ORDER — LIDOCAINE 2% (20 MG/ML) 5 ML SYRINGE
INTRAMUSCULAR | Status: AC
  Filled 2015-12-11: qty 5

## 2015-12-11 MED ORDER — FENTANYL CITRATE (PF) 250 MCG/5ML IJ SOLN
INTRAMUSCULAR | Status: AC
  Filled 2015-12-11: qty 5

## 2015-12-11 MED ORDER — LORAZEPAM 0.5 MG PO TABS
1.0000 mg | ORAL_TABLET | Freq: Two times a day (BID) | ORAL | Status: DC | PRN
  Administered 2015-12-11 – 2015-12-12 (×2): 1 mg via ORAL
  Filled 2015-12-11 (×2): qty 2

## 2015-12-11 MED ORDER — PHENYLEPHRINE 40 MCG/ML (10ML) SYRINGE FOR IV PUSH (FOR BLOOD PRESSURE SUPPORT)
PREFILLED_SYRINGE | INTRAVENOUS | Status: AC
  Filled 2015-12-11: qty 10

## 2015-12-11 MED ORDER — VANCOMYCIN HCL IN DEXTROSE 1-5 GM/200ML-% IV SOLN
1000.0000 mg | INTRAVENOUS | Status: AC
  Administered 2015-12-11: 1000 mg via INTRAVENOUS
  Filled 2015-12-11: qty 200

## 2015-12-11 MED ORDER — ONDANSETRON HCL 4 MG/2ML IJ SOLN: 4.0000 mg | INTRAMUSCULAR | Status: DC | PRN

## 2015-12-11 MED ORDER — ONDANSETRON HCL 4 MG PO TABS: 4.0000 mg | ORAL_TABLET | Freq: Three times a day (TID) | ORAL | 0 refills | Status: DC | PRN

## 2015-12-11 MED ORDER — DEXAMETHASONE SODIUM PHOSPHATE 4 MG/ML IJ SOLN
4.0000 mg | Freq: Four times a day (QID) | INTRAMUSCULAR | Status: AC
  Administered 2015-12-11: 4 mg via INTRAVENOUS
  Filled 2015-12-11: qty 1

## 2015-12-11 MED ORDER — SODIUM CHLORIDE 0.9% FLUSH
3.0000 mL | Freq: Two times a day (BID) | INTRAVENOUS | Status: DC
  Administered 2015-12-11 (×2): 3 mL via INTRAVENOUS

## 2015-12-11 MED ORDER — IOPAMIDOL (ISOVUE-300) INJECTION 61%
INTRAVENOUS | Status: DC | PRN
  Administered 2015-12-11: 10 mL

## 2015-12-11 MED ORDER — BUPIVACAINE-EPINEPHRINE (PF) 0.25% -1:200000 IJ SOLN
INTRAMUSCULAR | Status: AC
  Filled 2015-12-11: qty 30

## 2015-12-11 MED ORDER — ONDANSETRON HCL 4 MG/2ML IJ SOLN
INTRAMUSCULAR | Status: DC | PRN
  Administered 2015-12-11: 4 mg via INTRAVENOUS

## 2015-12-11 MED ORDER — MENTHOL 3 MG MT LOZG: 1.0000 | LOZENGE | OROMUCOSAL | Status: DC | PRN

## 2015-12-11 MED ORDER — FENTANYL CITRATE (PF) 100 MCG/2ML IJ SOLN
25.0000 ug | INTRAMUSCULAR | Status: DC | PRN
  Administered 2015-12-11 (×4): 25 ug via INTRAVENOUS

## 2015-12-11 MED ORDER — ROCURONIUM BROMIDE 100 MG/10ML IV SOLN
INTRAVENOUS | Status: DC | PRN
  Administered 2015-12-11: 50 mg via INTRAVENOUS

## 2015-12-11 MED ORDER — LACTATED RINGERS IV SOLN: INTRAVENOUS | Status: DC

## 2015-12-11 MED ORDER — ARTIFICIAL TEARS OP OINT
TOPICAL_OINTMENT | OPHTHALMIC | Status: DC | PRN
  Administered 2015-12-11: 1 via OPHTHALMIC

## 2015-12-11 MED ORDER — PROPOFOL 10 MG/ML IV BOLUS
INTRAVENOUS | Status: AC
  Filled 2015-12-11: qty 20

## 2015-12-11 MED ORDER — ONDANSETRON HCL 4 MG/2ML IJ SOLN
INTRAMUSCULAR | Status: AC
  Filled 2015-12-11: qty 2

## 2015-12-11 MED ORDER — VANCOMYCIN HCL IN DEXTROSE 1-5 GM/200ML-% IV SOLN
1000.0000 mg | Freq: Once | INTRAVENOUS | Status: AC
  Administered 2015-12-11: 1000 mg via INTRAVENOUS
  Filled 2015-12-11: qty 200

## 2015-12-11 MED ORDER — DULOXETINE HCL 30 MG PO CPEP
60.0000 mg | ORAL_CAPSULE | Freq: Every day | ORAL | Status: DC
  Administered 2015-12-11: 60 mg via ORAL
  Filled 2015-12-11: qty 2

## 2015-12-11 MED ORDER — SIMVASTATIN 20 MG PO TABS
20.0000 mg | ORAL_TABLET | Freq: Every evening | ORAL | Status: DC
Start: 2015-12-11 — End: 2015-12-12
  Administered 2015-12-11: 20 mg via ORAL
  Filled 2015-12-11: qty 1

## 2015-12-11 MED ORDER — SUGAMMADEX SODIUM 200 MG/2ML IV SOLN
INTRAVENOUS | Status: DC | PRN
  Administered 2015-12-11: 150 mg via INTRAVENOUS

## 2015-12-11 MED ORDER — LACTATED RINGERS IV SOLN
INTRAVENOUS | Status: DC | PRN
  Administered 2015-12-11 (×2): via INTRAVENOUS

## 2015-12-11 MED ORDER — METHOCARBAMOL 500 MG PO TABS
500.0000 mg | ORAL_TABLET | Freq: Four times a day (QID) | ORAL | Status: DC | PRN
  Administered 2015-12-11 – 2015-12-12 (×2): 500 mg via ORAL
  Filled 2015-12-11 (×2): qty 1

## 2015-12-11 MED ORDER — FENTANYL CITRATE (PF) 100 MCG/2ML IJ SOLN
INTRAMUSCULAR | Status: DC | PRN
  Administered 2015-12-11 (×2): 50 ug via INTRAVENOUS

## 2015-12-11 MED ORDER — FENTANYL CITRATE (PF) 100 MCG/2ML IJ SOLN
INTRAMUSCULAR | Status: AC
  Filled 2015-12-11: qty 2

## 2015-12-11 MED ORDER — FENTANYL CITRATE (PF) 100 MCG/2ML IJ SOLN: 25.0000 ug | INTRAMUSCULAR | Status: DC | PRN

## 2015-12-11 MED ORDER — HYDROCODONE-ACETAMINOPHEN 10-325 MG PO TABS: 1.0000 | ORAL_TABLET | Freq: Four times a day (QID) | ORAL | 0 refills | Status: DC | PRN

## 2015-12-11 MED ORDER — IOPAMIDOL (ISOVUE-300) INJECTION 61%
INTRAVENOUS | Status: AC
  Filled 2015-12-11: qty 50

## 2015-12-11 MED ORDER — MORPHINE SULFATE (PF) 2 MG/ML IV SOLN: 1.0000 mg | INTRAVENOUS | Status: DC | PRN

## 2015-12-11 MED ORDER — BUPIVACAINE-EPINEPHRINE 0.25% -1:200000 IJ SOLN
INTRAMUSCULAR | Status: DC | PRN
  Administered 2015-12-11: 10 mL

## 2015-12-11 MED ORDER — PHENYLEPHRINE HCL 10 MG/ML IJ SOLN
INTRAMUSCULAR | Status: DC | PRN
  Administered 2015-12-11: 120 ug via INTRAVENOUS
  Administered 2015-12-11 (×2): 80 ug via INTRAVENOUS

## 2015-12-11 MED ORDER — METHOCARBAMOL 1000 MG/10ML IJ SOLN: 500.0000 mg | Freq: Four times a day (QID) | INTRAVENOUS | Status: DC | PRN

## 2015-12-11 MED ORDER — PHENOL 1.4 % MT LIQD: 1.0000 | OROMUCOSAL | Status: DC | PRN

## 2015-12-11 MED ORDER — DEXAMETHASONE SODIUM PHOSPHATE 10 MG/ML IJ SOLN
INTRAMUSCULAR | Status: AC
  Filled 2015-12-11: qty 1

## 2015-12-11 MED ORDER — THROMBIN 20000 UNITS EX SOLR
CUTANEOUS | Status: AC
  Filled 2015-12-11: qty 20000

## 2015-12-11 MED ORDER — PROPOFOL 10 MG/ML IV BOLUS
INTRAVENOUS | Status: DC | PRN
  Administered 2015-12-11: 150 mg via INTRAVENOUS

## 2015-12-11 MED ORDER — ROCURONIUM BROMIDE 50 MG/5ML IV SOLN
INTRAVENOUS | Status: AC
  Filled 2015-12-11: qty 1

## 2015-12-11 MED ORDER — MIDAZOLAM HCL 5 MG/5ML IJ SOLN
INTRAMUSCULAR | Status: DC | PRN
  Administered 2015-12-11: 2 mg via INTRAVENOUS

## 2015-12-11 MED ORDER — MIDAZOLAM HCL 2 MG/2ML IJ SOLN
INTRAMUSCULAR | Status: AC
  Filled 2015-12-11: qty 2

## 2015-12-11 MED ORDER — SODIUM CHLORIDE 0.9% FLUSH: 3.0000 mL | INTRAVENOUS | Status: DC | PRN

## 2015-12-11 MED ORDER — DEXAMETHASONE SODIUM PHOSPHATE 10 MG/ML IJ SOLN
INTRAMUSCULAR | Status: DC | PRN
  Administered 2015-12-11: 10 mg via INTRAVENOUS

## 2015-12-11 MED ORDER — OXYCODONE HCL 5 MG PO TABS
10.0000 mg | ORAL_TABLET | ORAL | Status: DC | PRN
  Administered 2015-12-11 – 2015-12-12 (×4): 10 mg via ORAL
  Filled 2015-12-11 (×4): qty 2

## 2015-12-11 MED ORDER — LIDOCAINE HCL (CARDIAC) 20 MG/ML IV SOLN
INTRAVENOUS | Status: DC | PRN
  Administered 2015-12-11: 60 mg via INTRAVENOUS

## 2015-12-11 MED ORDER — DEXAMETHASONE 4 MG PO TABS
4.0000 mg | ORAL_TABLET | Freq: Four times a day (QID) | ORAL | Status: AC
  Administered 2015-12-11 (×2): 4 mg via ORAL
  Filled 2015-12-11 (×2): qty 1

## 2015-12-11 MED ORDER — BUPROPION HCL ER (XL) 300 MG PO TB24
450.0000 mg | ORAL_TABLET | Freq: Every morning | ORAL | Status: DC
  Administered 2015-12-11 – 2015-12-12 (×2): 450 mg via ORAL
  Filled 2015-12-11 (×2): qty 1

## 2015-12-11 MED ORDER — 0.9 % SODIUM CHLORIDE (POUR BTL) OPTIME
TOPICAL | Status: DC | PRN
  Administered 2015-12-11: 1000 mL

## 2015-12-11 SURGICAL SUPPLY — 47 items
BANDAGE ADH SHEER 1  50/CT (GAUZE/BANDAGES/DRESSINGS) ×1 IMPLANT
BLADE SURG 15 STRL LF DISP TIS (BLADE) ×1 IMPLANT
BLADE SURG 15 STRL SS (BLADE)
BONE FILLER DEVICE STRL SZ3 (INSTRUMENTS) IMPLANT
CEMENT BONE KYPHX HV R (Orthopedic Implant) ×2 IMPLANT
CEMENT KYPHON C01A KIT/MIXER (Cement) ×3 IMPLANT
COVER MAYO STAND STRL (DRAPES) ×1 IMPLANT
CURETTE WEDGE 8.5MM KYPHX (MISCELLANEOUS) IMPLANT
DRAPE C-ARM 42X72 X-RAY (DRAPES) ×6 IMPLANT
DRAPE INCISE IOBAN 66X45 STRL (DRAPES) ×3 IMPLANT
DRAPE LAPAROTOMY T 102X78X121 (DRAPES) ×3 IMPLANT
DRAPE PROXIMA HALF (DRAPES) ×2 IMPLANT
DRSG AQUACEL AG ADV 3.5X 4 (GAUZE/BANDAGES/DRESSINGS) ×4 IMPLANT
DRSG AQUACEL AG ADV 3.5X10 (GAUZE/BANDAGES/DRESSINGS) ×1 IMPLANT
DURAPREP 26ML APPLICATOR (WOUND CARE) ×3 IMPLANT
ELECT PENCIL ROCKER SW 15FT (MISCELLANEOUS) ×1 IMPLANT
GAUZE SPONGE 4X4 16PLY XRAY LF (GAUZE/BANDAGES/DRESSINGS) ×1 IMPLANT
GLOVE BIO SURGEON STRL SZ 6.5 (GLOVE) ×2 IMPLANT
GLOVE BIO SURGEONS STRL SZ 6.5 (GLOVE) ×1
GLOVE BIOGEL PI IND STRL 6.5 (GLOVE) ×1 IMPLANT
GLOVE BIOGEL PI INDICATOR 6.5 (GLOVE) ×2
GLOVE SS BIOGEL STRL SZ 8.5 (GLOVE) ×2 IMPLANT
GLOVE SUPERSENSE BIOGEL SZ 8.5 (GLOVE) ×4
GOWN STRL REUS W/ TWL LRG LVL3 (GOWN DISPOSABLE) ×1 IMPLANT
GOWN STRL REUS W/TWL 2XL LVL3 (GOWN DISPOSABLE) ×6 IMPLANT
GOWN STRL REUS W/TWL LRG LVL3 (GOWN DISPOSABLE) ×3
KIT BASIN OR (CUSTOM PROCEDURE TRAY) ×3 IMPLANT
KIT ROOM TURNOVER OR (KITS) ×3 IMPLANT
NDL HYPO 25X1 1.5 SAFETY (NEEDLE) ×1 IMPLANT
NDL SPNL 18GX3.5 QUINCKE PK (NEEDLE) ×2 IMPLANT
NEEDLE HYPO 25X1 1.5 SAFETY (NEEDLE) ×3 IMPLANT
NEEDLE SPNL 18GX3.5 QUINCKE PK (NEEDLE) IMPLANT
NS IRRIG 1000ML POUR BTL (IV SOLUTION) ×3 IMPLANT
PACK SURGICAL SETUP 50X90 (CUSTOM PROCEDURE TRAY) ×3 IMPLANT
PACK UNIVERSAL I (CUSTOM PROCEDURE TRAY) ×3 IMPLANT
PAD ARMBOARD 7.5X6 YLW CONV (MISCELLANEOUS) ×4 IMPLANT
SURGIFLO W/THROMBIN 8M KIT (HEMOSTASIS) IMPLANT
SUT BONE WAX W31G (SUTURE) ×1 IMPLANT
SUT MNCRL AB 4-0 PS2 18 (SUTURE) ×2 IMPLANT
SUT MON AB 3-0 SH 27 (SUTURE)
SUT MON AB 3-0 SH27 (SUTURE) ×1 IMPLANT
SYR CONTROL 10ML LL (SYRINGE) ×3 IMPLANT
TOWEL OR 17X24 6PK STRL BLUE (TOWEL DISPOSABLE) ×3 IMPLANT
TOWEL OR 17X26 10 PK STRL BLUE (TOWEL DISPOSABLE) ×3 IMPLANT
TRAY KYPHOPAK 15/3 ONESTEP 1ST (MISCELLANEOUS) ×2 IMPLANT
TRAY KYPHOPAK 20/3 ONESTEP 1ST (MISCELLANEOUS) ×3 IMPLANT
WATER STERILE IRR 1000ML POUR (IV SOLUTION) ×1 IMPLANT

## 2015-12-11 NOTE — H&P (Signed)
History of Present Illness The patient is a 70 year old female who comes in today for a preoperative History and Physical. The patient is scheduled for a L2 Kyphoplasty to be performed by Dr. Duane Lope D. Rolena Infante, MD at [facility] on 12/11/15 . Please see the hospital record for complete dictated history and physical. Pt has ovarian cancer. The pt is not currently being treated because of side effects to medications. They are also waiting for her to get this surgical intervension.  Additional reasons for visit:  Transition into care is described as the following: The patient is transitioning into care and a summary of care was reviewed.   Problem List/Past Medical Wedge compression fracture of second lumbar vertebra, subsequent encounter for fracture with routine healing (S32.020D)  Problems Reconciled   Allergies SEPTRA [03/11/2010]: PHENERGAN [03/11/2010]: COMPAZINE [03/11/2010]: CODEINE [03/11/2010]: PENICILLIN [03/11/2010]: Allergies Reconciled   Family History No pertinent family history  First Degree Relatives  reported  Social History Tobacco / smoke exposure  yes Tobacco use  former smoker; smoke(d) 1 pack(s) per day Marital status  married Living situation  live with spouse Illicit drug use  no Pain Contract  no Former smoker  Current work status  retired Careers information officer  2 Alcohol use  current drinker Exercise  Exercises daily Drug/Alcohol Rehab (Previously)  no Drug/Alcohol Rehab (Currently)  no  Medication History  Calcitonin ([place]) (200UNIT/ACT Solution, 1 (one) Spray Nasal daily, Taken starting 11/22/2015) Active. (1 spray in one nostril qd) Pantoprazole Sodium (40MG  Tablet DR, Oral) Active. Tamoxifen Citrate (20MG  Tablet, 1 Oral at bedtime) Active. Baby Aspirin (81MG  Tablet Chewable, 1 Oral daily) Active. Wellbutrin SR (150MG  Tablet ER 12HR, 1 Oral daily) Active. LORazepam (1MG  Tablet, Oral as needed) Active. Ambien (5MG   Tablet, 1 Oral at bedtime) Active. Simvastatin (20MG  Tablet, 1 Oral daily) Active. Zolpidem Tartrate (10MG  Tablet, Oral) Active. BuPROPion HCl ER (XL) (150MG  Tablet ER 24HR, Oral) Active. Cimetidine (300MG  Tablet, Oral) Active. DULoxetine HCl (60MG  Capsule DR Part, Oral) Active. Ondansetron HCl (8MG  Tablet, Oral) Active. Medications Reconciled  Vitals  12/03/2015 10:35 AM Weight: 160 lb Height: 63in Body Surface Area: 1.76 m Body Mass Index: 28.34 kg/m  Temp.: 98.24F  Pulse: 100 (Regular)  BP: 98/73 (Sitting, Left Arm, Standard)  General General Appearance-Not in acute distress. Orientation-Oriented X3. Build & Nutrition-Well nourished and Well developed.  Integumentary General Characteristics Surgical Scars - no surgical scar evidence of previous lumbar surgery. Lumbar Spine-Skin examination of the lumbar spine is without deformity, skin lesions, lacerations or abrasions.  Chest and Lung Exam Auscultation Breath sounds - Normal and Clear.  Cardiovascular Auscultation Rhythm - Regular rate and rhythm.  Abdomen Palpation/Percussion Palpation and Percussion of the abdomen reveal - Soft, Non Tender and No Rebound tenderness.  Peripheral Vascular Lower Extremity Palpation - Posterior tibial pulse - Bilateral - 2+. Dorsalis pedis pulse - Bilateral - 2+.  Neurologic Sensation Lower Extremity - Bilateral - sensation is intact in the lower extremity. Reflexes Patellar Reflex - Bilateral - 2+. Achilles Reflex - Bilateral - 2+. Clonus - Bilateral - clonus not present. Hoffman's Sign - Bilateral - Hoffman's sign not present. Testing Seated Straight Leg Raise - Bilateral - Seated straight leg raise negative.  Musculoskeletal Spine/Ribs/Pelvis  Lumbosacral Spine: Inspection and Palpation - Tenderness - left lumbar paraspinals tender to palpation and right lumbar paraspinals tender to palpation. Note: TTP of the left hip and left Proximal  LE. Strength and Tone: Strength - Hip Flexion - Bilateral - 5/5. Knee Extension - Bilateral -  5/5. Knee Flexion - Bilateral - 5/5. Ankle Dorsiflexion - Bilateral - 5/5. Ankle Plantarflexion - Bilateral - 5/5. Heel walk - Bilateral - unable to heel walk. Toe Walk - Bilateral - unable to walk on toes. Heel-Toe Walk - Bilateral - able to heel-toe walk with moderate difficulty. ROM - Flexion - severely decreased range of motion and painful. Extension - severely decreased range of motion and painful. Left Lateral Bending - severely decreased range of motion and painful. Right Lateral Bending - severely decreased range of motion and painful. Right Rotation - severely decreased range of motion and painful. Left Rotation - severely decreased range of motion and painful. Pain - . Lumbosacral Spine - Waddell's Signs - no Waddell's signs present. Lower Extremity Range of Motion - No true hip, knee or ankle pain with range of motion. Gait and Station - [employer] - no assistive devices.  Assessment:  Very pleasant 70 year old female patient who was referred to [place] by Dr. Nelva Bush. The patient has a history of ovarian cancer as well as a history of two previous vertebral fractures at L4 and 5 and kyphoplasties done by Dr. Estanislado Pandy. The patient reports slipping in the shower, a couple opf weeks ago, and heard a cracking sound. Unfortunately, upon imaging it was noted that the patient had a fracture of L2. The patient also has a significant dextroscoliosis curve and degenerative disc disease at L3-4, L4-5 and degenerative facet arthrosis especially at L5-S1. The patient reports that her pain level started after the fracture of L2 a couple of weeks ago. She does report having chronic low back pain, but that her baseline back pain is not as severe as she is dealing with currently. The patient has tried bracing in the past, but unfortunately says she is unable to tolerate wearing the brace. The patient requests kyphoplasty  and would like to have it done under general anesthesia. she had it done in the past with a spinal block and unfortunately she found that quite painful. The patient has also unfortunately had to restart chemotherapy, but that is on hold currently because of her severe back pain.  Goal Of Surgery: Discussed that goal of surgery is to reduce pain and improve function and quality of life. Patient is aware that despite all appropriate treatment that there pain and function could be the same, worse, or different.  Risks: infection, bleeding nerve damage, death, stoke, paralysis, leak of cement, need for additional surgery, fracture of other levels, ongoing or worse pain.  All questions elicited and answered

## 2015-12-11 NOTE — Care Management CC44 (Signed)
Condition Code 44 Documentation Completed  Patient Details  Name: [name] MRN: [id number] Date of Birth: Sep 28, 1945   Condition Code 44 given:  Yes Patient signature on Condition Code 44 notice:  Yes Documentation of 2 MD's agreement:  Yes Code 44 added to claim:  Yes    Ninfa Meeker, RN 12/11/2015, 4:37 PM

## 2015-12-11 NOTE — Care Management Obs Status (Signed)
[place] NOTIFICATION   Patient Details  Name: SHANZAY ARMBRECHT MRN: [id number] Date of Birth: 31-Jan-1946   Medicare Observation Status Notification Given:  Yes    Ninfa Meeker, RN 12/11/2015, 4:36 PM

## 2015-12-11 NOTE — Care Management Obs Status (Signed)
[place] NOTIFICATION   Patient Details  Name: [name] MRN: [id number] Date of Birth: 1946-03-01   Medicare Observation Status Notification Given:  Yes    Ninfa Meeker, RN 12/11/2015, 4:37 PM

## 2015-12-11 NOTE — Anesthesia Postprocedure Evaluation (Signed)
Anesthesia Post Note  Patient: [name]  Procedure(s) Performed: Procedure(s) (LRB): L2 KYPHOPLASTY (N/A)  Patient location during evaluation: PACU Anesthesia Type: General Level of consciousness: awake Pain management: pain level controlled Respiratory status: spontaneous breathing Cardiovascular status: stable Anesthetic complications: no    Last Vitals:  Vitals:   12/11/15 1100 12/11/15 1107  BP:    Pulse: 91   Resp: 14   Temp:  36.8 C    Last Pain:  Vitals:   12/11/15 1020  TempSrc:   PainSc: 5       LLE Sensation: Full sensation;No numbness;No tingling (12/11/15 1107)   RLE Sensation: Full sensation;No numbness;No tingling (12/11/15 1107)      EDWARDS,Marchele [place]

## 2015-12-11 NOTE — Anesthesia Preprocedure Evaluation (Addendum)
Anesthesia Evaluation  Patient identified by MRN, date of birth, ID band Patient awake    Reviewed: Allergy & Precautions, NPO status , Patient's Chart, lab work & pertinent test results  Airway Mallampati: II  TM Distance: >3 FB Neck ROM: Full    Dental   Pulmonary shortness of breath, pneumonia, COPD, former smoker,    breath sounds clear to auscultation       Cardiovascular negative cardio ROS  + Valvular Problems/Murmurs  Rhythm:Regular Rate:Normal     Neuro/Psych Seizures -,     GI/Hepatic Neg liver ROS, hiatal hernia, GERD  ,  Endo/Other  negative endocrine ROS  Renal/GU negative Renal ROS     Musculoskeletal   Abdominal   Peds  Hematology   Anesthesia Other Findings   Reproductive/Obstetrics                           Anesthesia Physical Anesthesia Plan  ASA: III  Anesthesia Plan: General   Post-op Pain Management:    Induction: Intravenous  Airway Management Planned: Oral ETT  Additional Equipment:   Intra-op Plan:   Post-operative Plan: Possible Post-op intubation/ventilation  Informed Consent: I have reviewed the patients History and Physical, chart, labs and discussed the procedure including the risks, benefits and alternatives for the proposed anesthesia with the patient or authorized representative who has indicated his/her understanding and acceptance.   Dental advisory given  Plan Discussed with: CRNA and Anesthesiologist  Anesthesia Plan Comments:         Anesthesia Quick Evaluation

## 2015-12-11 NOTE — Op Note (Signed)
NAME:  [name], [name]              ACCOUNT NO.:  [id number]  MEDICAL RECORD NO.:  [id number]  LOCATION:  [facility]                         FACILITY:  [facility]  PHYSICIAN:  Nature Vogelsang D. Rolena Infante, M.D. DATE OF BIRTH:  02-Jan-1946  DATE OF PROCEDURE:  12/11/2014 DATE OF DISCHARGE:                              OPERATIVE REPORT   PREOPERATIVE DIAGNOSIS:  L2 osteoporotic rib fracture.  POSTOPERATIVE DIAGNOSIS:  L2 osteoporotic rib fracture.  OPERATIVE PROCEDURE:  L2 kyphoplasty.  HISTORY:  This is a very pleasant 70 year old woman who had a previous L4-L5 kyphoplasty, was doing well until recently when she slipped getting into the bathtub.  She had an acute increase in her chronic back pain in the mid lumbar region.  Imaging studies confirmed not only the significant degenerative scoliosis, but a new L2 osteoporotic compression fracture.  As a result of failed conservative management, we elected to proceed with a kyphoplasty.  All appropriate risks, benefits, and alternatives were discussed with the patient.  Consent was obtained.  OPERATIVE NOTE:  The patient was brought to the operating room and placed supine on the operating table.  After successful induction of general anesthesia and endotracheal intubation, she was turned prone onto a chest and pelvic roll.  The arms were tucked at the side and the back was prepped and draped in a standard fashion.  Time-out was taken to confirm patient, procedure, and all other pertinent important data. Once that was completed, x-ray was brought into the field.  I identified the L2 vertebral body counting up from the L5 level.  Once I confirmed this, I made sure the C-arm angles were corrected for the scoliosis and I had excellent images of the pedicle in both planes.  Once this was done, I identified the lateral border of the pedicles and infiltrated the skin area with 0.25% Marcaine and then I made a small stab incision. I advanced the left pedicle Jamshidi  needle down to the lateral aspect of the pedicle and advanced it through the pedicle towards the posterior vertebral body.  Once I confirmed, I was just at the posterior vertebral body in the lateral plane.  I confirmed I was still within the pedicle circle on the AP plane.  I then advanced into the posterior aspect of the vertebral body.  I then made a second stab incision on the right side and advanced the Jamshidi needle.  It is at this time that I realized that the trajectory would be off, so I went out lateral.  I made a second stab incision which allowed me better trajectory of the pedicle.  Once I had both Jamshidi needles properly placed, I then used a drill and then inserted the inflatable bone tamp.  I inflated and then deflated and then filled the bone with the cement.  There was no leak of cement noted.  Once the cement was allowed to harden, the trocars were removed.  The skin was clean and the 3 stab incisions were closed with 4- 0 Monocryl stitch in a simple suture.  Aquacel dressing was applied and the patient was extubated and transferred to the PACU without incident.  At the end of the case, all needle and  sponge counts were correct.  There were no adverse intraoperative events.  The patient will be admitted overnight and ambulate today and ultimately be discharged to home tomorrow.     De Jaworski D. Rolena Infante, M.D.     DDB/MEDQ  D:  12/11/2015  T:  12/11/2015  Job:  [id number]

## 2015-12-11 NOTE — Brief Op Note (Signed)
12/11/2015  9:41 AM  PATIENT:  Terence Lux  70 y.o. female  PRE-OPERATIVE DIAGNOSIS:  L2 FRACTURE   POST-OPERATIVE DIAGNOSIS:  L2 FRACTURE   PROCEDURE:  Procedure(s): L2 KYPHOPLASTY (N/A)  SURGEON:  Surgeon(s) and Role:    * Melina Schools, MD - Primary  PHYSICIAN ASSISTANT:   ASSISTANTS: none   ANESTHESIA:   general  EBL:  No intake/output data recorded.  BLOOD ADMINISTERED:none  DRAINS: none   LOCAL MEDICATIONS USED:  MARCAINE     SPECIMEN:  No Specimen  DISPOSITION OF SPECIMEN:  N/A  COUNTS:  YES  TOURNIQUET:  * No tourniquets in log *  DICTATION: .Other Dictation: Dictation Number [id number]  PLAN OF CARE: Admit for overnight observation  PATIENT DISPOSITION:  PACU - hemodynamically stable.

## 2015-12-11 NOTE — Evaluation (Signed)
Physical Therapy Evaluation Patient Details Name: [name] MRN: [id number] DOB: 1946/03/21 Today's Date: 12/11/2015   History of Present Illness  Pt presents for kyphoplasty after L2 fracture, has had same procedure at different level 2 other times. Has ovarian cancer and has currently stopped treatment due to back pain  Clinical Impression  Patient evaluated by Physical Therapy with no further acute PT needs identified. All education has been completed and the patient has no further questions. Pt ambulated 200' with RW, mod I, recommend use of RW during acute phase for fall prevention given her risk of fractures.  See below for any follow-up Physical Therapy or equipment needs. PT is signing off. Thank you for this referral.     Follow Up Recommendations No PT follow up    Equipment Recommendations  None recommended by PT    Recommendations for Other Services       Precautions / Restrictions Precautions Precautions: Back Precaution Booklet Issued: Yes (comment) Precaution Comments: reviewed and gave ho for long term health of her back Restrictions Weight Bearing Restrictions: No      Mobility  Bed Mobility Overal bed mobility: Modified Independent             General bed mobility comments: vc's for rolling before sitting up, this helped manage pt's pain  Transfers Overall transfer level: Modified independent Equipment used: Rolling walker (2 wheeled)             General transfer comment: safe use of RW, safe hand placement  Ambulation/Gait Ambulation/Gait assistance: Modified independent (Device/Increase time) Ambulation Distance (Feet): 200 Feet Assistive device: Rolling walker (2 wheeled) Gait Pattern/deviations: Step-through pattern Gait velocity: decreased, guarded Gait velocity interpretation: Below normal speed for age/gender General Gait Details: recommend use of RW for pt given risk for fractures. vc's for posture with use of RW  Stairs             Wheelchair Mobility    Modified Rankin (Stroke Patients Only)       Balance Overall balance assessment: Modified Independent                                           Pertinent Vitals/Pain Pain Assessment: Faces Faces Pain Scale: Hurts little more Pain Location: back Pain Descriptors / Indicators: Aching Pain Intervention(s): Limited activity within patient's tolerance;Monitored during session    Home Living Family/patient expects to be discharged to:: Private residence Living Arrangements: Children Available Help at Discharge: Family;Available 24 hours/day Type of Home: House Home Access: Stairs to enter Entrance Stairs-Rails: Right;Left;Can reach both Entrance Stairs-Number of Steps: 4 Home Layout: One level Home Equipment: Walker - 2 wheels;Cane - single point Additional Comments: pt's grandson has developmental disability but can help her physically and is home all the time    Prior Function Level of Independence: Independent               Hand Dominance        Extremity/Trunk Assessment   Upper Extremity Assessment: Overall WFL for tasks assessed           Lower Extremity Assessment: Overall WFL for tasks assessed      Cervical / Trunk Assessment: Normal  Communication   Communication: No difficulties  Cognition Arousal/Alertness: Awake/alert Behavior During Therapy: WFL for tasks assessed/performed Overall Cognitive Status: Within Functional Limits for tasks assessed  General Comments      Exercises        Assessment/Plan    PT Assessment Patent does not need any further PT services  PT Diagnosis Acute pain   PT Problem List    PT Treatment Interventions     PT Goals (Current goals can be found in the Care Plan section) Acute Rehab PT Goals Patient Stated Goal: return home PT Goal Formulation: All assessment and education complete, DC therapy    Frequency      Barriers to discharge        Co-evaluation               End of Session   Activity Tolerance: Patient tolerated treatment well Patient left: in bed;with call bell/phone within reach Nurse Communication: Mobility status    Functional Assessment Tool Used: clinical judgement' Functional Limitation: Mobility: Walking and moving around Mobility: Walking and Moving Around Current Status [id number]): At least 1 percent but less than 20 percent impaired, limited or restricted Mobility: Walking and Moving Around Goal Status [phone]): At least 1 percent but less than 20 percent impaired, limited or restricted Mobility: Walking and Moving Around Discharge Status [phone]): At least 1 percent but less than 20 percent impaired, limited or restricted    Time: 1535-1600 PT Time Calculation (min) (ACUTE ONLY): 25 min   Charges:   PT Evaluation $PT Eval Low Complexity: 1 Procedure PT Treatments $Gait Training: 8-22 mins   PT G Codes:   PT G-Codes **NOT FOR INPATIENT CLASS** Functional Assessment Tool Used: clinical judgement' Functional Limitation: Mobility: Walking and moving around Mobility: Walking and Moving Around Current Status [id number]): At least 1 percent but less than 20 percent impaired, limited or restricted Mobility: Walking and Moving Around Goal Status [phone]): At least 1 percent but less than 20 percent impaired, limited or restricted Mobility: Walking and Moving Around Discharge Status [phone]): At least 1 percent but less than 20 percent impaired, limited or restricted  Leighton Roach, [place]  [place], Justin 12/11/2015, 4:15 PM

## 2015-12-11 NOTE — Transfer of Care (Signed)
Immediate Anesthesia Transfer of Care Note  Patient: [name]  Procedure(s) Performed: Procedure(s): L2 KYPHOPLASTY (N/A)  Patient Location: PACU  Anesthesia Type:General  Level of Consciousness:  sedated, patient cooperative and responds to stimulation  Airway & Oxygen Therapy:Patient Spontanous Breathing and Patient connected to face mask oxgen  Post-op Assessment:  Report given to PACU RN and Post -op Vital signs reviewed and stable  Post vital signs:  Reviewed and stable  Last Vitals:  Vitals:   12/11/15 0737  BP: 137/62  Pulse: (!) 115  Resp: 18  Temp: [id number] C    Complications: No apparent anesthesia complications

## 2015-12-11 NOTE — Care Management Obs Status (Signed)
[place] NOTIFICATION   Patient Details  Name: [name] MRN: [id number] Date of Birth: July 11, 1945   Medicare Observation Status Notification Given:  Yes    Ninfa Meeker, RN 12/11/2015, 4:36 PM

## 2015-12-11 NOTE — Anesthesia Procedure Notes (Signed)
Procedure Name: Intubation Date/Time: 12/11/2015 8:54 AM Performed by: Freddie Breech Pre-anesthesia Checklist: Patient identified, Emergency Drugs available, Suction available, Patient being monitored and Timeout performed Patient Re-evaluated:Patient Re-evaluated prior to inductionOxygen Delivery Method: Circle system utilized Preoxygenation: Pre-oxygenation with 100% oxygen Intubation Type: IV induction Ventilation: Mask ventilation without difficulty and Oral airway inserted - appropriate to patient size Laryngoscope Size: Mac and 3 Grade View: Grade I Tube type: Oral Tube size: 7.0 mm Number of attempts: 1 Airway Equipment and Method: Patient positioned with wedge pillow and Stylet Placement Confirmation: ETT inserted through vocal cords under direct vision,  positive ETCO2 and breath sounds checked- equal and bilateral Secured at: 22 cm Tube secured with: Tape Dental Injury: Teeth and Oropharynx as per pre-operative assessment

## 2015-12-12 ENCOUNTER — Encounter (HOSPITAL_COMMUNITY): Payer: Self-pay | Admitting: Orthopedic Surgery

## 2015-12-12 DIAGNOSIS — Z880 Allergy status to penicillin: Secondary | ICD-10-CM | POA: Diagnosis not present

## 2015-12-12 DIAGNOSIS — M8008XA Age-related osteoporosis with current pathological fracture, vertebra(e), initial encounter for fracture: Secondary | ICD-10-CM | POA: Diagnosis not present

## 2015-12-12 DIAGNOSIS — C569 Malignant neoplasm of unspecified ovary: Secondary | ICD-10-CM | POA: Diagnosis not present

## 2015-12-12 DIAGNOSIS — Z85828 Personal history of other malignant neoplasm of skin: Secondary | ICD-10-CM | POA: Diagnosis not present

## 2015-12-12 DIAGNOSIS — J449 Chronic obstructive pulmonary disease, unspecified: Secondary | ICD-10-CM | POA: Diagnosis not present

## 2015-12-12 DIAGNOSIS — Z87891 Personal history of nicotine dependence: Secondary | ICD-10-CM | POA: Diagnosis not present

## 2015-12-12 MED ORDER — PANTOPRAZOLE SODIUM 40 MG PO TBEC
40.0000 mg | DELAYED_RELEASE_TABLET | Freq: Two times a day (BID) | ORAL | Status: DC
  Administered 2015-12-12 (×2): 40 mg via ORAL
  Filled 2015-12-12: qty 1

## 2015-12-12 NOTE — Evaluation (Signed)
Occupational Therapy Evaluation and Discharge Patient Details Name: [name] MRN: [id number] DOB: 1945/10/25 Today's Date: 12/12/2015    History of Present Illness Pt presents for kyphoplasty after L2 fracture, has had same procedure at different level 2 other times. Has ovarian cancer and has currently stopped treatment due to back pain   Clinical Impression   Pt reports she was independent with ADL PTA. Currently pt is overall supervision with ADL and functional mobility with the exception of min assist for LB ADL; pt reports daughter can assist as needed upon return home. Provided pt with back, safety, and ADL education; she verbalized understanding. Pt planning to d/c home with 24/7 supervision from family. No further acute OT needs identified; signing off at this time. Please re-consult if needs change. Thank you for this referral.     Follow Up Recommendations  No OT follow up;Supervision/Assistance - 24 hour    Equipment Recommendations  None recommended by OT    Recommendations for Other Services       Precautions / Restrictions Precautions Precautions: Back Precaution Comments: Reviewed back precautions with pt Restrictions Weight Bearing Restrictions: No      Mobility Bed Mobility Overal bed mobility: Modified Independent             General bed mobility comments: Good log roll technique. Use of bed rail, HOB flat.  Transfers Overall transfer level: Needs assistance Equipment used: Rolling walker (2 wheeled) Transfers: Sit to/from Stand Sit to Stand: Supervision         General transfer comment: Supervision for safety; no physical assist required. Good hand placement and safe technique.    Balance Overall balance assessment: No apparent balance deficits (not formally assessed)                                          ADL Overall ADL's : Needs assistance/impaired Eating/Feeding: Independent;Sitting   Grooming:  Supervision/safety;Standing   Upper Body Bathing: Supervision/ safety;Sitting   Lower Body Bathing: Minimal assistance;Sit to/from stand   Upper Body Dressing : Supervision/safety;Sitting Upper Body Dressing Details (indicate cue type and reason): to don hospital gown Lower Body Dressing: Minimal assistance;Sit to/from stand Lower Body Dressing Details (indicate cue type and reason): Pt unable to cross foot over opposite knee. Reports daughter can assist as needed. Toilet Transfer: Supervision/safety;Ambulation;BSC;RW Toilet Transfer Details (indicate cue type and reason): Simulated by sit to stand from EOB with functional mobility. Toileting- Clothing Manipulation and Hygiene: Supervision/safety;Sit to/from stand   Tub/ Shower Transfer: Supervision/safety;Walk-in shower;Ambulation;Shower seat;Rolling walker;Grab Paediatric nurse Details (indicate cue type and reason): Pt able to demo safe technique for shower transfer. Recommended daughter supervise upon return home initially. Functional mobility during ADLs: Supervision/safety;Rolling walker General ADL Comments: Pt reports she has had this sx previously and her husband was recently sick so she understands good body mechanics and back precautions during functional activities. Reviewed maintaining back precautions during functional activities; pt verbalized understanding.      Vision Vision Assessment?: No apparent visual deficits   Perception     Praxis      Pertinent Vitals/Pain Pain Assessment: Faces Faces Pain Scale: Hurts little more Pain Location: R buttocks/hip Pain Descriptors / Indicators: Spasm;Cramping Pain Intervention(s): Monitored during session;Repositioned     Hand Dominance     Extremity/Trunk Assessment Upper Extremity Assessment Upper Extremity Assessment: Overall WFL for tasks assessed   Lower Extremity Assessment Lower  Extremity Assessment: Defer to PT evaluation   Cervical / Trunk  Assessment Cervical / Trunk Assessment: Normal   Communication Communication Communication: No difficulties   Cognition Arousal/Alertness: Awake/alert Behavior During Therapy: WFL for tasks assessed/performed Overall Cognitive Status: Within Functional Limits for tasks assessed                     General Comments       Exercises       Shoulder Instructions      Home Living Family/patient expects to be discharged to:: Private residence Living Arrangements: Children Available Help at Discharge: Family;Available 24 hours/day Type of Home: House Home Access: Stairs to enter [employer] of Steps: 4 Entrance Stairs-Rails: Right;Left;Can reach both Home Layout: One level     Bathroom Shower/Tub: Occupational psychologist: Handicapped height     Home Equipment: Environmental consultant - 2 wheels;Cane - single point;Bedside commode;Shower seat;Grab bars - tub/shower          Prior Functioning/Environment Level of Independence: Independent             OT Diagnosis: Acute pain   OT Problem List:     OT Treatment/Interventions:      OT Goals(Current goals can be found in the care plan section) Acute Rehab OT Goals Patient Stated Goal: return home OT Goal Formulation: All assessment and education complete, DC therapy  OT Frequency:     Barriers to D/C:            Co-evaluation              End of Session Equipment Utilized During Treatment: Rolling walker Nurse Communication: Mobility status  Activity Tolerance: Patient tolerated treatment well Patient left: in chair;with call bell/phone within reach   Time: 0805-0818 OT Time Calculation (min): 13 min Charges:  OT General Charges $OT Visit: 1 Procedure OT Evaluation $OT Eval Moderate Complexity: 1 Procedure G-Codes: OT G-codes **NOT FOR INPATIENT CLASS** Functional Assessment Tool Used: Clinical judgement Functional Limitation: Self care Self Care Current Status [id number]): At least 1  percent but less than 20 percent impaired, limited or restricted Self Care Goal Status [id number]): At least 1 percent but less than 20 percent impaired, limited or restricted Self Care Discharge Status [id number]): At least 1 percent but less than 20 percent impaired, limited or restricted   Binnie Kand M.S., OTR/L Pager: [phone]  12/12/2015, 8:25 AM

## 2015-12-12 NOTE — Progress Notes (Signed)
Patient ID: [name], female   DOB: Jan 02, 1946, 70 y.o.   MRN: [id number]    Subjective: 1 Day Post-Op Procedure(s) (LRB): L2 KYPHOPLASTY (N/A) Patient reports pain as 4 on 0-10 scale.in her right buttuck, low incisional pain Denies CP or SOB.  Voiding without difficulty. Positive flatus. Objective: Vital signs in last 24 hours: Temp:  [97.9 F (36.6 C)-98.8 F (37.1 C)] 98.3 F (36.8 C) (08/03 0407) Pulse Rate:  [84-115] 88 (08/03 0407) Resp:  [11-26] 18 (08/03 0407) BP: (94-153)/(59-85) 125/63 (08/03 0407) SpO2:  [95 %-100 %] 95 % (08/03 0407) Weight:  [80.7 kg (178 lb)] 80.7 kg (178 lb) (08/02 0737)  Intake/Output from previous day: 08/02 0701 - 08/03 0700 In: 1840 [P.O.:840; I.V.:1000] Out: 30 [Blood:30] Intake/Output this shift: No intake/output data recorded.  Labs: No results for input(s): HGB in the last 72 hours. No results for input(s): WBC, RBC, HCT, PLT in the last 72 hours. No results for input(s): NA, K, CL, CO2, BUN, CREATININE, GLUCOSE, CALCIUM in the last 72 hours. No results for input(s): LABPT, INR in the last 72 hours.  Physical Exam: Neurologically intact ABD soft Sensation intact distally Incision: no drainage Compartment soft  Assessment/Plan: 1 Day Post-Op Procedure(s) (LRB): L2 KYPHOPLASTY (N/A) Advance diet Up with therapy  Pt may Dc home after cleared by PT  Sacred Roa, Darla Lesches for Dr. Melina Schools [facility] [phone] 12/12/2015, 7:22 AM

## 2015-12-12 NOTE — Progress Notes (Signed)
Patient alert and oriented, mae's well, voiding adequate amount of urine, swallowing without difficulty, no c/o pain. Patient discharged home with family. Script and discharged instructions given to patient. Patient and family stated understanding of d/c instructions given and has an appointment with MD. 

## 2015-12-16 DIAGNOSIS — S32020A Wedge compression fracture of second lumbar vertebra, initial encounter for closed fracture: Secondary | ICD-10-CM | POA: Diagnosis not present

## 2015-12-16 DIAGNOSIS — R030 Elevated blood-pressure reading, without diagnosis of hypertension: Secondary | ICD-10-CM | POA: Diagnosis not present

## 2015-12-16 DIAGNOSIS — C569 Malignant neoplasm of unspecified ovary: Secondary | ICD-10-CM | POA: Diagnosis not present

## 2015-12-16 DIAGNOSIS — F329 Major depressive disorder, single episode, unspecified: Secondary | ICD-10-CM | POA: Diagnosis not present

## 2015-12-17 NOTE — Discharge Summary (Signed)
Physician Discharge Summary  Patient ID: [name] MRN: [id number] DOB/AGE: 09/22/1945 70 y.o.  Admit date: 12/11/2015 Discharge date: 12/17/2015  Admission Diagnoses:  L2 Fracture  Discharge Diagnoses:  Active Problems:   L2 vertebral fracture [facility])   Past Medical History:  Diagnosis Date  . Anxiety   . Cancer [facility])    '10-Ovarian cancer(tx. surgery, chemotherapy) -residual lymph edema lt. leg,restart of chemotherapy this week-Dr. Verne Spurr)  . COPD (chronic obstructive pulmonary disease) (HCC)    mild  . Depression   . GERD (gastroesophageal reflux disease)   . Heart murmur   . History of hiatal hernia   . Lymph edema 03-10-13   left leg( hip to foot)-consistent-remains an issue 10-11-14-wears compression hose  . Mitral valve prolapse 03-10-13   rare palpitations  . Nodule of neck 08-21-13   Chemotherapy to start this week for this(10-11-14 was told a yr ago- couldn't feel anything after chemo tx.)  . Pneumonia   . Portacath in place 08-21-13   left chest remains as of 10-11-14  . Seizures ([place])    '70- x1 post Phenergan IV for nausea during pregnancy  . Shortness of breath dyspnea   . Squamous cell skin cancer    left leg- one area recently excised, anther area is planned  . Transfusion history    during chemotherapy, none recent  . Wrist fracture    11'14-casted only,no surgery-no problems now    Surgeries: Procedure(s): L2 KYPHOPLASTY on 12/11/2015   Consultants (if any):   Discharged Condition: Improved  Hospital Course: [name] is an 70 y.o. female who was admitted 12/11/2015 with a diagnosis of L3 Fracture and went to the operating room on 12/11/2015 and underwent the above named procedures.  Discharged on 12/12/15.  She was given perioperative antibiotics:  Anti-infectives    Start     Dose/Rate Route Frequency Ordered Stop   12/11/15 2000  vancomycin (VANCOCIN) IVPB 1000 mg/200 mL premix     1,000 mg 200 mL/hr over 60 Minutes Intravenous   Once 12/11/15 1146 12/11/15 2147   12/11/15 0548  vancomycin (VANCOCIN) IVPB 1000 mg/200 mL premix     1,000 mg 200 mL/hr over 60 Minutes Intravenous 60 min pre-op 12/11/15 0548 12/11/15 0908    .  She was given sequential compression devices, early ambulation, and TED for DVT prophylaxis.  She benefited maximally from the hospital stay and there were no complications.    Recent vital signs:  Vitals:   12/11/15 2340 12/12/15 0407  BP: 125/74 125/63  Pulse: 96 88  Resp: 16 18  Temp: 97.9 F (36.6 C) 98.3 F (36.8 C)    Recent laboratory studies:  Lab Results  Component Value Date   HGB 12.0 12/03/2015   HGB 12.6 03/02/2014   HGB 9.1 (L) 12/12/2013   Lab Results  Component Value Date   WBC 6.5 12/03/2015   PLT 360 12/03/2015   Lab Results  Component Value Date   INR 0.91 03/02/2014   Lab Results  Component Value Date   NA 138 12/03/2015   K 3.5 12/03/2015   CL 103 12/03/2015   CO2 29 12/03/2015   BUN 16 12/03/2015   CREATININE 1.01 (H) 12/03/2015   GLUCOSE 117 (H) 12/03/2015    Discharge Medications:     Medication List    STOP taking these medications   calcitonin (salmon) 200 UNIT/ACT nasal spray Commonly known as:  MIACALCIN/FORTICAL   ibuprofen 200 MG tablet Commonly known as:  ADVIL,MOTRIN  TAKE these medications   aspirin 81 MG tablet Take 81 mg by mouth daily.   buPROPion 300 MG 24 hr tablet Commonly known as:  WELLBUTRIN XL Take 450 mg by mouth every morning.   DULoxetine 60 MG capsule Commonly known as:  CYMBALTA Take 60 mg by mouth at bedtime.   HYDROcodone-acetaminophen 10-325 MG tablet Commonly known as:  NORCO Take 1 tablet by mouth every 6 (six) hours as needed.   LORazepam 1 MG tablet Commonly known as:  ATIVAN Take 1 mg by mouth 2 (two) times daily as needed for anxiety.   multivitamin with minerals Tabs tablet Take 1 tablet by mouth daily.   ondansetron 4 MG tablet Commonly known as:  ZOFRAN Take 1 tablet (4 mg  total) by mouth every 8 (eight) hours as needed for nausea or vomiting.   pantoprazole 40 MG tablet Commonly known as:  PROTONIX Take 40 mg by mouth 2 (two) times daily.   simvastatin 20 MG tablet Commonly known as:  ZOCOR Take 20 mg by mouth every evening.   zolpidem 10 MG tablet Commonly known as:  AMBIEN Take 10 mg by mouth at bedtime as needed for sleep.       Diagnostic Studies: Dg Lumbar Spine 2-3 Views  Result Date: 12/11/2015 CLINICAL DATA:  L2 kyphoplasty EXAM: DG C-ARM 61-120 MIN; LUMBAR SPINE - 2-3 VIEW COMPARISON:  11/19/2015 FINDINGS: There are changes consistent with interval L2 kyphoplasty. Contrast laden cement is noted throughout the vertebral body. Some extension to the superior fracture line into the disc space and anterior paraspinal space is noted. 1 minute 41 seconds of fluoroscopy was utilized for this exam. IMPRESSION: L2 kyphoplasty. Electronically Signed   By: Inez Catalina M.D.   On: 12/11/2015 10:00   Mr Lumbar Spine W Wo Contrast  Result Date: 11/19/2015 CLINICAL DATA:  Closed wedge compression fracture of lumbar vertebra, with routine healing, subsequent encounter S32.000D (ICD-10-CM) EXAM: MRI LUMBAR SPINE WITHOUT AND WITH CONTRAST TECHNIQUE: Multiplanar and multiecho pulse sequences of the lumbar spine were obtained without and with intravenous contrast. CONTRAST:  29mL MULTIHANCE GADOBENATE DIMEGLUMINE 529 MG/ML IV SOLN COMPARISON:  Lumbar MRI 02/27/2014 FINDINGS: Segmentation:  Normal segmentation.  Lowest disc space L5-S1 Alignment:  Moderate dextroscoliosis.  Mild retrolisthesis L2-3. Vertebrae: Compression fracture L2 with diffuse bone marrow edema enhancement consistent with a recent fracture. This was not present previously. There is depression of the inferior and superior endplates. No significant retropulsion into the canal. Chronic compression fractures with cement vertebral augmentation L4 and L5 vertebral bodies similar to the prior study. No other  acute fracture. Negative for metastatic disease. Conus medullaris: Extends to the L1-2 level and appears normal. Paraspinal and other soft tissues: Paraspinous muscles are symmetric without focal lesion. No retroperitoneal mass or adenopathy. Disc levels: L1-2:  Mild disc bulging and spurring without stenosis L2-3: Mild retrolisthesis. Disc bulging and endplate spurring. Moderate foraminal narrowing bilaterally L3-4: Diffuse disc bulging and mild spurring. Mild facet degeneration. Mild to moderate left foraminal narrowing without significant spinal stenosis. L4-5: Disc bulging and mild spurring. Mild to moderate right foraminal narrowing. No significant spinal stenosis. L5-S1: Mild disc and facet degeneration. Moderate right foraminal narrowing which is unchanged from the prior MRI. IMPRESSION: 50% compression fracture L2 with diffuse bone marrow edema and enhancement consistent with a recent fracture. This appears to be benign. Chronic fractures L4-L5 with cement vertebral augmentation Lumbar degenerative changes as above. Overall these changes are similar to the prior MRI Electronically Signed   By:  Franchot Gallo M.D.   On: 11/19/2015 18:38   Dg C-arm 1-60 Min  Result Date: 12/11/2015 CLINICAL DATA:  L2 kyphoplasty EXAM: DG C-ARM 61-120 MIN; LUMBAR SPINE - 2-3 VIEW COMPARISON:  11/19/2015 FINDINGS: There are changes consistent with interval L2 kyphoplasty. Contrast laden cement is noted throughout the vertebral body. Some extension to the superior fracture line into the disc space and anterior paraspinal space is noted. 1 minute 41 seconds of fluoroscopy was utilized for this exam. IMPRESSION: L2 kyphoplasty. Electronically Signed   By: Inez Catalina M.D.   On: 12/11/2015 10:00    Disposition: 01-Home or Self Care    Follow-up Information    BROOKS,DAHARI D, MD. Schedule an appointment as soon as possible for a visit in 2 weeks.   Specialty:  Orthopedic Surgery Why:  If symptoms worsen, For suture  removal, For wound re-check Contact information: [address] [place] [address] [id number]            Signed: Valinda Hoar 12/17/2015, 1:07 PM

## 2015-12-24 DIAGNOSIS — M4150 Other secondary scoliosis, site unspecified: Secondary | ICD-10-CM | POA: Diagnosis not present

## 2015-12-24 DIAGNOSIS — Z4789 Encounter for other orthopedic aftercare: Secondary | ICD-10-CM | POA: Diagnosis not present

## 2015-12-24 DIAGNOSIS — Z9889 Other specified postprocedural states: Secondary | ICD-10-CM | POA: Diagnosis not present

## 2015-12-30 DIAGNOSIS — M4857XA Collapsed vertebra, not elsewhere classified, lumbosacral region, initial encounter for fracture: Secondary | ICD-10-CM | POA: Diagnosis not present

## 2015-12-30 DIAGNOSIS — M810 Age-related osteoporosis without current pathological fracture: Secondary | ICD-10-CM | POA: Diagnosis not present

## 2016-01-03 DIAGNOSIS — M545 Low back pain: Secondary | ICD-10-CM | POA: Diagnosis not present

## 2016-01-03 DIAGNOSIS — M5136 Other intervertebral disc degeneration, lumbar region: Secondary | ICD-10-CM | POA: Diagnosis not present

## 2016-01-03 DIAGNOSIS — M5137 Other intervertebral disc degeneration, lumbosacral region: Secondary | ICD-10-CM | POA: Diagnosis not present

## 2016-01-06 DIAGNOSIS — C562 Malignant neoplasm of left ovary: Secondary | ICD-10-CM | POA: Diagnosis not present

## 2016-01-21 DIAGNOSIS — S32020D Wedge compression fracture of second lumbar vertebra, subsequent encounter for fracture with routine healing: Secondary | ICD-10-CM | POA: Diagnosis not present

## 2016-01-21 DIAGNOSIS — Z4789 Encounter for other orthopedic aftercare: Secondary | ICD-10-CM | POA: Diagnosis not present

## 2016-01-23 DIAGNOSIS — Z90722 Acquired absence of ovaries, bilateral: Secondary | ICD-10-CM | POA: Diagnosis not present

## 2016-01-23 DIAGNOSIS — C569 Malignant neoplasm of unspecified ovary: Secondary | ICD-10-CM | POA: Diagnosis not present

## 2016-01-23 DIAGNOSIS — Z90710 Acquired absence of both cervix and uterus: Secondary | ICD-10-CM | POA: Diagnosis not present

## 2016-01-23 DIAGNOSIS — M549 Dorsalgia, unspecified: Secondary | ICD-10-CM | POA: Diagnosis not present

## 2016-01-23 DIAGNOSIS — E785 Hyperlipidemia, unspecified: Secondary | ICD-10-CM | POA: Diagnosis not present

## 2016-01-23 DIAGNOSIS — Z9079 Acquired absence of other genital organ(s): Secondary | ICD-10-CM | POA: Diagnosis not present

## 2016-01-23 DIAGNOSIS — Z9221 Personal history of antineoplastic chemotherapy: Secondary | ICD-10-CM | POA: Diagnosis not present

## 2016-01-23 DIAGNOSIS — Z1371 Encounter for nonprocreative screening for genetic disease carrier status: Secondary | ICD-10-CM | POA: Diagnosis not present

## 2016-01-23 DIAGNOSIS — F329 Major depressive disorder, single episode, unspecified: Secondary | ICD-10-CM | POA: Diagnosis not present

## 2016-01-24 DIAGNOSIS — Z23 Encounter for immunization: Secondary | ICD-10-CM | POA: Diagnosis not present

## 2016-01-28 DIAGNOSIS — R21 Rash and other nonspecific skin eruption: Secondary | ICD-10-CM | POA: Diagnosis not present

## 2016-01-28 DIAGNOSIS — A932 Colorado tick fever: Secondary | ICD-10-CM | POA: Diagnosis not present

## 2016-02-04 DIAGNOSIS — M810 Age-related osteoporosis without current pathological fracture: Secondary | ICD-10-CM | POA: Diagnosis not present

## 2016-02-13 DIAGNOSIS — E782 Mixed hyperlipidemia: Secondary | ICD-10-CM | POA: Diagnosis not present

## 2016-02-13 DIAGNOSIS — I890 Lymphedema, not elsewhere classified: Secondary | ICD-10-CM | POA: Diagnosis not present

## 2016-02-13 DIAGNOSIS — C569 Malignant neoplasm of unspecified ovary: Secondary | ICD-10-CM | POA: Diagnosis not present

## 2016-02-13 DIAGNOSIS — K219 Gastro-esophageal reflux disease without esophagitis: Secondary | ICD-10-CM | POA: Diagnosis not present

## 2016-02-13 DIAGNOSIS — N182 Chronic kidney disease, stage 2 (mild): Secondary | ICD-10-CM | POA: Diagnosis not present

## 2016-02-13 DIAGNOSIS — F411 Generalized anxiety disorder: Secondary | ICD-10-CM | POA: Diagnosis not present

## 2016-02-13 DIAGNOSIS — M1612 Unilateral primary osteoarthritis, left hip: Secondary | ICD-10-CM | POA: Diagnosis not present

## 2016-02-13 DIAGNOSIS — F324 Major depressive disorder, single episode, in partial remission: Secondary | ICD-10-CM | POA: Diagnosis not present

## 2016-02-18 DIAGNOSIS — Z4789 Encounter for other orthopedic aftercare: Secondary | ICD-10-CM | POA: Diagnosis not present

## 2016-02-18 DIAGNOSIS — S32020D Wedge compression fracture of second lumbar vertebra, subsequent encounter for fracture with routine healing: Secondary | ICD-10-CM | POA: Diagnosis not present

## 2016-02-23 DIAGNOSIS — G44209 Tension-type headache, unspecified, not intractable: Secondary | ICD-10-CM | POA: Diagnosis not present

## 2016-02-23 DIAGNOSIS — I10 Essential (primary) hypertension: Secondary | ICD-10-CM | POA: Diagnosis not present

## 2016-02-29 DIAGNOSIS — R51 Headache: Secondary | ICD-10-CM | POA: Diagnosis not present

## 2016-02-29 DIAGNOSIS — I10 Essential (primary) hypertension: Secondary | ICD-10-CM | POA: Diagnosis not present

## 2016-03-02 DIAGNOSIS — Z90722 Acquired absence of ovaries, bilateral: Secondary | ICD-10-CM | POA: Diagnosis not present

## 2016-03-02 DIAGNOSIS — Z9079 Acquired absence of other genital organ(s): Secondary | ICD-10-CM | POA: Diagnosis not present

## 2016-03-02 DIAGNOSIS — Z90710 Acquired absence of both cervix and uterus: Secondary | ICD-10-CM | POA: Diagnosis not present

## 2016-03-02 DIAGNOSIS — M549 Dorsalgia, unspecified: Secondary | ICD-10-CM | POA: Diagnosis not present

## 2016-03-02 DIAGNOSIS — Z5181 Encounter for therapeutic drug level monitoring: Secondary | ICD-10-CM | POA: Diagnosis not present

## 2016-03-02 DIAGNOSIS — Z8543 Personal history of malignant neoplasm of ovary: Secondary | ICD-10-CM | POA: Diagnosis not present

## 2016-03-02 DIAGNOSIS — Z79811 Long term (current) use of aromatase inhibitors: Secondary | ICD-10-CM | POA: Diagnosis not present

## 2016-03-02 DIAGNOSIS — Z85828 Personal history of other malignant neoplasm of skin: Secondary | ICD-10-CM | POA: Diagnosis not present

## 2016-03-02 DIAGNOSIS — E785 Hyperlipidemia, unspecified: Secondary | ICD-10-CM | POA: Diagnosis not present

## 2016-03-02 DIAGNOSIS — C569 Malignant neoplasm of unspecified ovary: Secondary | ICD-10-CM | POA: Diagnosis not present

## 2016-03-02 DIAGNOSIS — Z08 Encounter for follow-up examination after completed treatment for malignant neoplasm: Secondary | ICD-10-CM | POA: Diagnosis not present

## 2016-03-03 DIAGNOSIS — I10 Essential (primary) hypertension: Secondary | ICD-10-CM | POA: Diagnosis not present

## 2016-03-06 ENCOUNTER — Encounter (HOSPITAL_COMMUNITY): Payer: Self-pay | Admitting: Nurse Practitioner

## 2016-03-06 ENCOUNTER — Emergency Department (HOSPITAL_COMMUNITY): Payer: Medicare Other

## 2016-03-06 ENCOUNTER — Emergency Department (HOSPITAL_COMMUNITY)
Admission: EM | Admit: 2016-03-06 | Discharge: 2016-03-07 | Disposition: A | Discharge status: Home / Self Care | Payer: Medicare Other | Attending: Emergency Medicine | Admitting: Emergency Medicine

## 2016-03-06 DIAGNOSIS — Y999 Unspecified external cause status: Secondary | ICD-10-CM | POA: Insufficient documentation

## 2016-03-06 DIAGNOSIS — Z7982 Long term (current) use of aspirin: Secondary | ICD-10-CM | POA: Diagnosis not present

## 2016-03-06 DIAGNOSIS — Z87891 Personal history of nicotine dependence: Secondary | ICD-10-CM | POA: Insufficient documentation

## 2016-03-06 DIAGNOSIS — R51 Headache: Secondary | ICD-10-CM | POA: Insufficient documentation

## 2016-03-06 DIAGNOSIS — Y939 Activity, unspecified: Secondary | ICD-10-CM | POA: Diagnosis not present

## 2016-03-06 DIAGNOSIS — Y929 Unspecified place or not applicable: Secondary | ICD-10-CM | POA: Insufficient documentation

## 2016-03-06 DIAGNOSIS — W19XXXA Unspecified fall, initial encounter: Secondary | ICD-10-CM | POA: Diagnosis not present

## 2016-03-06 DIAGNOSIS — Z8543 Personal history of malignant neoplasm of ovary: Secondary | ICD-10-CM | POA: Insufficient documentation

## 2016-03-06 DIAGNOSIS — R4182 Altered mental status, unspecified: Secondary | ICD-10-CM | POA: Diagnosis not present

## 2016-03-06 DIAGNOSIS — J449 Chronic obstructive pulmonary disease, unspecified: Secondary | ICD-10-CM | POA: Diagnosis not present

## 2016-03-06 DIAGNOSIS — R296 Repeated falls: Secondary | ICD-10-CM | POA: Diagnosis not present

## 2016-03-06 LAB — CBC
HCT: 33.3 % — ABNORMAL LOW (ref 36.0–46.0)
HEMOGLOBIN: 10.7 g/dL — AB (ref 12.0–15.0)
MCH: 27.2 pg (ref 26.0–34.0)
MCHC: 32.1 g/dL (ref 30.0–36.0)
MCV: 84.7 fL (ref 78.0–100.0)
PLATELETS: 341 10*3/uL (ref 150–400)
RBC: 3.93 MIL/uL (ref 3.87–5.11)
RDW: 14.9 % (ref 11.5–15.5)
WBC: 6.6 10*3/uL (ref 4.0–10.5)

## 2016-03-06 LAB — I-STAT TROPONIN, ED: Troponin i, poc: 0 ng/mL (ref 0.00–0.08)

## 2016-03-06 LAB — COMPREHENSIVE METABOLIC PANEL
ALBUMIN: 3.4 g/dL — AB (ref 3.5–5.0)
ALK PHOS: 92 U/L (ref 38–126)
ALT: 12 U/L — AB (ref 14–54)
ANION GAP: 9 (ref 5–15)
AST: 20 U/L (ref 15–41)
BILIRUBIN TOTAL: 0.3 mg/dL (ref 0.3–1.2)
BUN: 15 mg/dL (ref 6–20)
CALCIUM: 9.4 mg/dL (ref 8.9–10.3)
CO2: 28 mmol/L (ref 22–32)
CREATININE: 1 mg/dL (ref 0.44–1.00)
Chloride: 102 mmol/L (ref 101–111)
GFR calc Af Amer: >60 mL/min (ref >60)
GFR calc non Af Amer: 57 mL/min — ABNORMAL LOW (ref >60)
GLUCOSE: 105 mg/dL — AB (ref 65–99)
Potassium: 3.3 mmol/L — ABNORMAL LOW (ref 3.5–5.1)
Sodium: 139 mmol/L (ref 135–145)
TOTAL PROTEIN: 6.8 g/dL (ref 6.5–8.1)

## 2016-03-06 LAB — CBG MONITORING, ED: GLUCOSE-CAPILLARY: 93 mg/dL (ref 65–99)

## 2016-03-06 MED ORDER — GADOBENATE DIMEGLUMINE 529 MG/ML IV SOLN
15.0000 mL | Freq: Once | INTRAVENOUS | Status: AC | PRN
  Administered 2016-03-06: 15 mL via INTRAVENOUS

## 2016-03-06 NOTE — ED Triage Notes (Signed)
Pt presents with chief complaint of falls. She was brought in by her daughter who is concerned for several day history of increasing confusion and falls. She has fallen three times today. The pt states she hit her head this morning when she fell. she denies any pain. She denies fevers, bowel or bladder changes. She states she does not feel like herself. The pt has been under treatment this year for cancer and was recently taken off some of her pain medications due to increasing confusion when taking the medications. The pt did take tramadol today for pain, which she started last week. She is alert, confused to some details, breathing easily.

## 2016-03-06 NOTE — ED Provider Notes (Signed)
[facility] DEPT Provider Note   CSN: [id number] Arrival date & time: 03/06/16  1444     History   Chief Complaint Chief Complaint  Patient presents with  . Altered Mental Status    HPI [name] is a 70 y.o. female.  Patient is a 70 year old female with history significant for ovarian cancer currently on oral chemotherapy, left lower extremity lymphedema, and prior gait disturbance and falls after certain chemotherapy meds presenting today with 2-3 day history of multiple recurrent falls. She states she is not sure what causes her to fall but she seems to be leaning more to the right. She states she always has some difficulty walking due to the significant lymphedema in her left leg but the falls have been more frequent in the last few days. She does recall recently starting an oral chemotherapy medication approximately 2 weeks ago and because she was straining to get severe headaches and feeling bad she stopped it on Friday but the falls have worsened. She does state she's had gait disturbance, headache and falls with certain other chemotherapy meds as well. She denies any new cough, congestion, fever or urinary symptoms. No swelling or redness in the left lower leg. She denies any chest pain.  She also states that occasionally she feels slightly confused but it is not persistent and she does not currently feel confused now.   The history is provided by the patient.  Altered Mental Status      Past Medical History:  Diagnosis Date  . Anxiety   . Cancer [facility])    '10-Ovarian cancer(tx. surgery, chemotherapy) -residual lymph edema lt. leg,restart of chemotherapy this week-Dr. Verne Spurr)  . COPD (chronic obstructive pulmonary disease) (HCC)    mild  . Depression   . GERD (gastroesophageal reflux disease)   . Heart murmur   . History of hiatal hernia   . Lymph edema 03-10-13   left leg( hip to foot)-consistent-remains an issue 10-11-14-wears compression hose    . Mitral valve prolapse 03-10-13   rare palpitations  . Nodule of neck 08-21-13   Chemotherapy to start this week for this(10-11-14 was told a yr ago- couldn't feel anything after chemo tx.)  . Pneumonia   . Portacath in place 08-21-13   left chest remains as of 10-11-14  . Seizures ([place])    '70- x1 post Phenergan IV for nausea during pregnancy  . Shortness of breath dyspnea   . Squamous cell skin cancer    left leg- one area recently excised, anther area is planned  . Transfusion history    during chemotherapy, none recent  . Wrist fracture    11'14-casted only,no surgery-no problems now    Patient Active Problem List   Diagnosis Date Noted  . L2 vertebral fracture ([place]) 12/11/2015  . Hypokalemia 09/07/2013  . SBO (small bowel obstruction) 09/05/2013  . Ovarian ca ([place]) 09/05/2013  . Dyslipidemia 09/05/2013  . Depression   . Anxiety   . GERD (gastroesophageal reflux disease)     Past Surgical History:  Procedure Laterality Date  . ABDOMINAL HYSTERECTOMY     '79-no cancer  . ANGIOPLASTY     NO ANGIOPLASTY-just had cardiac cath in 2006 (normal coronaries) 12/06/15  . APPENDECTOMY     '10  with Ovarian staging surgery  . CARDIAC CATHETERIZATION     normal coronaries, normal LVF 2006 (Dr. Fransico Him)  . CATARACT EXTRACTION, BILATERAL Bilateral   . COLONOSCOPY WITH PROPOFOL N/A 10/18/2014   Procedure: COLONOSCOPY WITH PROPOFOL;  Surgeon: Garlan Fair, MD;  Location: [facility];  Service: Endoscopy;  Laterality: N/A;  . FRACTURE SURGERY Right    '12 -ORIF Rt. shoulder  . KYPHOPLASTY N/A 12/11/2015   Procedure: L2 KYPHOPLASTY;  Surgeon: Melina Schools, MD;  Location: [facility];  Service: Orthopedics;  Laterality: N/A;  . LAPAROTOMY FOR STAGING / RESTAGING     '10- [facility] (Dr.Nycum)-Dr. Pippitt, oncology- surgery Ovarian Cancer  . PORTACATH PLACEMENT     has left chest pac  . TUBAL LIGATION      OB History    No data available       Home Medications    Prior to  Admission medications   Medication Sig Start Date End Date Taking? Authorizing Provider  acetaminophen (TYLENOL) 500 MG tablet Take 500 mg by mouth every 6 (six) hours as needed for mild pain.   Yes Historical Provider, MD  amLODipine (NORVASC) 5 MG tablet Take 5 mg by mouth daily.   Yes Historical Provider, MD  aspirin 81 MG tablet Take 81 mg by mouth daily.   Yes Historical Provider, MD  buPROPion (WELLBUTRIN XL) 300 MG 24 hr tablet Take 450 mg by mouth every morning.    Yes Historical Provider, MD  DULoxetine (CYMBALTA) 60 MG capsule Take 60 mg by mouth at bedtime.   Yes Historical Provider, MD  letrozole (FEMARA) 2.5 MG tablet Take 2.5 mg by mouth daily.   Yes Historical Provider, MD  LORazepam (ATIVAN) 1 MG tablet Take 1 mg by mouth 2 (two) times daily as needed for anxiety.   Yes Historical Provider, MD  Multiple Vitamin (MULTIVITAMIN WITH MINERALS) TABS tablet Take 1 tablet by mouth daily.   Yes Historical Provider, MD  ondansetron (ZOFRAN) 4 MG tablet Take 1 tablet (4 mg total) by mouth every 8 (eight) hours as needed for nausea or vomiting. 12/11/15  Yes Melina Schools, MD  pantoprazole (PROTONIX) 40 MG tablet Take 40 mg by mouth 2 (two) times daily.   Yes Historical Provider, MD  traMADol (ULTRAM) 50 MG tablet Take 50 mg by mouth 2 (two) times daily.   Yes Historical Provider, MD  HYDROcodone-acetaminophen (NORCO) 10-325 MG tablet Take 1 tablet by mouth every 6 (six) hours as needed. Patient not taking: Reported on 03/06/2016 12/11/15   Melina Schools, MD    Family History History reviewed. No pertinent family history.  Social History Social History  Substance Use Topics  . Smoking status: Former Smoker    Packs/day: 1.50    Years: 13.00    Types: Cigarettes    Quit date: 03/10/1986  . Smokeless tobacco: Never Used  . Alcohol use No     Allergies   Carboplatin; Phenergan [promethazine]; Morphine and related; Benadryl [diphenhydramine]; Clindamycin/lincomycin; Penicillins;  Prochlorperazine; and Sulfa antibiotics   Review of Systems Review of Systems  All other systems reviewed and are negative.    Physical Exam Updated Vital Signs BP 142/58   Pulse 88   Temp 98.7 F (37.1 C) (Oral)   Resp 14   SpO2 99%   Physical Exam  Constitutional: She is oriented to person, place, and time. She appears well-developed and well-nourished. No distress.  HENT:  Head: Normocephalic and atraumatic.  Mouth/Throat: Oropharynx is clear and moist.  Eyes: Conjunctivae and EOM are normal. Pupils are equal, round, and reactive to light.  Neck: Normal range of motion. Neck supple.  Cardiovascular: Normal rate, regular rhythm and intact distal pulses.   No murmur heard. Pulmonary/Chest: Effort normal and breath sounds normal.  No respiratory distress. She has no wheezes. She has no rales.  Abdominal: Soft. She exhibits no distension. There is no tenderness. There is no rebound and no guarding.  Musculoskeletal: Normal range of motion. She exhibits edema. She exhibits no tenderness.  Significant nonpitting edema of the entire left lower extremity. It is approximately 2 times the size of the right lower extremity  Neurological: She is alert and oriented to person, place, and time. She has normal strength. No cranial nerve deficit or sensory deficit. Coordination normal.  Normal finger to nose testing. Heel-to-shin within normal limits. No pronator drift. Gait is slow and shuffling but no ataxia noted  Skin: Skin is warm and dry. No rash noted. No erythema.  Psychiatric: She has a normal mood and affect. Her behavior is normal.  Nursing note and vitals reviewed.    ED Treatments / Results  Labs (all labs ordered are listed, but only abnormal results are displayed) Labs Reviewed  COMPREHENSIVE METABOLIC PANEL - Abnormal; Notable for the following:       Result Value   Potassium 3.3 (*)    Glucose, Bld 105 (*)    Albumin 3.4 (*)    ALT 12 (*)    GFR calc non Af Amer 57  (*)    All other components within normal limits  CBC - Abnormal; Notable for the following:    Hemoglobin 10.7 (*)    HCT 33.3 (*)    All other components within normal limits  CBG MONITORING, ED  I-STAT TROPOININ, ED    EKG  EKG Interpretation  Date/Time:  Friday March 06 2016 18:54:44 EDT Ventricular Rate:  90 PR Interval:    QRS Duration: 96 QT Interval:  392 QTC Calculation: 480 R Axis:   58 Text Interpretation:  Sinus rhythm Consider left atrial enlargement Probable left ventricular hypertrophy No significant change since last tracing Confirmed by Maryan Rued  MD, Loree Fee ([id number]) on 03/06/2016 7:12:57 PM       Radiology Ct Head Wo Contrast  Result Date: 03/06/2016 CLINICAL DATA:  Increasing falls and confusion.  Gait problems. EXAM: CT HEAD WITHOUT CONTRAST TECHNIQUE: Contiguous axial images were obtained from the base of the skull through the vertex without intravenous contrast. COMPARISON:  None. FINDINGS: Brain: There is a heterogeneously calcified 1.4 x 0.9 cm mass along the planum sphenoidale (series 5/image 29), favor extra-axial mass, most likely a meningioma, with minimal local mass effect on the frontal lobes. No evidence of parenchymal hemorrhage or extra-axial fluid collection. No additional mass lesion or midline shift. No CT evidence of acute cortical infarction. Small low-attenuation foci in the bilateral thalami and left caudate head. Intracranial atherosclerosis. Nonspecific mild subcortical and periventricular white matter hypodensity, most in keeping with chronic small vessel ischemic change. Cerebral volume is age appropriate. No ventriculomegaly. Vascular: No hyperdense vessel. Skull: No evidence of calvarial fracture. Sinuses/Orbits: The visualized paranasal sinuses are essentially clear. Other:  The mastoid air cells are unopacified. IMPRESSION: 1. Small low-attenuation foci in the bilateral thalami and left caudate head, which may represent lacunar infarcts of  indeterminate chronicity. 2. Extra-axial calcified 1.4 cm mass along the planum sphenoidale, most consistent with a meningioma. 3. Recommend further evaluation of these findings with MRI brain without and with IV contrast. 4. Mild chronic small vessel ischemia. Electronically Signed   By: Ilona Sorrel M.D.   On: 03/06/2016 20:36   Mr Jeri Cos And Wo Contrast  Result Date: 03/06/2016 CLINICAL DATA:  Initial evaluation for increase in falls with new  confusion. EXAM: MRI HEAD WITHOUT AND WITH CONTRAST TECHNIQUE: Multiplanar, multiecho pulse sequences of the brain and surrounding structures were obtained without and with intravenous contrast. CONTRAST:  28mL MULTIHANCE GADOBENATE DIMEGLUMINE 529 MG/ML IV SOLN COMPARISON:  Prior head CT from earlier the same day. FINDINGS: Brain: Study mildly degraded by motion artifact. Generalized age-related cerebral atrophy present. Patchy T2/FLAIR hyperintensity within the periventricular and deep white matter both cerebral hemispheres most compatible with chronic microvascular ischemic disease, moderate in nature. Small remote lacunar infarct present within the left basal ganglia. No other remote infarcts identified. No abnormal foci of restricted diffusion to suggest acute or subacute ischemia. Gray-white matter differentiation maintained. No acute or chronic intracranial hemorrhage. No other areas of chronic infarction identified. Well-circumscribed enhancing extra-axial mass positioned along the planum sphenoidale measuring 11 mm is most compatible with a a meningioma (series 16, image 12). No associated edema. No other mass lesion or abnormal enhancement. No mass effect or midline shift. No hydrocephalus. No extra-axial fluid collection. Major dural sinuses are grossly patent. Vascular: Major intracranial vascular flow voids are well preserved and normal. Skull and upper cervical spine: Craniocervical junction within normal limits. Scattered degenerative spondylolysis noted  within the upper cervical spine without significant stenosis. Bone marrow signal intensity within normal limits. No scalp soft tissue abnormality. Sinuses/Orbits: Globes and orbital soft tissues demonstrate no acute abnormality. Patient is status post lens extraction bilaterally. Mild scattered mucosal thickening within the ethmoidal air cells. Paranasal sinuses are otherwise clear. No mastoid effusion. Inner ear structures within normal limits. IMPRESSION: 1. No acute intracranial process. 2. Small remote lacunar infarct within the left caudate head. 3. 11 mm meningioma along the planum sphenoidale without associated edema. 4. Moderate chronic microvascular ischemic disease. Electronically Signed   By: Jeannine Boga M.D.   On: 03/06/2016 23:50    Procedures Procedures (including critical care time)  Medications Ordered in ED Medications - No data to display   Initial Impression / Assessment and Plan / ED Course  I have reviewed the triage vital signs and the nursing notes.  Pertinent labs & imaging results that were available during my care of the patient were reviewed by me and considered in my medical decision making (see chart for details).  Clinical Course   Patient is a 70 year old female with a history of ovarian cancer presenting with multiple falls in the last few days. Patient is currently on chemotherapy but recently had change to an oral medication due to intolerance from IV medications. She has had intermittent issues with falls and gait trouble for some time but they seemed to be worse in the last few days. She is attributing this possibly to her new chemotherapy medication. She stopped it on Friday but symptoms have not improved. She denies any systemic illness or fever. She recently did start amlodipine but blood pressure is been more controlled. She does have intermittent headaches but denies current headache. She also feels that she is intermittently confused but denies any  current confusion. On exam patient has a slow shuffling gait but no noted ataxia. She has normal coordination and no obvious signs of stroke however given patient's history concern for potential stroke. CT today shows low attenuation in the bilateral thalamus and left caudate head which may be infarcts as well as a 1.4 cm mass most likely related to a meningioma and recommended further MRI of the brain with and without contrast.  Labs without signs of hyponatremia, hypokalemia, renal dysfunction and EKG and troponin within normal limits.  MRI pending  12:13 AM MRI without acute process and will d/c home.  Final Clinical Impressions(s) / ED Diagnoses   Final diagnoses:  Fall, initial encounter    New Prescriptions New Prescriptions   No medications on file     Blanchie Dessert, MD 03/07/16 0018

## 2016-03-07 NOTE — ED Notes (Signed)
Pt stable, understands discharge instructions, and reasons for return.   

## 2016-03-23 DIAGNOSIS — S32020D Wedge compression fracture of second lumbar vertebra, subsequent encounter for fracture with routine healing: Secondary | ICD-10-CM | POA: Diagnosis not present

## 2016-03-23 DIAGNOSIS — M7060 Trochanteric bursitis, unspecified hip: Secondary | ICD-10-CM | POA: Diagnosis not present

## 2016-03-23 DIAGNOSIS — Z4789 Encounter for other orthopedic aftercare: Secondary | ICD-10-CM | POA: Diagnosis not present

## 2016-03-23 DIAGNOSIS — M7062 Trochanteric bursitis, left hip: Secondary | ICD-10-CM | POA: Diagnosis not present

## 2016-04-05 ENCOUNTER — Emergency Department (HOSPITAL_COMMUNITY): Payer: Medicare Other

## 2016-04-05 ENCOUNTER — Encounter (HOSPITAL_COMMUNITY): Payer: Self-pay

## 2016-04-05 ENCOUNTER — Emergency Department (HOSPITAL_COMMUNITY)
Admission: EM | Admit: 2016-04-05 | Discharge: 2016-04-05 | Disposition: A | Discharge status: Home / Self Care | Payer: Medicare Other | Attending: Emergency Medicine | Admitting: Emergency Medicine

## 2016-04-05 DIAGNOSIS — E876 Hypokalemia: Secondary | ICD-10-CM | POA: Diagnosis not present

## 2016-04-05 DIAGNOSIS — W19XXXA Unspecified fall, initial encounter: Secondary | ICD-10-CM

## 2016-04-05 DIAGNOSIS — Y9389 Activity, other specified: Secondary | ICD-10-CM | POA: Insufficient documentation

## 2016-04-05 DIAGNOSIS — Z7982 Long term (current) use of aspirin: Secondary | ICD-10-CM | POA: Diagnosis not present

## 2016-04-05 DIAGNOSIS — S2232XA Fracture of one rib, left side, initial encounter for closed fracture: Secondary | ICD-10-CM | POA: Insufficient documentation

## 2016-04-05 DIAGNOSIS — J449 Chronic obstructive pulmonary disease, unspecified: Secondary | ICD-10-CM | POA: Insufficient documentation

## 2016-04-05 DIAGNOSIS — W0110XA Fall on same level from slipping, tripping and stumbling with subsequent striking against unspecified object, initial encounter: Secondary | ICD-10-CM | POA: Insufficient documentation

## 2016-04-05 DIAGNOSIS — Z8543 Personal history of malignant neoplasm of ovary: Secondary | ICD-10-CM | POA: Diagnosis not present

## 2016-04-05 DIAGNOSIS — Y92015 Private garage of single-family (private) house as the place of occurrence of the external cause: Secondary | ICD-10-CM | POA: Insufficient documentation

## 2016-04-05 DIAGNOSIS — Y999 Unspecified external cause status: Secondary | ICD-10-CM | POA: Diagnosis not present

## 2016-04-05 DIAGNOSIS — Z87891 Personal history of nicotine dependence: Secondary | ICD-10-CM | POA: Insufficient documentation

## 2016-04-05 DIAGNOSIS — R42 Dizziness and giddiness: Secondary | ICD-10-CM | POA: Diagnosis not present

## 2016-04-05 DIAGNOSIS — Z79899 Other long term (current) drug therapy: Secondary | ICD-10-CM | POA: Diagnosis not present

## 2016-04-05 DIAGNOSIS — S299XXA Unspecified injury of thorax, initial encounter: Secondary | ICD-10-CM | POA: Diagnosis present

## 2016-04-05 LAB — BASIC METABOLIC PANEL
ANION GAP: 7 (ref 5–15)
Anion gap: 10 (ref 5–15)
BUN: 11 mg/dL (ref 6–20)
BUN: 11 mg/dL (ref 6–20)
CHLORIDE: 96 mmol/L — AB (ref 101–111)
CO2: 25 mmol/L (ref 22–32)
CO2: 20 mmol/L — ABNORMAL LOW (ref 22–32)
Calcium: 8.7 mg/dL — ABNORMAL LOW (ref 8.9–10.3)
Calcium: 8.1 mg/dL — ABNORMAL LOW (ref 8.9–10.3)
Chloride: 102 mmol/L (ref 101–111)
Creatinine, Ser: 1 mg/dL (ref 0.44–1.00)
Creatinine, Ser: 0.94 mg/dL (ref 0.44–1.00)
GFR calc Af Amer: >60 mL/min (ref >60)
GFR calc Af Amer: >60 mL/min (ref >60)
GFR calc non Af Amer: 57 mL/min — ABNORMAL LOW (ref >60)
GLUCOSE: 112 mg/dL — AB (ref 65–99)
GLUCOSE: 116 mg/dL — AB (ref 65–99)
POTASSIUM: 2.7 mmol/L — AB (ref 3.5–5.1)
POTASSIUM: 6.2 mmol/L — AB (ref 3.5–5.1)
SODIUM: 131 mmol/L — AB (ref 135–145)
Sodium: 129 mmol/L — ABNORMAL LOW (ref 135–145)

## 2016-04-05 LAB — CBC WITH DIFFERENTIAL/PLATELET
Basophils Absolute: 0 10*3/uL (ref 0.0–0.1)
Basophils Relative: 1 %
Eosinophils Absolute: 0.1 10*3/uL (ref 0.0–0.7)
Eosinophils Relative: 3 %
HEMATOCRIT: 34.1 % — AB (ref 36.0–46.0)
HEMOGLOBIN: 10.8 g/dL — AB (ref 12.0–15.0)
LYMPHS ABS: 1 10*3/uL (ref 0.7–4.0)
LYMPHS PCT: 25 %
MCH: 26.5 pg (ref 26.0–34.0)
MCHC: 31.7 g/dL (ref 30.0–36.0)
MCV: 83.8 fL (ref 78.0–100.0)
Monocytes Absolute: 0.5 10*3/uL (ref 0.1–1.0)
Monocytes Relative: 12 %
NEUTROS PCT: 59 %
Neutro Abs: 2.5 10*3/uL (ref 1.7–7.7)
Platelets: 278 10*3/uL (ref 150–400)
RBC: 4.07 MIL/uL (ref 3.87–5.11)
RDW: 15.4 % (ref 11.5–15.5)
WBC: 4.2 10*3/uL (ref 4.0–10.5)

## 2016-04-05 LAB — URINALYSIS, ROUTINE W REFLEX MICROSCOPIC
Bilirubin Urine: NEGATIVE
GLUCOSE, UA: NEGATIVE mg/dL
HGB URINE DIPSTICK: NEGATIVE
Ketones, ur: NEGATIVE mg/dL
Leukocytes, UA: NEGATIVE
Nitrite: NEGATIVE
Protein, ur: NEGATIVE mg/dL
SPECIFIC GRAVITY, URINE: 1.022 (ref 1.005–1.030)
pH: 5.5 (ref 5.0–8.0)

## 2016-04-05 LAB — POTASSIUM: POTASSIUM: 3.4 mmol/L — AB (ref 3.5–5.1)

## 2016-04-05 LAB — MAGNESIUM: MAGNESIUM: 1.6 mg/dL — AB (ref 1.7–2.4)

## 2016-04-05 MED ORDER — ACETAMINOPHEN 500 MG PO TABS
1000.0000 mg | ORAL_TABLET | Freq: Once | ORAL | Status: AC
  Administered 2016-04-05: 1000 mg via ORAL
  Filled 2016-04-05: qty 2

## 2016-04-05 MED ORDER — ACETAMINOPHEN 325 MG PO TABS
650.0000 mg | ORAL_TABLET | Freq: Once | ORAL | Status: AC
  Administered 2016-04-05: 650 mg via ORAL
  Filled 2016-04-05: qty 2

## 2016-04-05 MED ORDER — SODIUM CHLORIDE 0.9 % IV SOLN
30.0000 meq | Freq: Once | INTRAVENOUS | Status: AC
  Administered 2016-04-05: 30 meq via INTRAVENOUS
  Filled 2016-04-05: qty 15

## 2016-04-05 MED ORDER — IBUPROFEN 200 MG PO TABS
600.0000 mg | ORAL_TABLET | Freq: Once | ORAL | Status: AC
  Administered 2016-04-05: 600 mg via ORAL
  Filled 2016-04-05: qty 1

## 2016-04-05 MED ORDER — POTASSIUM CHLORIDE CRYS ER 20 MEQ PO TBCR
40.0000 meq | EXTENDED_RELEASE_TABLET | Freq: Once | ORAL | Status: AC
  Administered 2016-04-05: 40 meq via ORAL
  Filled 2016-04-05: qty 2

## 2016-04-05 MED ORDER — MAGNESIUM SULFATE 2 GM/50ML IV SOLN
2.0000 g | Freq: Once | INTRAVENOUS | Status: AC
  Administered 2016-04-05: 2 g via INTRAVENOUS
  Filled 2016-04-05: qty 50

## 2016-04-05 NOTE — ED Notes (Signed)
Patient drove herself to ED

## 2016-04-05 NOTE — ED Notes (Signed)
Patient states she tripped and fell 2 days ago in her garage c/o pain in her left rib areal. States she has been falling more freq. Lately.

## 2016-04-05 NOTE — ED Notes (Signed)
EDP at bedside  

## 2016-04-05 NOTE — ED Triage Notes (Signed)
Patient states that she has been experiencing increased falls for some time. Currently receiving tx for ovarian cancer. Fell in garage on Friday and complains of left rib pain and pain with inspiration. No shortness of breath, no loc

## 2016-04-05 NOTE — ED Provider Notes (Signed)
[facility] DEPT Provider Note   CSN: [id number] Arrival date & time: 04/05/16  1036     History   Chief Complaint Chief Complaint  Patient presents with  . Fall    HPI [name] is a 70 y.o. female.  [name] is a 70 y.o. Female with a history of ovarian cancer who is currently on oral chemotherapy who presents to the ED after a trip and fall 3 days ago. Patient reports she was getting Christmas decorations down in her garage and she tripped over an object. She reports landing on the left side of her chest. She denies hitting her head or loss of consciousness. Now she complains of pain all the way on the left side of her body. She reports she has pain over the left side of her chest where there is some bruising. She denies any chest pain or shortness of breath. She denies pain with deep inspiration.  She reports her pain is worse with movement. She reported to RN in triage that she has had multiple falls. Later, she tells me she has not fallen in more than a month. She was seen about a month ago and had head CT and MRI that was unremarkable. She has not fallen since until this trip and fall 3 days ago.  She is being treated for ovarian cancer at [facility]. No treatment prior to arrival. Patient denies fevers, abdominal pain, nausea, vomiting, numbness, tingling, weakness, double vision, changes to her vision, neck pain, urinary symptoms, chest pain, shortness of breath or rashes.   The history is provided by the patient and medical records. No language interpreter was used.  Fall  Associated symptoms include headaches. Pertinent negatives include no chest pain, no abdominal pain and no shortness of breath.    Past Medical History:  Diagnosis Date  . Anxiety   . Cancer [facility])    '10-Ovarian cancer(tx. surgery, chemotherapy) -residual lymph edema lt. leg,restart of chemotherapy this week-Dr. Verne Spurr)  . COPD (chronic obstructive pulmonary disease)  (HCC)    mild  . Depression   . GERD (gastroesophageal reflux disease)   . Heart murmur   . History of hiatal hernia   . Lymph edema 03-10-13   left leg( hip to foot)-consistent-remains an issue 10-11-14-wears compression hose  . Mitral valve prolapse 03-10-13   rare palpitations  . Nodule of neck 08-21-13   Chemotherapy to start this week for this(10-11-14 was told a yr ago- couldn't feel anything after chemo tx.)  . Pneumonia   . Portacath in place 08-21-13   left chest remains as of 10-11-14  . Seizures ([place])    '70- x1 post Phenergan IV for nausea during pregnancy  . Shortness of breath dyspnea   . Squamous cell skin cancer    left leg- one area recently excised, anther area is planned  . Transfusion history    during chemotherapy, none recent  . Wrist fracture    11'14-casted only,no surgery-no problems now    Patient Active Problem List   Diagnosis Date Noted  . L2 vertebral fracture ([place]) 12/11/2015  . Hypokalemia 09/07/2013  . SBO (small bowel obstruction) 09/05/2013  . Ovarian ca ([place]) 09/05/2013  . Dyslipidemia 09/05/2013  . Depression   . Anxiety   . GERD (gastroesophageal reflux disease)     Past Surgical History:  Procedure Laterality Date  . ABDOMINAL HYSTERECTOMY     '79-no cancer  . ANGIOPLASTY     NO ANGIOPLASTY-just had cardiac cath  in 2006 (normal coronaries) 12/06/15  . APPENDECTOMY     '10  with Ovarian staging surgery  . CARDIAC CATHETERIZATION     normal coronaries, normal LVF 2006 (Dr. Fransico Him)  . CATARACT EXTRACTION, BILATERAL Bilateral   . COLONOSCOPY WITH PROPOFOL N/A 10/18/2014   Procedure: COLONOSCOPY WITH PROPOFOL;  Surgeon: Garlan Fair, MD;  Location: [facility];  Service: Endoscopy;  Laterality: N/A;  . FRACTURE SURGERY Right    '12 -ORIF Rt. shoulder  . KYPHOPLASTY N/A 12/11/2015   Procedure: L2 KYPHOPLASTY;  Surgeon: Melina Schools, MD;  Location: [facility];  Service: Orthopedics;  Laterality: N/A;  . LAPAROTOMY FOR STAGING /  RESTAGING     '10- [facility] (Dr.Nycum)-Dr. Pippitt, oncology- surgery Ovarian Cancer  . PORTACATH PLACEMENT     has left chest pac  . TUBAL LIGATION      OB History    No data available       Home Medications    Prior to Admission medications   Medication Sig Start Date End Date Taking? Authorizing Provider  acetaminophen (TYLENOL) 500 MG tablet Take 500 mg by mouth every 6 (six) hours as needed for mild pain.    Historical Provider, MD  amLODipine (NORVASC) 5 MG tablet Take 5 mg by mouth daily.    Historical Provider, MD  aspirin 81 MG tablet Take 81 mg by mouth daily.    Historical Provider, MD  buPROPion (WELLBUTRIN XL) 300 MG 24 hr tablet Take 450 mg by mouth every morning.     Historical Provider, MD  DULoxetine (CYMBALTA) 60 MG capsule Take 60 mg by mouth at bedtime.    Historical Provider, MD  HYDROcodone-acetaminophen (NORCO) 10-325 MG tablet Take 1 tablet by mouth every 6 (six) hours as needed. Patient not taking: Reported on 03/06/2016 12/11/15   Melina Schools, MD  letrozole [facility]) 2.5 MG tablet Take 2.5 mg by mouth daily.    Historical Provider, MD  LORazepam (ATIVAN) 1 MG tablet Take 1 mg by mouth 2 (two) times daily as needed for anxiety.    Historical Provider, MD  Multiple Vitamin (MULTIVITAMIN WITH MINERALS) TABS tablet Take 1 tablet by mouth daily.    Historical Provider, MD  ondansetron (ZOFRAN) 4 MG tablet Take 1 tablet (4 mg total) by mouth every 8 (eight) hours as needed for nausea or vomiting. 12/11/15   Melina Schools, MD  pantoprazole (PROTONIX) 40 MG tablet Take 40 mg by mouth 2 (two) times daily.    Historical Provider, MD  traMADol (ULTRAM) 50 MG tablet Take 50 mg by mouth 2 (two) times daily.    Historical Provider, MD    Family History No family history on file.  Social History Social History  Substance Use Topics  . Smoking status: Former Smoker    Packs/day: 1.50    Years: 13.00    Types: Cigarettes    Quit date: 03/10/1986  . Smokeless tobacco:  Never Used  . Alcohol use No     Allergies   Carboplatin; Phenergan [promethazine]; Morphine and related; Benadryl [diphenhydramine]; Clindamycin/lincomycin; Penicillins; Prochlorperazine; and Sulfa antibiotics   Review of Systems Review of Systems  Constitutional: Negative for chills and fever.  HENT: Negative for congestion and sore throat.   Eyes: Negative for visual disturbance.  Respiratory: Negative for cough and shortness of breath.   Cardiovascular: Negative for chest pain and palpitations.  Gastrointestinal: Negative for abdominal pain, diarrhea, nausea and vomiting.  Genitourinary: Negative for dysuria, flank pain, frequency and urgency.  Musculoskeletal: Positive for  arthralgias. Negative for back pain and neck pain.  Skin: Negative for rash.  Neurological: Positive for dizziness and headaches. Negative for syncope, weakness, light-headedness and numbness.     Physical Exam Updated Vital Signs BP 163/78   Pulse 88   Temp 98.6 F (37 C) (Oral)   Resp 16   Ht 5\' 4"  (1.626 m)   Wt 72.6 kg   SpO2 99%   BMI 27.46 kg/m   Physical Exam  Constitutional: She is oriented to person, place, and time. She appears well-developed and well-nourished. No distress.  Nontoxic appearing.  HENT:  Head: Normocephalic and atraumatic.  Right Ear: External ear normal.  Left Ear: External ear normal.  Mouth/Throat: Oropharynx is clear and moist.  No visible or palpated signs of head trauma.  Eyes: Conjunctivae and EOM are normal. Pupils are equal, round, and reactive to light. Right eye exhibits no discharge. Left eye exhibits no discharge.  Neck: Normal range of motion. Neck supple. No JVD present. No tracheal deviation present.  Cardiovascular: Normal rate, regular rhythm, normal heart sounds and intact distal pulses.  Exam reveals no gallop and no friction rub.   No murmur heard. Bilateral radial and dorsalis pedis pulses are intact.  Pulmonary/Chest: Effort normal and breath  sounds normal. No stridor. No respiratory distress. She has no wheezes. She has no rales.  Lungs clear to auscultation bilaterally. Symmetric chest expansion bilaterally. There is some left lower chest wall ecchymosis noted. No crepitus. No flail segment.  Abdominal: Soft. There is no tenderness. There is no guarding.  Abdomen is soft and nontender to palpation.  Musculoskeletal: Normal range of motion. She exhibits no edema or tenderness.  Chronic lymphedema noted to her left leg. No redness or warmth noted. Good strength of bilateral upper and lower extremities. No midline neck or back tenderness.  Lymphadenopathy:    She has no cervical adenopathy.  Neurological: She is alert and oriented to person, place, and time. No cranial nerve deficit. Coordination normal.  Patient is alert and oriented 3. Cranial nerves are intact. Speech is clear and coherent. EOMs are intact. Vision is grossly intact. Sensation is intact her bilateral upper and lower extremities. Normal gait. Finger to nose intact bilaterally.   Skin: Skin is warm and dry. Capillary refill takes less than 2 seconds. No rash noted. She is not diaphoretic. No erythema. No pallor.  Psychiatric: She has a normal mood and affect. Her behavior is normal.  Nursing note and vitals reviewed.    ED Treatments / Results  Labs (all labs ordered are listed, but only abnormal results are displayed) Labs Reviewed  CBC WITH DIFFERENTIAL/PLATELET - Abnormal; Notable for the following:       Result Value   Hemoglobin 10.8 (*)    HCT 34.1 (*)    All other components within normal limits  BASIC METABOLIC PANEL - Abnormal; Notable for the following:    Sodium 131 (*)    Potassium 2.7 (*)    Chloride 96 (*)    Glucose, Bld 112 (*)    Calcium 8.7 (*)    GFR calc non Af Amer 57 (*)    All other components within normal limits  MAGNESIUM - Abnormal; Notable for the following:    Magnesium 1.6 (*)    All other components within normal limits    URINALYSIS, ROUTINE W REFLEX MICROSCOPIC (NOT AT [facility])    EKG  EKG Interpretation None       Radiology Dg Ribs Unilateral W/chest Left  Result Date: 04/05/2016 CLINICAL DATA:  Fall 2 days ago, left anterior rib pain EXAM: LEFT RIBS AND CHEST - 3+ VIEW COMPARISON:  07/17/2010 FINDINGS: Three views left ribs submitted. No infiltrate or pulmonary edema. Mild thoracic levoscoliosis. Left subclavian Port-A-Cath with tip in upper SVC. Moderate size hiatal hernia with air-fluid level measures 7.4 cm. There is nondisplaced fracture of the left anterior ninth rib. No pneumothorax. IMPRESSION: Levoscoliosis of the thoracic spine. Moderate size hiatal hernia. Nondisplaced fracture of the left anterior ninth rib. No pneumothorax. Electronically Signed   By: Lahoma Crocker M.D.   On: 04/05/2016 12:34   Ct Head Wo Contrast  Result Date: 04/05/2016 CLINICAL DATA:  Fall 2 days ago with dizziness and leg weakness EXAM: CT HEAD WITHOUT CONTRAST TECHNIQUE: Contiguous axial images were obtained from the base of the skull through the vertex without intravenous contrast. COMPARISON:  03/06/2016 FINDINGS: Brain: Scattered chronic lacunar infarcts are again identified within the thalamus and left caudate nucleus. No findings to suggest acute hemorrhage or acute infarction are noted. A partially calcified meningioma is again noted in the planum sphenoidale. This is stable from the prior exam. Vascular: No hyperdense vessel or unexpected calcification. Skull: Normal. Negative for fracture or focal lesion. Sinuses/Orbits: No acute finding. Other: None. IMPRESSION: Chronic changes similar to that seen on the prior exam. No acute abnormality is noted. Electronically Signed   By: Inez Catalina M.D.   On: 04/05/2016 12:45    Procedures Procedures (including critical care time)  Medications Ordered in ED Medications  potassium chloride 30 mEq in sodium chloride 0.9 % 265 mL (KCL MULTIRUN) IVPB (30 mEq Intravenous Given  04/05/16 1442)  magnesium sulfate IVPB 2 g 50 mL (not administered)  acetaminophen (TYLENOL) tablet 650 mg (650 mg Oral Given 04/05/16 1324)  potassium chloride SA (K-DUR,KLOR-CON) CR tablet 40 mEq (40 mEq Oral Given 04/05/16 1431)     Initial Impression / Assessment and Plan / ED Course  I have reviewed the triage vital signs and the nursing notes.  Pertinent labs & imaging results that were available during my care of the patient were reviewed by me and considered in my medical decision making (see chart for details).  Clinical Course     This is a 70 y.o. Female with a history of ovarian cancer who is currently on oral chemotherapy who presents to the ED after a trip and fall 3 days ago. Patient reports she was getting Christmas decorations down in her garage and she tripped over an object. She reports landing on the left side of her chest. She denies hitting her head or loss of consciousness. Now she complains of pain all the way on the left side of her body. She reports she has pain over the left side of her chest where there is some bruising. She denies any chest pain or shortness of breath. She denies pain with deep inspiration.  She reports her pain is worse with movement. She reported to RN in triage that she has had multiple falls. Later, she tells me she has not fallen in more than a month. She was seen about a month ago and had head CT and MRI that was unremarkable. She has not fallen since until this trip and fall 3 days ago. On exam the patient is afebrile and nontoxic-appearing. She is no focal neurological deficits. She does have some mild tenderness across her left lateral chest wall where there is some slight ecchymosis. No crepitus. Symmetric chest expansion bilaterally. Lungs clear  to auscultation bilaterally. Patient ambulatory normal gait. Urinalysis without signs of infection. CBC is unremarkable. Hemoglobin around her baseline. BMP is remarkable for hypokalemia with potassium  of 2.7. The patient is not on diuretics. Will order potassium replacement. Magnesium is 1.6. Magnesium also ordered. CT head without acute findings.  Left ribs with chest x-ray shows a nondisplaced left anterior ninth rib fracture. No pneumothorax. No pulmonary edema. No infiltrate. Patient denies chest pain or shortness of breath. Will discharge with incentive spirometer.  Patient receiving magnesium and potassium at shift change. Patient care signed out to Dr. Noemi Chapel at shift change. Will recheck potassium and plan for discharge. She was encouraged to keep her appointment for follow-up with her neurologist.   This patient was discussed with Dr. Lita Mains who agrees with assessment and plan.     Final Clinical Impressions(s) / ED Diagnoses   Final diagnoses:  Fall, initial encounter  Closed fracture of one rib of left side, initial encounter  Hypokalemia  Hypomagnesemia    New Prescriptions New Prescriptions   No medications on file     Waynetta Pean, PA-C 04/05/16 1613    Noemi Chapel, MD 04/05/16 2208

## 2016-04-05 NOTE — ED Notes (Signed)
Phlebotomy at bedside.

## 2016-04-09 DIAGNOSIS — I10 Essential (primary) hypertension: Secondary | ICD-10-CM | POA: Diagnosis not present

## 2016-04-09 DIAGNOSIS — J069 Acute upper respiratory infection, unspecified: Secondary | ICD-10-CM | POA: Diagnosis not present

## 2016-04-20 ENCOUNTER — Other Ambulatory Visit: Payer: Self-pay | Admitting: Obstetrics and Gynecology

## 2016-04-20 DIAGNOSIS — Z1231 Encounter for screening mammogram for malignant neoplasm of breast: Secondary | ICD-10-CM

## 2016-04-27 ENCOUNTER — Ambulatory Visit (INDEPENDENT_AMBULATORY_CARE_PROVIDER_SITE_OTHER): Payer: Medicare Other | Admitting: Neurology

## 2016-04-27 ENCOUNTER — Encounter: Payer: Self-pay | Admitting: Neurology

## 2016-04-27 VITALS — BP 150/65 | HR 91 | Wt 176.0 lb

## 2016-04-27 DIAGNOSIS — Z789 Other specified health status: Secondary | ICD-10-CM

## 2016-04-27 DIAGNOSIS — R413 Other amnesia: Secondary | ICD-10-CM

## 2016-04-27 DIAGNOSIS — R269 Unspecified abnormalities of gait and mobility: Secondary | ICD-10-CM | POA: Diagnosis not present

## 2016-04-27 DIAGNOSIS — Z9114 Patient's other noncompliance with medication regimen: Secondary | ICD-10-CM

## 2016-04-27 DIAGNOSIS — E538 Deficiency of other specified B group vitamins: Secondary | ICD-10-CM | POA: Diagnosis not present

## 2016-04-27 DIAGNOSIS — I639 Cerebral infarction, unspecified: Secondary | ICD-10-CM | POA: Diagnosis not present

## 2016-04-27 DIAGNOSIS — I6381 Other cerebral infarction due to occlusion or stenosis of small artery: Secondary | ICD-10-CM

## 2016-04-27 DIAGNOSIS — R7309 Other abnormal glucose: Secondary | ICD-10-CM | POA: Diagnosis not present

## 2016-04-27 DIAGNOSIS — F05 Delirium due to known physiological condition: Secondary | ICD-10-CM | POA: Diagnosis not present

## 2016-04-27 DIAGNOSIS — R410 Disorientation, unspecified: Secondary | ICD-10-CM

## 2016-04-27 DIAGNOSIS — W19XXXA Unspecified fall, initial encounter: Secondary | ICD-10-CM

## 2016-04-27 NOTE — Progress Notes (Addendum)
[facility]    Provider:  Dr Jaynee Eagles Referring Provider: Shirline Frees, MD Primary Care Physician:  Shirline Frees, MD  CC:  confusion  HPI:  [name] is a 70 y.o. female here as a referral from Dr. Kenton Kingfisher for confusion. She has PMHx significant for ovarian cancer currently on oral chemotherapy, COPD, depression, gerd, MV prolapse, seizures, left lower extremity lymphedema, gait disturbance and falls after starting chemotherapy medication, anxiety, SBO, electrolyte abnormalities (hypoK, hypomag), lacunar infarct. She is here for confusion. Patient has been falling more frequently, she is taking more ativan than necessary.  She has fallen and she has had compression fractures. She has significant lymphedema in the left leg and she often can;t feel the left leg. She is having multiple falls, she has caught her left foot and broke a rib, she has had compression fractures and after kyphoplasty she has left radiculopathy and weakness of the left leg. She uses a walker (not using it today). She has not had physical therapy recently. Majority of falls have been at home, probably 10 of them. Falls started after chemotherapy, she had dizziness and weakness. She fell in her back yard and broke her wrist. She has had 10 falls in the last 6 months, the last a month ago. Some falls attributed to medications such as Dilaudid. Her husband died Oct 03, 2022. Daughter is here and provides information, they are reallt here for confusion which have been ongoing since her husband died 5 months ago, she was "loopy" for several months, she was on dilaudid for compression fracture and she fell out of the shower. She was taking Lorazepam 4-5 a day but only prescribed 3x a day and they would refill early anyway, she was on a sleeping medication as well. Her primary care tried to get her off of these medications. She only gets lorazepam at night now, daughter took over he medications, daughter only gives  it at night for the last 2 months but mother is taking it from other days because of left leg pain and her pain in the left leg. She is forgetting things, she forgets appointments, she forgets things her friends told her and forgets conversations and things she is supposed to do. She forgot the appointment today.  More short-term memory issues, long-term memory is fine. She was adopted so no idea if any alzheimers in the family. She is on multiple depression medications. Her mood is now fine. She stares off into space sometimes. Son with seizures.diffculty driving.  Daughter provides much information as well.   Reviewed notes, labs and imaging from outside physicians, which showed:  MRI brain 03/06/2016:  Personally reviewed images and agree with the following  IMPRESSION: 1. No acute intracranial process. 2. Small remote lacunar infarct within the left caudate head. 3. 11 mm meningioma along the planum sphenoidale without associated edema. 4. Moderate chronic microvascular ischemic disease.  Review of Systems: Patient complains of symptoms per HPI as well as the following symptoms: No CP, no SOB. Pertinent negatives per HPI. All others negative.   Social History   Social History  . Marital status: Married    Spouse name: N/A  . Number of children: N/A  . Years of education: N/A   Occupational History  . Not on file.   Social History Main Topics  . Smoking status: Former Smoker    Packs/day: 1.50    Years: 13.00    Types: Cigarettes    Quit date: 03/10/1986  . Smokeless tobacco: Never  Used  . Alcohol use No  . Drug use: No  . Sexual activity: Not on file   Other Topics Concern  . Not on file   Social History Narrative   Lives with [name] (daughter)   Caffeine use: Drinks 1 cup coffee per day   Tea- rare   Soda- coke (several 8oz glasses per day)    History reviewed. No pertinent family history.  Past Medical History:  Diagnosis Date  . Anxiety   . Cancer  [facility])    '10-Ovarian cancer(tx. surgery, chemotherapy) -residual lymph edema lt. leg,restart of chemotherapy this week-Dr. Verne Spurr)  . COPD (chronic obstructive pulmonary disease) (HCC)    mild  . Depression   . GERD (gastroesophageal reflux disease)   . Heart murmur   . History of hiatal hernia   . Lymph edema 03-10-13   left leg( hip to foot)-consistent-remains an issue 10-11-14-wears compression hose  . Mitral valve prolapse 03-10-13   rare palpitations  . Nodule of neck 08-21-13   Chemotherapy to start this week for this(10-11-14 was told a yr ago- couldn't feel anything after chemo tx.)  . Pneumonia   . Portacath in place 08-21-13   left chest remains as of 10-11-14  . Seizures ([place])    '70- x1 post Phenergan IV for nausea during pregnancy  . Shortness of breath dyspnea   . Squamous cell skin cancer    left leg- one area recently excised, anther area is planned  . Transfusion history    during chemotherapy, none recent  . Wrist fracture    11'14-casted only,no surgery-no problems now    Past Surgical History:  Procedure Laterality Date  . ABDOMINAL HYSTERECTOMY     '79-no cancer  . ANGIOPLASTY     NO ANGIOPLASTY-just had cardiac cath in 2006 (normal coronaries) 12/06/15  . APPENDECTOMY     '10  with Ovarian staging surgery  . CARDIAC CATHETERIZATION     normal coronaries, normal LVF 2006 (Dr. Fransico Him)  . CATARACT EXTRACTION, BILATERAL Bilateral   . COLONOSCOPY WITH PROPOFOL N/A 10/18/2014   Procedure: COLONOSCOPY WITH PROPOFOL;  Surgeon: Garlan Fair, MD;  Location: [facility];  Service: Endoscopy;  Laterality: N/A;  . FRACTURE SURGERY Right    '12 -ORIF Rt. shoulder  . KYPHOPLASTY N/A 12/11/2015   Procedure: L2 KYPHOPLASTY;  Surgeon: Melina Schools, MD;  Location: [facility];  Service: Orthopedics;  Laterality: N/A;  . LAPAROTOMY FOR STAGING / RESTAGING     '10- [facility] (Dr.Nycum)-Dr. Pippitt, oncology- surgery Ovarian Cancer  . PORTACATH PLACEMENT     has  left chest pac  . TUBAL LIGATION      Current Outpatient Prescriptions  Medication Sig Dispense Refill  . acetaminophen (TYLENOL) 500 MG tablet Take 500 mg by mouth every 6 (six) hours as needed for mild pain.    Marland Kitchen amLODipine (NORVASC) 5 MG tablet Take 5 mg by mouth daily.    Marland Kitchen aspirin 81 MG tablet Take 81 mg by mouth daily.    Marland Kitchen buPROPion (WELLBUTRIN XL) 300 MG 24 hr tablet Take 450 mg by mouth every morning.     . DULoxetine (CYMBALTA) 60 MG capsule Take 60 mg by mouth at bedtime.    Marland Kitchen letrozole (FEMARA) 2.5 MG tablet Take 2.5 mg by mouth daily.    Marland Kitchen LORazepam (ATIVAN) 1 MG tablet Take 1 mg by mouth 2 (two) times daily as needed for anxiety.    . Multiple Vitamin (MULTIVITAMIN WITH MINERALS) TABS tablet Take  1 tablet by mouth daily.    . ondansetron (ZOFRAN) 4 MG tablet Take 1 tablet (4 mg total) by mouth every 8 (eight) hours as needed for nausea or vomiting. 20 tablet 0  . pantoprazole (PROTONIX) 40 MG tablet Take 40 mg by mouth 2 (two) times daily.     No current facility-administered medications for this visit.     Allergies as of 04/27/2016 - Review Complete 04/05/2016  Allergen Reaction Noted  . Carboplatin Shortness Of Breath 03/01/2014  . Phenergan [promethazine] Other (See Comments) 03/01/2013  . Morphine and related Other (See Comments) 10/10/2014  . Benadryl [diphenhydramine] Anxiety 12/11/2015  . Clindamycin/lincomycin Nausea And Vomiting 03/11/2013  . Penicillins Rash 03/01/2013  . Prochlorperazine Other (See Comments) 03/11/2013  . Sulfa antibiotics Nausea And Vomiting 03/01/2013    Vitals: BP (!) 150/65 (BP Location: Right Arm, Patient Position: Sitting, Cuff Size: Normal)   Pulse 91   Wt 176 lb (79.8 kg)   SpO2 96%   BMI 30.21 kg/m  Last Weight:  Wt Readings from Last 1 Encounters:  04/27/16 176 lb (79.8 kg)   Last Height:   Ht Readings from Last 1 Encounters:  04/05/16 5\' 4"  (1.626 m)    Physical exam: Exam: Gen: NAD, conversant, well nourised,  obese, well groomed                     CV: RRR, no MRG. No Carotid Bruits. Left leg lymphedema Eyes: Conjunctivae clear without exudates or hemorrhage  Neuro: Detailed Neurologic Exam  Speech:    Speech is normal; fluent and spontaneous with normal comprehension.  Cognition:    The patient is oriented to person, place, and time;     recent and remote memory intact;     language fluent;     normal attention, concentration,     fund of knowledge Cranial Nerves:    The pupils are equal, round, and reactive to light. The fundi are normal and spontaneous venous pulsations are present. Visual fields are full to finger confrontation. Extraocular movements are intact. Trigeminal sensation is intact and the muscles of mastication are normal. The face is symmetric. The palate elevates in the midline. Hearing intact. Voice is normal. Shoulder shrug is normal. The tongue has normal motion without fasciculations.   Coordination:    No dysmetria  Gait:    antalgic  Motor Observation:    no involuntary movements noted. Tone:    Normal muscle tone.    Posture:    Posture is normal. normal erect    Strength:Left leg proxima weakness otherwise strength is V/V in the upper and lower limbs.      Sensation: intact to LT     Reflex Exam:  DTR's: Left Aj absent otherwise deep tendon reflexes in the upper and lower extremities are normal bilaterally.   Toes:    The toes are downgoing bilaterally.   Clonus:    Clonus is absent.   Assessment/Plan:  LILLAR SHUTTER is a 70 y.o. female here as a referral from Dr. Kenton Kingfisher for confusion. She has PMHx significant for ovarian cancer currently on oral chemotherapy, COPD, depression, gerd, MV prolapse, seizures, left lower extremity lymphedema, gait disturbance and falls after starting chemotherapy medication, anxiety, SBO, electrolyte abnormalities (hypoK, hypomag), lacunar infarct.  - Multiple falls, dizziness and weakness: NEEDS HOME PT and home  safety evaluation  - She needs a baby aspirin for stroke prevention, has not been taking it, discussed she needs to start daily. Hgba1c  and lipid panel. I had a long d/w patient about her recent stroke, risk for recurrent stroke/TIAs, personally independently reviewed imaging studies and stroke evaluation results and answered questions.Continue aspirin for secondary stroke prevention and maintain strict control of hypertension with blood pressure goal below 130/90, diabetes with hemoglobin A1c goal below 6.5% and lipids with LDL cholesterol goal below 70 mg/dL.  - EEG for staring spells and confusion  - Memory changes: After workup may need a cognitive evaluation. Also may need LP and other evaluation for paraneoplatic encephalopathy. Advised to stop all medications that can cause confusion, limit pain and anxiety medications such as benzos. Check tsh, rpr, b12, hgba1c. Lipid panel at next appointment when fasting. FDG Pet scan is a possibility in the future as well  - Fall risk, always use a walking aid  - Home nursing, she is overusing benzodiazepines needs nurse for medication help  - discussed safety, fall risk  Cc: Shirline Frees, MD  Sarina Ill, MD  [facility] [address] [place], [place] [address]  Phone [phone] Fax [phone]

## 2016-04-27 NOTE — Patient Instructions (Addendum)
Remember to drink plenty of fluid, eat healthy meals and do not skip any meals. Try to eat protein with a every meal and eat a healthy snack such as fruit or nuts in between meals. Try to keep a regular sleep-wake schedule and try to exercise daily, particularly in the form of walking, 20-30 minutes a day, if you can.   As far as your medications are concerned, I would like to suggest: Aspirin daily  As far as diagnostic testing: Labs, EEG and formal neurocognitive testing, home PT  I would like to see you back in 3 months, sooner if we need to. Please call us with any interim questions, concerns, problems, updates or refill requests.   Our phone number is [phone]. We also have an after hours call service for urgent matters and there is a physician on-call for urgent questions. For any emergencies you know to call 911 or go to the nearest emergency room

## 2016-04-28 LAB — HEMOGLOBIN A1C
ESTIMATED AVERAGE GLUCOSE: 117 mg/dL
HEMOGLOBIN A1C: 5.7 % — AB (ref 4.8–5.6)

## 2016-04-28 LAB — THYROID PANEL WITH TSH
FREE THYROXINE INDEX: 1.5 (ref 1.2–4.9)
T3 UPTAKE RATIO: 26 % (ref 24–39)
T4, Total: 5.7 ug/dL (ref 4.5–12.0)
TSH: 2.73 u[IU]/mL (ref 0.450–4.500)

## 2016-04-28 LAB — B12 AND FOLATE PANEL
Folate: 7.7 ng/mL (ref >3.0)
Vitamin B-12: 338 pg/mL (ref 232–1245)

## 2016-04-28 LAB — RPR: RPR Ser Ql: NONREACTIVE

## 2016-04-29 ENCOUNTER — Encounter: Payer: Self-pay | Admitting: Neurology

## 2016-04-30 ENCOUNTER — Telehealth: Payer: Self-pay | Admitting: *Deleted

## 2016-04-30 NOTE — Telephone Encounter (Signed)
LVM for pt to call about results. Gave [facility] phone number.  

## 2016-04-30 NOTE — Telephone Encounter (Signed)
-----   Message from Melvenia Beam, MD sent at 04/30/2016 11:52 AM EST ----- Labs are all essentially normal. Hgba1c is slightly elevated at 5.7 but nothing concerning watch diet and weight. B12 is normal but on the lower end so I would suggest daily B12 1021mcg or a multivitamin with some B12 in it as b12 is essential for memory. Thanks

## 2016-05-06 DIAGNOSIS — E785 Hyperlipidemia, unspecified: Secondary | ICD-10-CM | POA: Diagnosis not present

## 2016-05-06 DIAGNOSIS — M549 Dorsalgia, unspecified: Secondary | ICD-10-CM | POA: Diagnosis not present

## 2016-05-06 DIAGNOSIS — C569 Malignant neoplasm of unspecified ovary: Secondary | ICD-10-CM | POA: Diagnosis not present

## 2016-05-06 DIAGNOSIS — Z85828 Personal history of other malignant neoplasm of skin: Secondary | ICD-10-CM | POA: Diagnosis not present

## 2016-05-06 DIAGNOSIS — Z79811 Long term (current) use of aromatase inhibitors: Secondary | ICD-10-CM | POA: Diagnosis not present

## 2016-05-07 DIAGNOSIS — Z9079 Acquired absence of other genital organ(s): Secondary | ICD-10-CM | POA: Diagnosis not present

## 2016-05-07 DIAGNOSIS — Z9889 Other specified postprocedural states: Secondary | ICD-10-CM | POA: Diagnosis not present

## 2016-05-07 DIAGNOSIS — Z85828 Personal history of other malignant neoplasm of skin: Secondary | ICD-10-CM | POA: Diagnosis not present

## 2016-05-07 DIAGNOSIS — M549 Dorsalgia, unspecified: Secondary | ICD-10-CM | POA: Diagnosis not present

## 2016-05-07 DIAGNOSIS — Z90722 Acquired absence of ovaries, bilateral: Secondary | ICD-10-CM | POA: Diagnosis not present

## 2016-05-07 DIAGNOSIS — Z79811 Long term (current) use of aromatase inhibitors: Secondary | ICD-10-CM | POA: Diagnosis not present

## 2016-05-07 DIAGNOSIS — C569 Malignant neoplasm of unspecified ovary: Secondary | ICD-10-CM | POA: Diagnosis not present

## 2016-05-07 DIAGNOSIS — Z5181 Encounter for therapeutic drug level monitoring: Secondary | ICD-10-CM | POA: Diagnosis not present

## 2016-05-07 DIAGNOSIS — E785 Hyperlipidemia, unspecified: Secondary | ICD-10-CM | POA: Diagnosis not present

## 2016-05-07 DIAGNOSIS — Z90710 Acquired absence of both cervix and uterus: Secondary | ICD-10-CM | POA: Diagnosis not present

## 2016-05-07 NOTE — Telephone Encounter (Signed)
Called and spoke with pt about lab results per AA,MD note. She verbalized understanding. She will start taking B12 1051mcg daily. Added this to pt med list. Pt has no further questions at this time.

## 2016-05-08 DIAGNOSIS — I890 Lymphedema, not elsewhere classified: Secondary | ICD-10-CM | POA: Diagnosis not present

## 2016-05-08 DIAGNOSIS — J449 Chronic obstructive pulmonary disease, unspecified: Secondary | ICD-10-CM | POA: Diagnosis not present

## 2016-05-08 DIAGNOSIS — R296 Repeated falls: Secondary | ICD-10-CM | POA: Diagnosis not present

## 2016-05-08 DIAGNOSIS — C569 Malignant neoplasm of unspecified ovary: Secondary | ICD-10-CM | POA: Diagnosis not present

## 2016-05-15 DIAGNOSIS — I890 Lymphedema, not elsewhere classified: Secondary | ICD-10-CM | POA: Diagnosis not present

## 2016-05-15 DIAGNOSIS — R296 Repeated falls: Secondary | ICD-10-CM | POA: Diagnosis not present

## 2016-05-22 DIAGNOSIS — M5136 Other intervertebral disc degeneration, lumbar region: Secondary | ICD-10-CM | POA: Diagnosis not present

## 2016-05-22 DIAGNOSIS — M5416 Radiculopathy, lumbar region: Secondary | ICD-10-CM | POA: Diagnosis not present

## 2016-05-26 DIAGNOSIS — I890 Lymphedema, not elsewhere classified: Secondary | ICD-10-CM | POA: Diagnosis not present

## 2016-05-26 DIAGNOSIS — R296 Repeated falls: Secondary | ICD-10-CM | POA: Diagnosis not present

## 2016-06-02 DIAGNOSIS — I890 Lymphedema, not elsewhere classified: Secondary | ICD-10-CM | POA: Diagnosis not present

## 2016-06-02 DIAGNOSIS — R296 Repeated falls: Secondary | ICD-10-CM | POA: Diagnosis not present

## 2016-06-03 ENCOUNTER — Ambulatory Visit: Payer: Medicare Other

## 2016-06-04 DIAGNOSIS — R296 Repeated falls: Secondary | ICD-10-CM | POA: Diagnosis not present

## 2016-06-04 DIAGNOSIS — I890 Lymphedema, not elsewhere classified: Secondary | ICD-10-CM | POA: Diagnosis not present

## 2016-06-05 ENCOUNTER — Other Ambulatory Visit: Payer: Medicare Other

## 2016-06-15 ENCOUNTER — Emergency Department (HOSPITAL_COMMUNITY)
Admission: EM | Admit: 2016-06-15 | Discharge: 2016-06-15 | Disposition: A | Discharge status: Home / Self Care | Payer: Medicare Other | Attending: Emergency Medicine | Admitting: Emergency Medicine

## 2016-06-15 ENCOUNTER — Emergency Department (HOSPITAL_COMMUNITY): Payer: Medicare Other

## 2016-06-15 ENCOUNTER — Encounter (HOSPITAL_COMMUNITY): Payer: Self-pay | Admitting: Emergency Medicine

## 2016-06-15 DIAGNOSIS — R1907 Generalized intra-abdominal and pelvic swelling, mass and lump: Secondary | ICD-10-CM | POA: Diagnosis not present

## 2016-06-15 DIAGNOSIS — Z7982 Long term (current) use of aspirin: Secondary | ICD-10-CM | POA: Insufficient documentation

## 2016-06-15 DIAGNOSIS — R1030 Lower abdominal pain, unspecified: Secondary | ICD-10-CM | POA: Diagnosis not present

## 2016-06-15 DIAGNOSIS — Z87891 Personal history of nicotine dependence: Secondary | ICD-10-CM | POA: Insufficient documentation

## 2016-06-15 DIAGNOSIS — R1900 Intra-abdominal and pelvic swelling, mass and lump, unspecified site: Secondary | ICD-10-CM | POA: Insufficient documentation

## 2016-06-15 DIAGNOSIS — Z79899 Other long term (current) drug therapy: Secondary | ICD-10-CM | POA: Diagnosis not present

## 2016-06-15 DIAGNOSIS — K5909 Other constipation: Secondary | ICD-10-CM | POA: Insufficient documentation

## 2016-06-15 DIAGNOSIS — I10 Essential (primary) hypertension: Secondary | ICD-10-CM | POA: Insufficient documentation

## 2016-06-15 DIAGNOSIS — J449 Chronic obstructive pulmonary disease, unspecified: Secondary | ICD-10-CM | POA: Insufficient documentation

## 2016-06-15 DIAGNOSIS — R109 Unspecified abdominal pain: Secondary | ICD-10-CM | POA: Diagnosis present

## 2016-06-15 HISTORY — DX: Essential (primary) hypertension: I10

## 2016-06-15 LAB — CBC
HCT: 36 % (ref 36.0–46.0)
Hemoglobin: 11.3 g/dL — ABNORMAL LOW (ref 12.0–15.0)
MCH: 26.2 pg (ref 26.0–34.0)
MCHC: 31.4 g/dL (ref 30.0–36.0)
MCV: 83.3 fL (ref 78.0–100.0)
Platelets: 360 10*3/uL (ref 150–400)
RBC: 4.32 MIL/uL (ref 3.87–5.11)
RDW: 16 % — AB (ref 11.5–15.5)
WBC: 8 10*3/uL (ref 4.0–10.5)

## 2016-06-15 LAB — COMPREHENSIVE METABOLIC PANEL
ALBUMIN: 3.6 g/dL (ref 3.5–5.0)
ALK PHOS: 136 U/L — AB (ref 38–126)
ALT: 15 U/L (ref 14–54)
ANION GAP: 11 (ref 5–15)
AST: 22 U/L (ref 15–41)
BILIRUBIN TOTAL: 0.5 mg/dL (ref 0.3–1.2)
BUN: 11 mg/dL (ref 6–20)
CALCIUM: 9.9 mg/dL (ref 8.9–10.3)
CO2: 29 mmol/L (ref 22–32)
Chloride: 99 mmol/L — ABNORMAL LOW (ref 101–111)
Creatinine, Ser: 1.09 mg/dL — ABNORMAL HIGH (ref 0.44–1.00)
GFR calc Af Amer: 59 mL/min — ABNORMAL LOW (ref >60)
GFR, EST NON AFRICAN AMERICAN: 51 mL/min — AB (ref >60)
GLUCOSE: 137 mg/dL — AB (ref 65–99)
Potassium: 3.6 mmol/L (ref 3.5–5.1)
Sodium: 139 mmol/L (ref 135–145)
TOTAL PROTEIN: 7.3 g/dL (ref 6.5–8.1)

## 2016-06-15 LAB — LIPASE, BLOOD: Lipase: 35 U/L (ref 11–51)

## 2016-06-15 MED ORDER — LORAZEPAM 2 MG/ML IJ SOLN
1.0000 mg | Freq: Once | INTRAMUSCULAR | Status: AC
  Administered 2016-06-15: 1 mg via INTRAVENOUS
  Filled 2016-06-15: qty 1

## 2016-06-15 MED ORDER — IOPAMIDOL (ISOVUE-300) INJECTION 61%
INTRAVENOUS | Status: AC
  Administered 2016-06-15: 100 mL
  Filled 2016-06-15: qty 100

## 2016-06-15 MED ORDER — ONDANSETRON HCL 4 MG/2ML IJ SOLN
4.0000 mg | Freq: Once | INTRAMUSCULAR | Status: AC
  Administered 2016-06-15: 4 mg via INTRAVENOUS
  Filled 2016-06-15: qty 2

## 2016-06-15 NOTE — ED Triage Notes (Addendum)
Patient c/o abd cramping, constipation, bloating, nausea for about a week. Pt is ovarian cancer patient, on chemo PO - has had intestinal blockage before and reports this feels about the same. Pt denies taking PO pain medication. States last BM was 7 days ago, typically goes every day - has tried everything OTC (Miralax, prune juice, enemas). Pt denies fevers but had small episode of chills last night. Reports her kidney fxn has been low lately and she may be dehydrated. Denies urinary symptoms.

## 2016-06-15 NOTE — ED Notes (Signed)
Pt ambulated to room from waiting room, tolerated well. Pt placed in gown and on monitor. 

## 2016-06-15 NOTE — ED Notes (Signed)
Patient transported to CT 

## 2016-06-15 NOTE — ED Provider Notes (Signed)
[facility] DEPT Provider Note   CSN: [id number] Arrival date & time: 06/15/16  1458     History   Chief Complaint Chief Complaint  Patient presents with  . Abdominal Pain    HPI [name] is a 71 y.o. female.  HPI 71 year old female with a history of ovarian cancer currently on oral chemotherapy presenting with abdominal pain, distention, constipation. She states that she has not had a normal bowel movement 7 days. She occasionally has a small watery bowel movement. She was passing gas until today that she does not think she has passed any gas today. She states that she feels that her abdomen is more distended than normal. She has had a bowel obstruction in the past and it felt similar to this. Endorses nausea but no vomiting. Denies rectal bleeding. Denies fever, chest pain, shortness of breath. She has tried MiraLAX, prune juice, multiple enemas with no success.  Past Medical History:  Diagnosis Date  . Anxiety   . Cancer [facility])    '10-Ovarian cancer(tx. surgery, chemotherapy) -residual lymph edema lt. leg,restart of chemotherapy this week-Dr. Verne Spurr)  . COPD (chronic obstructive pulmonary disease) (HCC)    mild  . Depression   . GERD (gastroesophageal reflux disease)   . Heart murmur   . History of hiatal hernia   . Hypertension   . Lymph edema 03-10-13   left leg( hip to foot)-consistent-remains an issue 10-11-14-wears compression hose  . Mitral valve prolapse 03-10-13   rare palpitations  . Nodule of neck 08-21-13   Chemotherapy to start this week for this(10-11-14 was told a yr ago- couldn't feel anything after chemo tx.)  . Pneumonia   . Portacath in place 08-21-13   left chest remains as of 10-11-14  . Seizures ([place])    '70- x1 post Phenergan IV for nausea during pregnancy  . Shortness of breath dyspnea   . Squamous cell skin cancer    left leg- one area recently excised, anther area is planned  . Transfusion history    during chemotherapy,  none recent  . Wrist fracture    11'14-casted only,no surgery-no problems now    Patient Active Problem List   Diagnosis Date Noted  . L2 vertebral fracture ([place]) 12/11/2015  . Hypokalemia 09/07/2013  . SBO (small bowel obstruction) 09/05/2013  . Ovarian ca ([place]) 09/05/2013  . Dyslipidemia 09/05/2013  . Depression   . Anxiety   . GERD (gastroesophageal reflux disease)     Past Surgical History:  Procedure Laterality Date  . ABDOMINAL HYSTERECTOMY     '79-no cancer  . ANGIOPLASTY     NO ANGIOPLASTY-just had cardiac cath in 2006 (normal coronaries) 12/06/15  . APPENDECTOMY     '10  with Ovarian staging surgery  . CARDIAC CATHETERIZATION     normal coronaries, normal LVF 2006 (Dr. Fransico Him)  . CATARACT EXTRACTION, BILATERAL Bilateral   . COLONOSCOPY WITH PROPOFOL N/A 10/18/2014   Procedure: COLONOSCOPY WITH PROPOFOL;  Surgeon: Garlan Fair, MD;  Location: [facility];  Service: Endoscopy;  Laterality: N/A;  . FRACTURE SURGERY Right    '12 -ORIF Rt. shoulder  . KYPHOPLASTY N/A 12/11/2015   Procedure: L2 KYPHOPLASTY;  Surgeon: Melina Schools, MD;  Location: [facility];  Service: Orthopedics;  Laterality: N/A;  . LAPAROTOMY FOR STAGING / RESTAGING     '10- [facility] (Dr.Nycum)-Dr. Pippitt, oncology- surgery Ovarian Cancer  . PORTACATH PLACEMENT     has left chest pac  . TUBAL LIGATION  OB History    No data available       Home Medications    Prior to Admission medications   Medication Sig Start Date End Date Taking? Authorizing Provider  albuterol (VENTOLIN HFA) 108 (90 Base) MCG/ACT inhaler Inhale 1-2 puffs into the lungs every 6 (six) hours as needed for wheezing or shortness of breath.   Yes Historical Provider, MD  amLODipine (NORVASC) 5 MG tablet Take 5 mg by mouth daily.   Yes Historical Provider, MD  aspirin 81 MG tablet Take 81 mg by mouth daily.    Yes Historical Provider, MD  buPROPion (WELLBUTRIN XL) 300 MG 24 hr tablet Take 450 mg by mouth every morning.     Yes Historical Provider, MD  Cholecalciferol (VITAMIN D-3 PO) Take 1 capsule by mouth daily.   Yes Historical Provider, MD  DULoxetine (CYMBALTA) 60 MG capsule Take 60 mg by mouth at bedtime.   Yes Historical Provider, MD  ibuprofen (ADVIL,MOTRIN) 200 MG tablet Take 800 mg by mouth every 6 (six) hours as needed (for back pain).   Yes Historical Provider, MD  letrozole (FEMARA) 2.5 MG tablet Take 2.5 mg by mouth daily.   Yes Historical Provider, MD  LORazepam (ATIVAN) 1 MG tablet Take 1 mg by mouth 2 (two) times daily.    Yes Historical Provider, MD  MAGNESIUM PO Take 1 tablet by mouth daily.   Yes Historical Provider, MD  ondansetron (ZOFRAN) 4 MG tablet Take 1 tablet (4 mg total) by mouth every 8 (eight) hours as needed for nausea or vomiting. 12/11/15  Yes Melina Schools, MD  pantoprazole (PROTONIX) 40 MG tablet Take 40 mg by mouth 2 (two) times daily.   Yes Historical Provider, MD  polyethylene glycol powder (GLYCOLAX/MIRALAX) powder Take 17-34 g by mouth daily.   Yes Historical Provider, MD  simvastatin (ZOCOR) 20 MG tablet Take 20 mg by mouth daily.   Yes Historical Provider, MD    Family History No family history on file.  Social History Social History  Substance Use Topics  . Smoking status: Former Smoker    Packs/day: 1.50    Years: 13.00    Types: Cigarettes    Quit date: 03/10/1986  . Smokeless tobacco: Never Used  . Alcohol use No     Allergies   Carboplatin; Phenergan [promethazine]; Morphine and related; Other; Benadryl [diphenhydramine]; Clindamycin/lincomycin; Penicillins; Prochlorperazine; and Sulfa antibiotics   Review of Systems Review of Systems  Constitutional: Negative for chills and fever.  HENT: Negative for ear pain and sore throat.   Eyes: Negative for pain and visual disturbance.  Respiratory: Negative for cough and shortness of breath.   Cardiovascular: Negative for chest pain and palpitations.  Gastrointestinal: Positive for abdominal distention,  abdominal pain, constipation and nausea. Negative for blood in stool and vomiting.  Genitourinary: Negative for dysuria and hematuria.  Musculoskeletal: Negative for arthralgias, back pain and neck pain.  Skin: Negative for color change and rash.  Neurological: Negative for seizures and syncope.  All other systems reviewed and are negative.    Physical Exam Updated Vital Signs BP 164/85 (BP Location: Left Arm)   Pulse 87   Temp 99.2 F (37.3 C) (Oral)   Resp 19   Ht 5' 4.5" (1.638 m)   Wt 79.4 kg   SpO2 97%   BMI 29.57 kg/m   Physical Exam  Constitutional: She appears well-developed and well-nourished. No distress.  HENT:  Head: Normocephalic and atraumatic.  Eyes: Conjunctivae are normal.  Neck: Normal range  of motion. Neck supple.  Cardiovascular: Normal rate and regular rhythm.   No murmur heard. Pulmonary/Chest: Effort normal and breath sounds normal. No respiratory distress.  Abdominal: Soft. She exhibits no distension. There is generalized tenderness (mild).  Genitourinary: Rectum normal. Rectal exam shows no mass and anal tone normal.  Musculoskeletal: She exhibits no edema.  Neurological: She is alert.  Skin: Skin is warm and dry.  Psychiatric: She has a normal mood and affect.  Nursing note and vitals reviewed.    ED Treatments / Results  Labs (all labs ordered are listed, but only abnormal results are displayed) Labs Reviewed  COMPREHENSIVE METABOLIC PANEL - Abnormal; Notable for the following:       Result Value   Chloride 99 (*)    Glucose, Bld 137 (*)    Creatinine, Ser 1.09 (*)    Alkaline Phosphatase 136 (*)    GFR calc non Af Amer 51 (*)    GFR calc Af Amer 59 (*)    All other components within normal limits  CBC - Abnormal; Notable for the following:    Hemoglobin 11.3 (*)    RDW 16.0 (*)    All other components within normal limits  LIPASE, BLOOD    EKG  EKG Interpretation None       Radiology Ct Abdomen Pelvis W  Contrast  Result Date: 06/15/2016 CLINICAL DATA:  Lower abdominal pain and constipation, history of ovarian cancer EXAM: CT ABDOMEN AND PELVIS WITH CONTRAST TECHNIQUE: Multidetector CT imaging of the abdomen and pelvis was performed using the standard protocol following bolus administration of intravenous contrast. CONTRAST:  183mL ISOVUE-300 IOPAMIDOL (ISOVUE-300) INJECTION 61% COMPARISON:  09/05/2013, 02/27/2014 FINDINGS: Lower chest: Lung bases demonstrate no acute infiltrate or pleural effusion. The heart is nonenlarged. Moderate para esophageal hiatal hernia. Hepatobiliary: Subcentimeter hypodensity in the anterior dome of the liver, too small to further characterize. No other discrete focal hepatic lesions are visualized. The gallbladder is contracted. No biliary dilatation. Pancreas: Unremarkable. No pancreatic ductal dilatation or surrounding inflammatory changes. Spleen: Normal in size without focal abnormality. Adrenals/Urinary Tract: Subcentimeter hypodense lesion mid left kidney, too small to further characterize. No hydronephrosis. No calcified stones. The bladder is unremarkable Stomach/Bowel: No dilated small bowel to suggest obstruction. No colon wall thickening. Postsurgical changes in the right pelvis. Vascular/Lymphatic: Atherosclerotic calcifications. No aneurysm. No enlarged abdominal or pelvic lymph nodes. Reproductive: Patient is status post hysterectomy. No adnexal masses Other: Right posterior pelvic, perirectal soft tissue mass measuring 3 x 3.2 by 4.2 cm, series 201, image number 65. Surgical clips in the left hemipelvis. Small fat containing umbilical hernia. No free air. No free fluid. Musculoskeletal: Retrolisthesis L2 on L3. Prior vertebral augmentation at L2, L4 and L5. Mild compression L1. IMPRESSION: 1. No CT evidence for a bowel obstruction. 2. 4.2 cm soft tissue mass in the right posterior pelvis adjacent to the rectum suspicious for metastatic disease. 3. Moderate hiatal hernia  Electronically Signed   By: Donavan Foil M.D.   On: 06/15/2016 21:07    Procedures Procedures (including critical care time)  Medications Ordered in ED Medications  ondansetron (ZOFRAN) injection 4 mg (4 mg Intravenous Given 06/15/16 2001)  LORazepam (ATIVAN) injection 1 mg (1 mg Intravenous Given 06/15/16 2015)  iopamidol (ISOVUE-300) 61 % injection (100 mLs  Contrast Given 06/15/16 2039)     Initial Impression / Assessment and Plan / ED Course  I have reviewed the triage vital signs and the nursing notes.  Pertinent labs & imaging results that  were available during my care of the patient were reviewed by me and considered in my medical decision making (see chart for details).    71 year old female presenting with constipation and abdominal pain. She has been told that she has a small mass in her pelvis and she is having a past and she feels like she is having another obstruction. No obvious distention on exam. No stool ball and the rectal vault and no obvious mass palpated. Labs are reassuring. CT ordered for concern for bowel obstruction. CT shows a soft tissue mass in the right posterior pelvis adjacent to the rectum that is suspicious for metastatic disease. No CT evidence for bowel extraction. Although the mass is likely causing her difficulty passing stool she does not have evidence of a bowel obstruction at this time.  Instructed her to continue supportive management at home with stool softeners and enemas and to call her oncologist first thing in the morning to be seen as soon as possible due to the possible metastatic mass in her pelvis. As she is not completely distracted did not believe that she warrants inpatient admission at this time however she was given strict return precautions to return if she is unable to pass stool, liquids, gas through the rectum or if her abdominal distention or pain worsens. She is amenable with this plan and will call her oncologist in the morning.    Patient care discussed and supervised by my attending, Dr. Ashok Cordia. Drucie Ip, MD   Final Clinical Impressions(s) / ED Diagnoses   Final diagnoses:  Pelvic mass  Other constipation    New Prescriptions Discharge Medication List as of 06/15/2016 10:01 PM       Bria Sparr [place] Meka Lewan, MD 06/16/16 [id number]    Lajean Saver, MD 06/16/16 1154

## 2016-06-15 NOTE — Discharge Instructions (Signed)
Your CT abdomin/pelvis showed a Right posterior pelvic, perirectal soft tissue mass measuring 3 x 3.2 by 4.2 cm, series 201, image number 65. Please call your oncologist tomorrow and inform her of the CT results and follow up with her at the next available appointment. Please return to the ED if you are unable to pass stool, liquid, or gas from your rectum, or if your abdominal pain worsens or have have intractable vomiting.

## 2016-06-17 DIAGNOSIS — E782 Mixed hyperlipidemia: Secondary | ICD-10-CM | POA: Diagnosis not present

## 2016-06-17 DIAGNOSIS — I10 Essential (primary) hypertension: Secondary | ICD-10-CM | POA: Diagnosis not present

## 2016-06-17 DIAGNOSIS — F411 Generalized anxiety disorder: Secondary | ICD-10-CM | POA: Diagnosis not present

## 2016-06-17 DIAGNOSIS — N182 Chronic kidney disease, stage 2 (mild): Secondary | ICD-10-CM | POA: Diagnosis not present

## 2016-06-17 DIAGNOSIS — F324 Major depressive disorder, single episode, in partial remission: Secondary | ICD-10-CM | POA: Diagnosis not present

## 2016-06-17 DIAGNOSIS — M5440 Lumbago with sciatica, unspecified side: Secondary | ICD-10-CM | POA: Diagnosis not present

## 2016-06-17 DIAGNOSIS — I890 Lymphedema, not elsewhere classified: Secondary | ICD-10-CM | POA: Diagnosis not present

## 2016-06-17 DIAGNOSIS — K219 Gastro-esophageal reflux disease without esophagitis: Secondary | ICD-10-CM | POA: Diagnosis not present

## 2016-06-17 DIAGNOSIS — G8929 Other chronic pain: Secondary | ICD-10-CM | POA: Diagnosis not present

## 2016-06-17 DIAGNOSIS — C569 Malignant neoplasm of unspecified ovary: Secondary | ICD-10-CM | POA: Diagnosis not present

## 2016-06-22 ENCOUNTER — Other Ambulatory Visit: Payer: Medicare Other

## 2016-06-22 DIAGNOSIS — M4150 Other secondary scoliosis, site unspecified: Secondary | ICD-10-CM | POA: Diagnosis not present

## 2016-06-22 DIAGNOSIS — S32020D Wedge compression fracture of second lumbar vertebra, subsequent encounter for fracture with routine healing: Secondary | ICD-10-CM | POA: Diagnosis not present

## 2016-06-23 ENCOUNTER — Encounter: Payer: Self-pay | Admitting: Neurology

## 2016-06-29 ENCOUNTER — Ambulatory Visit
Admission: RE | Admit: 2016-06-29 | Discharge: 2016-06-29 | Disposition: A | Discharge status: Home / Self Care | Payer: Medicare Other | Source: Ambulatory Visit | Attending: Obstetrics and Gynecology | Admitting: Obstetrics and Gynecology

## 2016-06-29 DIAGNOSIS — Z1231 Encounter for screening mammogram for malignant neoplasm of breast: Secondary | ICD-10-CM

## 2016-06-29 HISTORY — DX: Malignant neoplasm of unspecified ovary: C56.9

## 2016-06-30 ENCOUNTER — Ambulatory Visit (INDEPENDENT_AMBULATORY_CARE_PROVIDER_SITE_OTHER): Payer: Medicare Other | Admitting: Neurology

## 2016-06-30 DIAGNOSIS — R410 Disorientation, unspecified: Secondary | ICD-10-CM

## 2016-06-30 DIAGNOSIS — F05 Delirium due to known physiological condition: Principal | ICD-10-CM

## 2016-07-01 NOTE — Procedures (Signed)
    History:  [name] is a 71 year old patient with a history of ovarian cancer. The patient is being evaluated for confusion. She has a history of seizures. The patient has also been falling more frequently. The confusion began about 5 months prior to this evaluation. The patient is being evaluated for this issue.  This is a routine EEG. No skull defects are noted. Medications include Norvasc, Wellbutrin, Cymbalta, Femara, Ativan, multivitamins, Zofran, and Protonix.   EEG classification: Normal awake and drowsy  Description of the recording: The background rhythms of this recording consists of a fairly well modulated medium amplitude alpha rhythm of 9 Hz that is reactive to eye opening and closure. As the record progresses, the patient appears to remain in the waking state throughout the recording. Photic stimulation was performed, resulting in a bilateral and symmetric photic driving response. Hyperventilation was also performed, resulting in a minimal buildup of the background rhythm activities without significant slowing seen. Toward the end of the recording, the patient enters the drowsy state with slight symmetric slowing seen. The patient never enters stage II sleep. At no time during the recording does there appear to be evidence of spike or spike wave discharges or evidence of focal slowing. Electrode artifact at the [facility] and T4 electrodes were seen intermittently during the recording. Head movement artifact was also present. EKG monitor shows no evidence of cardiac rhythm abnormalities with a heart rate of 90.  Impression: This is a normal EEG recording in the waking and drowsy state. No evidence of ictal or interictal discharges are seen.

## 2016-07-02 ENCOUNTER — Telehealth: Payer: Self-pay

## 2016-07-02 NOTE — Telephone Encounter (Signed)
Called w/ normal EEG results and pt voiced understanding. Says that she is at least 75% better since seeing Dr. Jaynee Eagles (in Dec). Reports that speech problems have completely resolved. Only has an occasional day when she notices mild unsteadiness but overall symptoms have improved immensely. Agreed to call back w/ any questions/concerns prior to her follow-up appt scheduled next month. Verbalized appreciation for call and neurological care.

## 2016-07-02 NOTE — Telephone Encounter (Signed)
-----   Message from Melvenia Beam, MD sent at 07/02/2016 11:04 AM EST ----- EEG was normal. Can you call and speak with them and see how she is doind? If she is improving that is great. If she is not improving I want to do more workup including lumbar puncture and other labs to look for other reasons including paraneoplastic causes of her encephalopathy(ovarian cancer can be associated with antibodies that can cause encephalopathy, we would order an LP and more labs). Let me know thanks

## 2016-07-08 DIAGNOSIS — C569 Malignant neoplasm of unspecified ovary: Secondary | ICD-10-CM | POA: Diagnosis not present

## 2016-07-10 DIAGNOSIS — E785 Hyperlipidemia, unspecified: Secondary | ICD-10-CM | POA: Diagnosis not present

## 2016-07-10 DIAGNOSIS — C569 Malignant neoplasm of unspecified ovary: Secondary | ICD-10-CM | POA: Diagnosis not present

## 2016-07-10 DIAGNOSIS — Z5111 Encounter for antineoplastic chemotherapy: Secondary | ICD-10-CM | POA: Diagnosis not present

## 2016-07-13 DIAGNOSIS — Z90722 Acquired absence of ovaries, bilateral: Secondary | ICD-10-CM | POA: Diagnosis not present

## 2016-07-13 DIAGNOSIS — Z79811 Long term (current) use of aromatase inhibitors: Secondary | ICD-10-CM | POA: Diagnosis not present

## 2016-07-13 DIAGNOSIS — Z90710 Acquired absence of both cervix and uterus: Secondary | ICD-10-CM | POA: Diagnosis not present

## 2016-07-13 DIAGNOSIS — Z5181 Encounter for therapeutic drug level monitoring: Secondary | ICD-10-CM | POA: Diagnosis not present

## 2016-07-13 DIAGNOSIS — Z9079 Acquired absence of other genital organ(s): Secondary | ICD-10-CM | POA: Diagnosis not present

## 2016-07-13 DIAGNOSIS — L989 Disorder of the skin and subcutaneous tissue, unspecified: Secondary | ICD-10-CM | POA: Diagnosis not present

## 2016-07-13 DIAGNOSIS — C569 Malignant neoplasm of unspecified ovary: Secondary | ICD-10-CM | POA: Diagnosis not present

## 2016-07-22 DIAGNOSIS — Z85828 Personal history of other malignant neoplasm of skin: Secondary | ICD-10-CM | POA: Diagnosis not present

## 2016-07-22 DIAGNOSIS — L905 Scar conditions and fibrosis of skin: Secondary | ICD-10-CM | POA: Diagnosis not present

## 2016-07-27 ENCOUNTER — Telehealth: Payer: Self-pay

## 2016-07-27 ENCOUNTER — Ambulatory Visit: Payer: Medicare Other | Admitting: Neurology

## 2016-07-27 NOTE — Telephone Encounter (Signed)
Pt no-showed her appt this morning. 

## 2016-07-29 ENCOUNTER — Encounter: Payer: Self-pay | Admitting: Neurology

## 2016-07-29 DIAGNOSIS — I10 Essential (primary) hypertension: Secondary | ICD-10-CM | POA: Diagnosis not present

## 2016-07-29 DIAGNOSIS — K219 Gastro-esophageal reflux disease without esophagitis: Secondary | ICD-10-CM | POA: Diagnosis not present

## 2016-07-29 DIAGNOSIS — N182 Chronic kidney disease, stage 2 (mild): Secondary | ICD-10-CM | POA: Diagnosis not present

## 2016-07-30 DIAGNOSIS — B338 Other specified viral diseases: Secondary | ICD-10-CM | POA: Diagnosis not present

## 2016-07-30 DIAGNOSIS — R05 Cough: Secondary | ICD-10-CM | POA: Diagnosis not present

## 2016-07-30 DIAGNOSIS — J028 Acute pharyngitis due to other specified organisms: Secondary | ICD-10-CM | POA: Diagnosis not present

## 2016-08-06 DIAGNOSIS — Z90710 Acquired absence of both cervix and uterus: Secondary | ICD-10-CM | POA: Diagnosis not present

## 2016-08-06 DIAGNOSIS — Z9079 Acquired absence of other genital organ(s): Secondary | ICD-10-CM | POA: Diagnosis not present

## 2016-08-06 DIAGNOSIS — Z90722 Acquired absence of ovaries, bilateral: Secondary | ICD-10-CM | POA: Diagnosis not present

## 2016-08-06 DIAGNOSIS — Z5111 Encounter for antineoplastic chemotherapy: Secondary | ICD-10-CM | POA: Diagnosis not present

## 2016-08-06 DIAGNOSIS — E785 Hyperlipidemia, unspecified: Secondary | ICD-10-CM | POA: Diagnosis not present

## 2016-08-06 DIAGNOSIS — C569 Malignant neoplasm of unspecified ovary: Secondary | ICD-10-CM | POA: Diagnosis not present

## 2016-08-06 DIAGNOSIS — Z1371 Encounter for nonprocreative screening for genetic disease carrier status: Secondary | ICD-10-CM | POA: Diagnosis not present

## 2016-08-13 DIAGNOSIS — I10 Essential (primary) hypertension: Secondary | ICD-10-CM | POA: Diagnosis not present

## 2016-08-13 DIAGNOSIS — F411 Generalized anxiety disorder: Secondary | ICD-10-CM | POA: Diagnosis not present

## 2016-08-13 DIAGNOSIS — N182 Chronic kidney disease, stage 2 (mild): Secondary | ICD-10-CM | POA: Diagnosis not present

## 2016-08-13 DIAGNOSIS — C773 Secondary and unspecified malignant neoplasm of axilla and upper limb lymph nodes: Secondary | ICD-10-CM | POA: Diagnosis not present

## 2016-08-13 DIAGNOSIS — C569 Malignant neoplasm of unspecified ovary: Secondary | ICD-10-CM | POA: Diagnosis not present

## 2016-08-13 DIAGNOSIS — E782 Mixed hyperlipidemia: Secondary | ICD-10-CM | POA: Diagnosis not present

## 2016-08-13 DIAGNOSIS — F324 Major depressive disorder, single episode, in partial remission: Secondary | ICD-10-CM | POA: Diagnosis not present

## 2016-08-25 DIAGNOSIS — Z5111 Encounter for antineoplastic chemotherapy: Secondary | ICD-10-CM | POA: Diagnosis not present

## 2016-08-25 DIAGNOSIS — C569 Malignant neoplasm of unspecified ovary: Secondary | ICD-10-CM | POA: Diagnosis not present

## 2016-08-26 DIAGNOSIS — Z5111 Encounter for antineoplastic chemotherapy: Secondary | ICD-10-CM | POA: Diagnosis not present

## 2016-08-26 DIAGNOSIS — C569 Malignant neoplasm of unspecified ovary: Secondary | ICD-10-CM | POA: Diagnosis not present

## 2016-08-27 DIAGNOSIS — Z5111 Encounter for antineoplastic chemotherapy: Secondary | ICD-10-CM | POA: Diagnosis not present

## 2016-08-27 DIAGNOSIS — C569 Malignant neoplasm of unspecified ovary: Secondary | ICD-10-CM | POA: Diagnosis not present

## 2016-09-14 DIAGNOSIS — C786 Secondary malignant neoplasm of retroperitoneum and peritoneum: Secondary | ICD-10-CM | POA: Diagnosis not present

## 2016-09-14 DIAGNOSIS — Z8543 Personal history of malignant neoplasm of ovary: Secondary | ICD-10-CM | POA: Diagnosis not present

## 2016-09-14 DIAGNOSIS — Z9889 Other specified postprocedural states: Secondary | ICD-10-CM | POA: Diagnosis not present

## 2016-09-14 DIAGNOSIS — Z85828 Personal history of other malignant neoplasm of skin: Secondary | ICD-10-CM | POA: Diagnosis not present

## 2016-09-14 DIAGNOSIS — C569 Malignant neoplasm of unspecified ovary: Secondary | ICD-10-CM | POA: Diagnosis not present

## 2016-09-14 DIAGNOSIS — Z9049 Acquired absence of other specified parts of digestive tract: Secondary | ICD-10-CM | POA: Diagnosis not present

## 2016-09-14 DIAGNOSIS — Z9079 Acquired absence of other genital organ(s): Secondary | ICD-10-CM | POA: Diagnosis not present

## 2016-09-14 DIAGNOSIS — Z90710 Acquired absence of both cervix and uterus: Secondary | ICD-10-CM | POA: Diagnosis not present

## 2016-09-14 DIAGNOSIS — Z90722 Acquired absence of ovaries, bilateral: Secondary | ICD-10-CM | POA: Diagnosis not present

## 2016-09-17 DIAGNOSIS — I341 Nonrheumatic mitral (valve) prolapse: Secondary | ICD-10-CM | POA: Diagnosis not present

## 2016-09-17 DIAGNOSIS — C569 Malignant neoplasm of unspecified ovary: Secondary | ICD-10-CM | POA: Diagnosis not present

## 2016-09-17 DIAGNOSIS — F329 Major depressive disorder, single episode, unspecified: Secondary | ICD-10-CM | POA: Diagnosis not present

## 2016-09-17 DIAGNOSIS — Z5111 Encounter for antineoplastic chemotherapy: Secondary | ICD-10-CM | POA: Diagnosis not present

## 2016-09-17 DIAGNOSIS — E785 Hyperlipidemia, unspecified: Secondary | ICD-10-CM | POA: Diagnosis not present

## 2016-09-17 DIAGNOSIS — Z85828 Personal history of other malignant neoplasm of skin: Secondary | ICD-10-CM | POA: Diagnosis not present

## 2016-09-21 DIAGNOSIS — S60561A Insect bite (nonvenomous) of right hand, initial encounter: Secondary | ICD-10-CM | POA: Diagnosis not present

## 2016-10-08 DIAGNOSIS — I341 Nonrheumatic mitral (valve) prolapse: Secondary | ICD-10-CM | POA: Diagnosis not present

## 2016-10-08 DIAGNOSIS — E785 Hyperlipidemia, unspecified: Secondary | ICD-10-CM | POA: Diagnosis not present

## 2016-10-08 DIAGNOSIS — Z9079 Acquired absence of other genital organ(s): Secondary | ICD-10-CM | POA: Diagnosis not present

## 2016-10-08 DIAGNOSIS — C569 Malignant neoplasm of unspecified ovary: Secondary | ICD-10-CM | POA: Diagnosis not present

## 2016-10-08 DIAGNOSIS — Z5111 Encounter for antineoplastic chemotherapy: Secondary | ICD-10-CM | POA: Diagnosis not present

## 2016-10-08 DIAGNOSIS — Z90710 Acquired absence of both cervix and uterus: Secondary | ICD-10-CM | POA: Diagnosis not present

## 2016-10-08 DIAGNOSIS — Z90722 Acquired absence of ovaries, bilateral: Secondary | ICD-10-CM | POA: Diagnosis not present

## 2016-10-08 DIAGNOSIS — Z85828 Personal history of other malignant neoplasm of skin: Secondary | ICD-10-CM | POA: Diagnosis not present

## 2016-10-08 DIAGNOSIS — F329 Major depressive disorder, single episode, unspecified: Secondary | ICD-10-CM | POA: Diagnosis not present

## 2016-10-08 DIAGNOSIS — Z9049 Acquired absence of other specified parts of digestive tract: Secondary | ICD-10-CM | POA: Diagnosis not present

## 2016-10-08 DIAGNOSIS — R971 Elevated cancer antigen 125 [CA 125]: Secondary | ICD-10-CM | POA: Diagnosis not present

## 2016-10-09 DIAGNOSIS — I158 Other secondary hypertension: Secondary | ICD-10-CM | POA: Diagnosis not present

## 2016-10-09 DIAGNOSIS — R809 Proteinuria, unspecified: Secondary | ICD-10-CM | POA: Diagnosis not present

## 2016-10-09 DIAGNOSIS — C569 Malignant neoplasm of unspecified ovary: Secondary | ICD-10-CM | POA: Diagnosis not present

## 2016-10-09 DIAGNOSIS — Z5111 Encounter for antineoplastic chemotherapy: Secondary | ICD-10-CM | POA: Diagnosis not present

## 2016-10-09 DIAGNOSIS — T451X5A Adverse effect of antineoplastic and immunosuppressive drugs, initial encounter: Secondary | ICD-10-CM | POA: Diagnosis not present

## 2016-10-09 DIAGNOSIS — E785 Hyperlipidemia, unspecified: Secondary | ICD-10-CM | POA: Diagnosis not present

## 2016-10-13 DIAGNOSIS — I10 Essential (primary) hypertension: Secondary | ICD-10-CM | POA: Diagnosis not present

## 2016-10-13 DIAGNOSIS — E782 Mixed hyperlipidemia: Secondary | ICD-10-CM | POA: Diagnosis not present

## 2016-10-13 DIAGNOSIS — I890 Lymphedema, not elsewhere classified: Secondary | ICD-10-CM | POA: Diagnosis not present

## 2016-10-28 DIAGNOSIS — Z90710 Acquired absence of both cervix and uterus: Secondary | ICD-10-CM | POA: Diagnosis not present

## 2016-10-28 DIAGNOSIS — Z8541 Personal history of malignant neoplasm of cervix uteri: Secondary | ICD-10-CM | POA: Diagnosis not present

## 2016-10-28 DIAGNOSIS — C786 Secondary malignant neoplasm of retroperitoneum and peritoneum: Secondary | ICD-10-CM | POA: Diagnosis not present

## 2016-10-28 DIAGNOSIS — Z5111 Encounter for antineoplastic chemotherapy: Secondary | ICD-10-CM | POA: Diagnosis not present

## 2016-10-28 DIAGNOSIS — T451X5D Adverse effect of antineoplastic and immunosuppressive drugs, subsequent encounter: Secondary | ICD-10-CM | POA: Diagnosis not present

## 2016-10-28 DIAGNOSIS — Z85828 Personal history of other malignant neoplasm of skin: Secondary | ICD-10-CM | POA: Diagnosis not present

## 2016-10-28 DIAGNOSIS — E785 Hyperlipidemia, unspecified: Secondary | ICD-10-CM | POA: Diagnosis not present

## 2016-10-28 DIAGNOSIS — Z90722 Acquired absence of ovaries, bilateral: Secondary | ICD-10-CM | POA: Diagnosis not present

## 2016-10-28 DIAGNOSIS — Z9049 Acquired absence of other specified parts of digestive tract: Secondary | ICD-10-CM | POA: Diagnosis not present

## 2016-10-28 DIAGNOSIS — C569 Malignant neoplasm of unspecified ovary: Secondary | ICD-10-CM | POA: Diagnosis not present

## 2016-10-28 DIAGNOSIS — R809 Proteinuria, unspecified: Secondary | ICD-10-CM | POA: Diagnosis not present

## 2016-10-28 DIAGNOSIS — Z9079 Acquired absence of other genital organ(s): Secondary | ICD-10-CM | POA: Diagnosis not present

## 2016-10-28 DIAGNOSIS — I158 Other secondary hypertension: Secondary | ICD-10-CM | POA: Diagnosis not present

## 2016-10-28 DIAGNOSIS — Z9889 Other specified postprocedural states: Secondary | ICD-10-CM | POA: Diagnosis not present

## 2016-10-28 DIAGNOSIS — T451X5A Adverse effect of antineoplastic and immunosuppressive drugs, initial encounter: Secondary | ICD-10-CM | POA: Diagnosis not present

## 2016-10-29 DIAGNOSIS — R809 Proteinuria, unspecified: Secondary | ICD-10-CM | POA: Diagnosis not present

## 2016-10-29 DIAGNOSIS — T451X5A Adverse effect of antineoplastic and immunosuppressive drugs, initial encounter: Secondary | ICD-10-CM | POA: Diagnosis not present

## 2016-10-29 DIAGNOSIS — Z5111 Encounter for antineoplastic chemotherapy: Secondary | ICD-10-CM | POA: Diagnosis not present

## 2016-10-29 DIAGNOSIS — E785 Hyperlipidemia, unspecified: Secondary | ICD-10-CM | POA: Diagnosis not present

## 2016-10-29 DIAGNOSIS — I158 Other secondary hypertension: Secondary | ICD-10-CM | POA: Diagnosis not present

## 2016-10-29 DIAGNOSIS — C569 Malignant neoplasm of unspecified ovary: Secondary | ICD-10-CM | POA: Diagnosis not present

## 2016-11-16 DIAGNOSIS — T451X5A Adverse effect of antineoplastic and immunosuppressive drugs, initial encounter: Secondary | ICD-10-CM | POA: Diagnosis not present

## 2016-11-16 DIAGNOSIS — I159 Secondary hypertension, unspecified: Secondary | ICD-10-CM | POA: Diagnosis not present

## 2016-11-16 DIAGNOSIS — I158 Other secondary hypertension: Secondary | ICD-10-CM | POA: Diagnosis not present

## 2016-11-16 DIAGNOSIS — Z9079 Acquired absence of other genital organ(s): Secondary | ICD-10-CM | POA: Diagnosis not present

## 2016-11-16 DIAGNOSIS — R809 Proteinuria, unspecified: Secondary | ICD-10-CM | POA: Diagnosis not present

## 2016-11-16 DIAGNOSIS — Z90722 Acquired absence of ovaries, bilateral: Secondary | ICD-10-CM | POA: Diagnosis not present

## 2016-11-16 DIAGNOSIS — Z9889 Other specified postprocedural states: Secondary | ICD-10-CM | POA: Diagnosis not present

## 2016-11-16 DIAGNOSIS — T451X5D Adverse effect of antineoplastic and immunosuppressive drugs, subsequent encounter: Secondary | ICD-10-CM | POA: Diagnosis not present

## 2016-11-16 DIAGNOSIS — E785 Hyperlipidemia, unspecified: Secondary | ICD-10-CM | POA: Diagnosis not present

## 2016-11-16 DIAGNOSIS — Z5111 Encounter for antineoplastic chemotherapy: Secondary | ICD-10-CM | POA: Diagnosis not present

## 2016-11-16 DIAGNOSIS — C569 Malignant neoplasm of unspecified ovary: Secondary | ICD-10-CM | POA: Diagnosis not present

## 2016-11-16 DIAGNOSIS — Z90710 Acquired absence of both cervix and uterus: Secondary | ICD-10-CM | POA: Diagnosis not present

## 2016-11-19 DIAGNOSIS — C569 Malignant neoplasm of unspecified ovary: Secondary | ICD-10-CM | POA: Diagnosis not present

## 2016-11-19 DIAGNOSIS — E785 Hyperlipidemia, unspecified: Secondary | ICD-10-CM | POA: Diagnosis not present

## 2016-11-19 DIAGNOSIS — Z5111 Encounter for antineoplastic chemotherapy: Secondary | ICD-10-CM | POA: Diagnosis not present

## 2016-11-19 DIAGNOSIS — R809 Proteinuria, unspecified: Secondary | ICD-10-CM | POA: Diagnosis not present

## 2016-11-19 DIAGNOSIS — I159 Secondary hypertension, unspecified: Secondary | ICD-10-CM | POA: Diagnosis not present

## 2016-11-19 DIAGNOSIS — T451X5A Adverse effect of antineoplastic and immunosuppressive drugs, initial encounter: Secondary | ICD-10-CM | POA: Diagnosis not present

## 2016-12-09 DIAGNOSIS — E785 Hyperlipidemia, unspecified: Secondary | ICD-10-CM | POA: Diagnosis not present

## 2016-12-09 DIAGNOSIS — C569 Malignant neoplasm of unspecified ovary: Secondary | ICD-10-CM | POA: Diagnosis not present

## 2016-12-09 DIAGNOSIS — Z5111 Encounter for antineoplastic chemotherapy: Secondary | ICD-10-CM | POA: Diagnosis not present

## 2016-12-10 DIAGNOSIS — R809 Proteinuria, unspecified: Secondary | ICD-10-CM | POA: Diagnosis not present

## 2016-12-10 DIAGNOSIS — Z5111 Encounter for antineoplastic chemotherapy: Secondary | ICD-10-CM | POA: Diagnosis not present

## 2016-12-10 DIAGNOSIS — Z90722 Acquired absence of ovaries, bilateral: Secondary | ICD-10-CM | POA: Diagnosis not present

## 2016-12-10 DIAGNOSIS — C799 Secondary malignant neoplasm of unspecified site: Secondary | ICD-10-CM | POA: Diagnosis not present

## 2016-12-10 DIAGNOSIS — Z90710 Acquired absence of both cervix and uterus: Secondary | ICD-10-CM | POA: Diagnosis not present

## 2016-12-10 DIAGNOSIS — I10 Essential (primary) hypertension: Secondary | ICD-10-CM | POA: Diagnosis not present

## 2016-12-10 DIAGNOSIS — T451X5D Adverse effect of antineoplastic and immunosuppressive drugs, subsequent encounter: Secondary | ICD-10-CM | POA: Diagnosis not present

## 2016-12-10 DIAGNOSIS — C569 Malignant neoplasm of unspecified ovary: Secondary | ICD-10-CM | POA: Diagnosis not present

## 2016-12-10 DIAGNOSIS — E785 Hyperlipidemia, unspecified: Secondary | ICD-10-CM | POA: Diagnosis not present

## 2016-12-10 DIAGNOSIS — Z9079 Acquired absence of other genital organ(s): Secondary | ICD-10-CM | POA: Diagnosis not present

## 2016-12-30 DIAGNOSIS — Z5111 Encounter for antineoplastic chemotherapy: Secondary | ICD-10-CM | POA: Diagnosis not present

## 2016-12-30 DIAGNOSIS — E785 Hyperlipidemia, unspecified: Secondary | ICD-10-CM | POA: Diagnosis not present

## 2016-12-30 DIAGNOSIS — C569 Malignant neoplasm of unspecified ovary: Secondary | ICD-10-CM | POA: Diagnosis not present

## 2016-12-31 DIAGNOSIS — F329 Major depressive disorder, single episode, unspecified: Secondary | ICD-10-CM | POA: Diagnosis not present

## 2016-12-31 DIAGNOSIS — Z90722 Acquired absence of ovaries, bilateral: Secondary | ICD-10-CM | POA: Diagnosis not present

## 2016-12-31 DIAGNOSIS — C799 Secondary malignant neoplasm of unspecified site: Secondary | ICD-10-CM | POA: Diagnosis not present

## 2016-12-31 DIAGNOSIS — Z90710 Acquired absence of both cervix and uterus: Secondary | ICD-10-CM | POA: Diagnosis not present

## 2016-12-31 DIAGNOSIS — E785 Hyperlipidemia, unspecified: Secondary | ICD-10-CM | POA: Diagnosis not present

## 2016-12-31 DIAGNOSIS — I10 Essential (primary) hypertension: Secondary | ICD-10-CM | POA: Diagnosis not present

## 2016-12-31 DIAGNOSIS — C569 Malignant neoplasm of unspecified ovary: Secondary | ICD-10-CM | POA: Diagnosis not present

## 2016-12-31 DIAGNOSIS — R809 Proteinuria, unspecified: Secondary | ICD-10-CM | POA: Diagnosis not present

## 2016-12-31 DIAGNOSIS — T451X5D Adverse effect of antineoplastic and immunosuppressive drugs, subsequent encounter: Secondary | ICD-10-CM | POA: Diagnosis not present

## 2016-12-31 DIAGNOSIS — Z9079 Acquired absence of other genital organ(s): Secondary | ICD-10-CM | POA: Diagnosis not present

## 2016-12-31 DIAGNOSIS — Z5111 Encounter for antineoplastic chemotherapy: Secondary | ICD-10-CM | POA: Diagnosis not present

## 2017-01-13 DIAGNOSIS — F411 Generalized anxiety disorder: Secondary | ICD-10-CM | POA: Diagnosis not present

## 2017-01-13 DIAGNOSIS — C569 Malignant neoplasm of unspecified ovary: Secondary | ICD-10-CM | POA: Diagnosis not present

## 2017-01-13 DIAGNOSIS — E782 Mixed hyperlipidemia: Secondary | ICD-10-CM | POA: Diagnosis not present

## 2017-01-13 DIAGNOSIS — Z23 Encounter for immunization: Secondary | ICD-10-CM | POA: Diagnosis not present

## 2017-01-13 DIAGNOSIS — I10 Essential (primary) hypertension: Secondary | ICD-10-CM | POA: Diagnosis not present

## 2017-01-13 DIAGNOSIS — K219 Gastro-esophageal reflux disease without esophagitis: Secondary | ICD-10-CM | POA: Diagnosis not present

## 2017-01-13 DIAGNOSIS — F324 Major depressive disorder, single episode, in partial remission: Secondary | ICD-10-CM | POA: Diagnosis not present

## 2017-01-13 DIAGNOSIS — I890 Lymphedema, not elsewhere classified: Secondary | ICD-10-CM | POA: Diagnosis not present

## 2017-01-13 DIAGNOSIS — N182 Chronic kidney disease, stage 2 (mild): Secondary | ICD-10-CM | POA: Diagnosis not present

## 2017-01-14 ENCOUNTER — Emergency Department (HOSPITAL_COMMUNITY): Payer: No Typology Code available for payment source

## 2017-01-14 ENCOUNTER — Encounter (HOSPITAL_COMMUNITY): Payer: Self-pay | Admitting: Emergency Medicine

## 2017-01-14 ENCOUNTER — Emergency Department (HOSPITAL_COMMUNITY)
Admission: EM | Admit: 2017-01-14 | Discharge: 2017-01-15 | Disposition: A | Discharge status: Home / Self Care | Payer: No Typology Code available for payment source | Attending: Emergency Medicine | Admitting: Emergency Medicine

## 2017-01-14 DIAGNOSIS — Y998 Other external cause status: Secondary | ICD-10-CM | POA: Insufficient documentation

## 2017-01-14 DIAGNOSIS — Z87891 Personal history of nicotine dependence: Secondary | ICD-10-CM | POA: Diagnosis not present

## 2017-01-14 DIAGNOSIS — Y92410 Unspecified street and highway as the place of occurrence of the external cause: Secondary | ICD-10-CM | POA: Insufficient documentation

## 2017-01-14 DIAGNOSIS — Z7982 Long term (current) use of aspirin: Secondary | ICD-10-CM | POA: Insufficient documentation

## 2017-01-14 DIAGNOSIS — Z5111 Encounter for antineoplastic chemotherapy: Secondary | ICD-10-CM | POA: Diagnosis not present

## 2017-01-14 DIAGNOSIS — Z90710 Acquired absence of both cervix and uterus: Secondary | ICD-10-CM | POA: Diagnosis not present

## 2017-01-14 DIAGNOSIS — S161XXA Strain of muscle, fascia and tendon at neck level, initial encounter: Secondary | ICD-10-CM | POA: Diagnosis not present

## 2017-01-14 DIAGNOSIS — F419 Anxiety disorder, unspecified: Secondary | ICD-10-CM | POA: Diagnosis not present

## 2017-01-14 DIAGNOSIS — F329 Major depressive disorder, single episode, unspecified: Secondary | ICD-10-CM | POA: Insufficient documentation

## 2017-01-14 DIAGNOSIS — T451X5D Adverse effect of antineoplastic and immunosuppressive drugs, subsequent encounter: Secondary | ICD-10-CM | POA: Diagnosis not present

## 2017-01-14 DIAGNOSIS — R809 Proteinuria, unspecified: Secondary | ICD-10-CM | POA: Diagnosis not present

## 2017-01-14 DIAGNOSIS — C569 Malignant neoplasm of unspecified ovary: Secondary | ICD-10-CM | POA: Diagnosis not present

## 2017-01-14 DIAGNOSIS — S0990XA Unspecified injury of head, initial encounter: Secondary | ICD-10-CM | POA: Diagnosis not present

## 2017-01-14 DIAGNOSIS — Z79899 Other long term (current) drug therapy: Secondary | ICD-10-CM | POA: Diagnosis not present

## 2017-01-14 DIAGNOSIS — Z85828 Personal history of other malignant neoplasm of skin: Secondary | ICD-10-CM | POA: Diagnosis not present

## 2017-01-14 DIAGNOSIS — T451X5A Adverse effect of antineoplastic and immunosuppressive drugs, initial encounter: Secondary | ICD-10-CM | POA: Diagnosis not present

## 2017-01-14 DIAGNOSIS — Z9079 Acquired absence of other genital organ(s): Secondary | ICD-10-CM | POA: Diagnosis not present

## 2017-01-14 DIAGNOSIS — M25531 Pain in right wrist: Secondary | ICD-10-CM | POA: Diagnosis not present

## 2017-01-14 DIAGNOSIS — R079 Chest pain, unspecified: Secondary | ICD-10-CM | POA: Diagnosis not present

## 2017-01-14 DIAGNOSIS — Y9389 Activity, other specified: Secondary | ICD-10-CM | POA: Diagnosis not present

## 2017-01-14 DIAGNOSIS — J449 Chronic obstructive pulmonary disease, unspecified: Secondary | ICD-10-CM | POA: Diagnosis not present

## 2017-01-14 DIAGNOSIS — I10 Essential (primary) hypertension: Secondary | ICD-10-CM | POA: Diagnosis not present

## 2017-01-14 DIAGNOSIS — Z8543 Personal history of malignant neoplasm of ovary: Secondary | ICD-10-CM | POA: Diagnosis not present

## 2017-01-14 DIAGNOSIS — S199XXA Unspecified injury of neck, initial encounter: Secondary | ICD-10-CM | POA: Diagnosis not present

## 2017-01-14 DIAGNOSIS — Z90722 Acquired absence of ovaries, bilateral: Secondary | ICD-10-CM | POA: Diagnosis not present

## 2017-01-14 DIAGNOSIS — I158 Other secondary hypertension: Secondary | ICD-10-CM | POA: Diagnosis not present

## 2017-01-14 LAB — I-STAT CHEM 8, ED
BUN: 24 mg/dL — AB (ref 6–20)
CALCIUM ION: 1.09 mmol/L — AB (ref 1.15–1.40)
CHLORIDE: 99 mmol/L — AB (ref 101–111)
CREATININE: 1.5 mg/dL — AB (ref 0.44–1.00)
Glucose, Bld: 110 mg/dL — ABNORMAL HIGH (ref 65–99)
HEMATOCRIT: 32 % — AB (ref 36.0–46.0)
Hemoglobin: 10.9 g/dL — ABNORMAL LOW (ref 12.0–15.0)
Potassium: 3.8 mmol/L (ref 3.5–5.1)
Sodium: 139 mmol/L (ref 135–145)
TCO2: 30 mmol/L (ref 22–32)

## 2017-01-14 MED ORDER — IOPAMIDOL (ISOVUE-300) INJECTION 61%
INTRAVENOUS | Status: AC
  Administered 2017-01-14: 75 mL
  Filled 2017-01-14: qty 75
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
## 2017-01-14 NOTE — ED Triage Notes (Signed)
Per Merry Proud PA pt acuity changed to 3
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
## 2017-01-20 DIAGNOSIS — I158 Other secondary hypertension: Secondary | ICD-10-CM | POA: Diagnosis not present

## 2017-01-20 DIAGNOSIS — F329 Major depressive disorder, single episode, unspecified: Secondary | ICD-10-CM | POA: Diagnosis not present

## 2017-01-20 DIAGNOSIS — R809 Proteinuria, unspecified: Secondary | ICD-10-CM | POA: Diagnosis not present

## 2017-01-20 DIAGNOSIS — C569 Malignant neoplasm of unspecified ovary: Secondary | ICD-10-CM | POA: Diagnosis not present

## 2017-01-20 DIAGNOSIS — T451X5A Adverse effect of antineoplastic and immunosuppressive drugs, initial encounter: Secondary | ICD-10-CM | POA: Diagnosis not present

## 2017-01-20 DIAGNOSIS — Z5111 Encounter for antineoplastic chemotherapy: Secondary | ICD-10-CM | POA: Diagnosis not present

## 2017-01-21 DIAGNOSIS — F329 Major depressive disorder, single episode, unspecified: Secondary | ICD-10-CM | POA: Diagnosis not present

## 2017-01-21 DIAGNOSIS — I158 Other secondary hypertension: Secondary | ICD-10-CM | POA: Diagnosis not present

## 2017-01-21 DIAGNOSIS — Z5111 Encounter for antineoplastic chemotherapy: Secondary | ICD-10-CM | POA: Diagnosis not present

## 2017-01-21 DIAGNOSIS — R809 Proteinuria, unspecified: Secondary | ICD-10-CM | POA: Diagnosis not present

## 2017-01-21 DIAGNOSIS — C569 Malignant neoplasm of unspecified ovary: Secondary | ICD-10-CM | POA: Diagnosis not present

## 2017-01-21 DIAGNOSIS — T451X5A Adverse effect of antineoplastic and immunosuppressive drugs, initial encounter: Secondary | ICD-10-CM | POA: Diagnosis not present

## 2017-02-11 DIAGNOSIS — Z90710 Acquired absence of both cervix and uterus: Secondary | ICD-10-CM | POA: Diagnosis not present

## 2017-02-11 DIAGNOSIS — Z5111 Encounter for antineoplastic chemotherapy: Secondary | ICD-10-CM | POA: Diagnosis not present

## 2017-02-11 DIAGNOSIS — T451X5D Adverse effect of antineoplastic and immunosuppressive drugs, subsequent encounter: Secondary | ICD-10-CM | POA: Diagnosis not present

## 2017-02-11 DIAGNOSIS — F329 Major depressive disorder, single episode, unspecified: Secondary | ICD-10-CM | POA: Diagnosis not present

## 2017-02-11 DIAGNOSIS — C569 Malignant neoplasm of unspecified ovary: Secondary | ICD-10-CM | POA: Diagnosis not present

## 2017-02-11 DIAGNOSIS — R809 Proteinuria, unspecified: Secondary | ICD-10-CM | POA: Diagnosis not present

## 2017-02-11 DIAGNOSIS — Z85828 Personal history of other malignant neoplasm of skin: Secondary | ICD-10-CM | POA: Diagnosis not present

## 2017-02-11 DIAGNOSIS — I10 Essential (primary) hypertension: Secondary | ICD-10-CM | POA: Diagnosis not present

## 2017-02-11 DIAGNOSIS — Z9889 Other specified postprocedural states: Secondary | ICD-10-CM | POA: Diagnosis not present

## 2017-02-11 DIAGNOSIS — Z9079 Acquired absence of other genital organ(s): Secondary | ICD-10-CM | POA: Diagnosis not present

## 2017-02-11 DIAGNOSIS — R971 Elevated cancer antigen 125 [CA 125]: Secondary | ICD-10-CM | POA: Diagnosis not present

## 2017-02-11 DIAGNOSIS — Z90722 Acquired absence of ovaries, bilateral: Secondary | ICD-10-CM | POA: Diagnosis not present

## 2017-02-11 DIAGNOSIS — I158 Other secondary hypertension: Secondary | ICD-10-CM | POA: Diagnosis not present

## 2017-02-11 DIAGNOSIS — Z9049 Acquired absence of other specified parts of digestive tract: Secondary | ICD-10-CM | POA: Diagnosis not present

## 2017-02-12 DIAGNOSIS — Z5111 Encounter for antineoplastic chemotherapy: Secondary | ICD-10-CM | POA: Diagnosis not present

## 2017-02-12 DIAGNOSIS — I10 Essential (primary) hypertension: Secondary | ICD-10-CM | POA: Diagnosis not present

## 2017-02-12 DIAGNOSIS — C569 Malignant neoplasm of unspecified ovary: Secondary | ICD-10-CM | POA: Diagnosis not present

## 2017-03-11 DIAGNOSIS — I10 Essential (primary) hypertension: Secondary | ICD-10-CM | POA: Diagnosis not present

## 2017-03-11 DIAGNOSIS — Z6831 Body mass index (BMI) 31.0-31.9, adult: Secondary | ICD-10-CM | POA: Diagnosis not present

## 2017-03-11 DIAGNOSIS — Z90710 Acquired absence of both cervix and uterus: Secondary | ICD-10-CM | POA: Diagnosis not present

## 2017-03-11 DIAGNOSIS — Z5111 Encounter for antineoplastic chemotherapy: Secondary | ICD-10-CM | POA: Diagnosis not present

## 2017-03-11 DIAGNOSIS — Z9079 Acquired absence of other genital organ(s): Secondary | ICD-10-CM | POA: Diagnosis not present

## 2017-03-11 DIAGNOSIS — F329 Major depressive disorder, single episode, unspecified: Secondary | ICD-10-CM | POA: Diagnosis not present

## 2017-03-11 DIAGNOSIS — E669 Obesity, unspecified: Secondary | ICD-10-CM | POA: Diagnosis not present

## 2017-03-11 DIAGNOSIS — R809 Proteinuria, unspecified: Secondary | ICD-10-CM | POA: Diagnosis not present

## 2017-03-11 DIAGNOSIS — C569 Malignant neoplasm of unspecified ovary: Secondary | ICD-10-CM | POA: Diagnosis not present

## 2017-03-11 DIAGNOSIS — Z90722 Acquired absence of ovaries, bilateral: Secondary | ICD-10-CM | POA: Diagnosis not present

## 2017-03-12 DIAGNOSIS — Z5111 Encounter for antineoplastic chemotherapy: Secondary | ICD-10-CM | POA: Diagnosis not present

## 2017-03-12 DIAGNOSIS — C569 Malignant neoplasm of unspecified ovary: Secondary | ICD-10-CM | POA: Diagnosis not present

## 2017-03-25 DIAGNOSIS — G8929 Other chronic pain: Secondary | ICD-10-CM | POA: Diagnosis not present

## 2017-03-25 DIAGNOSIS — Z90722 Acquired absence of ovaries, bilateral: Secondary | ICD-10-CM | POA: Diagnosis not present

## 2017-03-25 DIAGNOSIS — Z6831 Body mass index (BMI) 31.0-31.9, adult: Secondary | ICD-10-CM | POA: Diagnosis not present

## 2017-03-25 DIAGNOSIS — K219 Gastro-esophageal reflux disease without esophagitis: Secondary | ICD-10-CM | POA: Diagnosis not present

## 2017-03-25 DIAGNOSIS — I10 Essential (primary) hypertension: Secondary | ICD-10-CM | POA: Diagnosis not present

## 2017-03-25 DIAGNOSIS — E669 Obesity, unspecified: Secondary | ICD-10-CM | POA: Diagnosis not present

## 2017-03-25 DIAGNOSIS — T451X5D Adverse effect of antineoplastic and immunosuppressive drugs, subsequent encounter: Secondary | ICD-10-CM | POA: Diagnosis not present

## 2017-03-25 DIAGNOSIS — F329 Major depressive disorder, single episode, unspecified: Secondary | ICD-10-CM | POA: Diagnosis not present

## 2017-03-25 DIAGNOSIS — Z5111 Encounter for antineoplastic chemotherapy: Secondary | ICD-10-CM | POA: Diagnosis not present

## 2017-03-25 DIAGNOSIS — R809 Proteinuria, unspecified: Secondary | ICD-10-CM | POA: Diagnosis not present

## 2017-03-25 DIAGNOSIS — C569 Malignant neoplasm of unspecified ovary: Secondary | ICD-10-CM | POA: Diagnosis not present

## 2017-03-25 DIAGNOSIS — Z90710 Acquired absence of both cervix and uterus: Secondary | ICD-10-CM | POA: Diagnosis not present

## 2017-03-25 DIAGNOSIS — M549 Dorsalgia, unspecified: Secondary | ICD-10-CM | POA: Diagnosis not present

## 2017-03-26 DIAGNOSIS — H04123 Dry eye syndrome of bilateral lacrimal glands: Secondary | ICD-10-CM | POA: Diagnosis not present

## 2017-03-26 DIAGNOSIS — Z961 Presence of intraocular lens: Secondary | ICD-10-CM | POA: Diagnosis not present

## 2017-03-26 DIAGNOSIS — H43393 Other vitreous opacities, bilateral: Secondary | ICD-10-CM | POA: Diagnosis not present

## 2017-03-26 DIAGNOSIS — H40023 Open angle with borderline findings, high risk, bilateral: Secondary | ICD-10-CM | POA: Diagnosis not present

## 2017-03-30 DIAGNOSIS — C569 Malignant neoplasm of unspecified ovary: Secondary | ICD-10-CM | POA: Diagnosis not present

## 2017-03-30 DIAGNOSIS — Z5111 Encounter for antineoplastic chemotherapy: Secondary | ICD-10-CM | POA: Diagnosis not present

## 2017-03-31 DIAGNOSIS — C569 Malignant neoplasm of unspecified ovary: Secondary | ICD-10-CM | POA: Diagnosis not present

## 2017-03-31 DIAGNOSIS — Z5111 Encounter for antineoplastic chemotherapy: Secondary | ICD-10-CM | POA: Diagnosis not present

## 2017-04-14 DIAGNOSIS — Z5111 Encounter for antineoplastic chemotherapy: Secondary | ICD-10-CM | POA: Diagnosis not present

## 2017-04-14 DIAGNOSIS — Z85828 Personal history of other malignant neoplasm of skin: Secondary | ICD-10-CM | POA: Diagnosis not present

## 2017-04-14 DIAGNOSIS — R809 Proteinuria, unspecified: Secondary | ICD-10-CM | POA: Diagnosis not present

## 2017-04-14 DIAGNOSIS — T451X5D Adverse effect of antineoplastic and immunosuppressive drugs, subsequent encounter: Secondary | ICD-10-CM | POA: Diagnosis not present

## 2017-04-14 DIAGNOSIS — T451X5A Adverse effect of antineoplastic and immunosuppressive drugs, initial encounter: Secondary | ICD-10-CM | POA: Diagnosis not present

## 2017-04-14 DIAGNOSIS — Z90722 Acquired absence of ovaries, bilateral: Secondary | ICD-10-CM | POA: Diagnosis not present

## 2017-04-14 DIAGNOSIS — Z9079 Acquired absence of other genital organ(s): Secondary | ICD-10-CM | POA: Diagnosis not present

## 2017-04-14 DIAGNOSIS — C784 Secondary malignant neoplasm of small intestine: Secondary | ICD-10-CM | POA: Diagnosis not present

## 2017-04-14 DIAGNOSIS — R971 Elevated cancer antigen 125 [CA 125]: Secondary | ICD-10-CM | POA: Diagnosis not present

## 2017-04-14 DIAGNOSIS — C786 Secondary malignant neoplasm of retroperitoneum and peritoneum: Secondary | ICD-10-CM | POA: Diagnosis not present

## 2017-04-14 DIAGNOSIS — Z90710 Acquired absence of both cervix and uterus: Secondary | ICD-10-CM | POA: Diagnosis not present

## 2017-04-14 DIAGNOSIS — C569 Malignant neoplasm of unspecified ovary: Secondary | ICD-10-CM | POA: Diagnosis not present

## 2017-04-14 DIAGNOSIS — I158 Other secondary hypertension: Secondary | ICD-10-CM | POA: Diagnosis not present

## 2017-04-14 DIAGNOSIS — E785 Hyperlipidemia, unspecified: Secondary | ICD-10-CM | POA: Diagnosis not present

## 2017-04-14 DIAGNOSIS — Z8543 Personal history of malignant neoplasm of ovary: Secondary | ICD-10-CM | POA: Diagnosis not present

## 2017-04-21 DIAGNOSIS — I158 Other secondary hypertension: Secondary | ICD-10-CM | POA: Diagnosis not present

## 2017-04-21 DIAGNOSIS — R809 Proteinuria, unspecified: Secondary | ICD-10-CM | POA: Diagnosis not present

## 2017-04-21 DIAGNOSIS — T451X5A Adverse effect of antineoplastic and immunosuppressive drugs, initial encounter: Secondary | ICD-10-CM | POA: Diagnosis not present

## 2017-04-21 DIAGNOSIS — E785 Hyperlipidemia, unspecified: Secondary | ICD-10-CM | POA: Diagnosis not present

## 2017-04-21 DIAGNOSIS — Z5111 Encounter for antineoplastic chemotherapy: Secondary | ICD-10-CM | POA: Diagnosis not present

## 2017-04-21 DIAGNOSIS — C569 Malignant neoplasm of unspecified ovary: Secondary | ICD-10-CM | POA: Diagnosis not present

## 2017-04-22 DIAGNOSIS — I158 Other secondary hypertension: Secondary | ICD-10-CM | POA: Diagnosis not present

## 2017-04-22 DIAGNOSIS — Z5111 Encounter for antineoplastic chemotherapy: Secondary | ICD-10-CM | POA: Diagnosis not present

## 2017-04-22 DIAGNOSIS — E785 Hyperlipidemia, unspecified: Secondary | ICD-10-CM | POA: Diagnosis not present

## 2017-04-22 DIAGNOSIS — T451X5A Adverse effect of antineoplastic and immunosuppressive drugs, initial encounter: Secondary | ICD-10-CM | POA: Diagnosis not present

## 2017-04-22 DIAGNOSIS — C569 Malignant neoplasm of unspecified ovary: Secondary | ICD-10-CM | POA: Diagnosis not present

## 2017-04-22 DIAGNOSIS — R809 Proteinuria, unspecified: Secondary | ICD-10-CM | POA: Diagnosis not present

## 2017-05-02 ENCOUNTER — Encounter (HOSPITAL_COMMUNITY): Payer: Self-pay | Admitting: *Deleted

## 2017-05-02 ENCOUNTER — Emergency Department (HOSPITAL_COMMUNITY): Payer: Medicare Other

## 2017-05-02 ENCOUNTER — Other Ambulatory Visit: Payer: Self-pay

## 2017-05-02 ENCOUNTER — Observation Stay (HOSPITAL_COMMUNITY)
Admission: EM | Admit: 2017-05-02 | Discharge: 2017-05-03 | Disposition: A | Discharge status: Home / Self Care | Payer: Medicare Other | Attending: General Surgery | Admitting: General Surgery

## 2017-05-02 DIAGNOSIS — K56609 Unspecified intestinal obstruction, unspecified as to partial versus complete obstruction: Secondary | ICD-10-CM | POA: Diagnosis not present

## 2017-05-02 DIAGNOSIS — E785 Hyperlipidemia, unspecified: Secondary | ICD-10-CM | POA: Insufficient documentation

## 2017-05-02 DIAGNOSIS — I10 Essential (primary) hypertension: Secondary | ICD-10-CM | POA: Diagnosis not present

## 2017-05-02 DIAGNOSIS — Z791 Long term (current) use of non-steroidal anti-inflammatories (NSAID): Secondary | ICD-10-CM | POA: Insufficient documentation

## 2017-05-02 DIAGNOSIS — Z87891 Personal history of nicotine dependence: Secondary | ICD-10-CM | POA: Insufficient documentation

## 2017-05-02 DIAGNOSIS — C44729 Squamous cell carcinoma of skin of left lower limb, including hip: Secondary | ICD-10-CM | POA: Insufficient documentation

## 2017-05-02 DIAGNOSIS — Z7982 Long term (current) use of aspirin: Secondary | ICD-10-CM | POA: Insufficient documentation

## 2017-05-02 DIAGNOSIS — J449 Chronic obstructive pulmonary disease, unspecified: Secondary | ICD-10-CM | POA: Insufficient documentation

## 2017-05-02 DIAGNOSIS — K219 Gastro-esophageal reflux disease without esophagitis: Secondary | ICD-10-CM | POA: Insufficient documentation

## 2017-05-02 DIAGNOSIS — Z79899 Other long term (current) drug therapy: Secondary | ICD-10-CM | POA: Diagnosis not present

## 2017-05-02 DIAGNOSIS — R1084 Generalized abdominal pain: Secondary | ICD-10-CM

## 2017-05-02 DIAGNOSIS — F329 Major depressive disorder, single episode, unspecified: Secondary | ICD-10-CM | POA: Insufficient documentation

## 2017-05-02 DIAGNOSIS — Z9221 Personal history of antineoplastic chemotherapy: Secondary | ICD-10-CM | POA: Diagnosis not present

## 2017-05-02 DIAGNOSIS — C569 Malignant neoplasm of unspecified ovary: Secondary | ICD-10-CM | POA: Insufficient documentation

## 2017-05-02 DIAGNOSIS — F419 Anxiety disorder, unspecified: Secondary | ICD-10-CM | POA: Diagnosis not present

## 2017-05-02 DIAGNOSIS — K56600 Partial intestinal obstruction, unspecified as to cause: Secondary | ICD-10-CM | POA: Diagnosis not present

## 2017-05-02 DIAGNOSIS — K449 Diaphragmatic hernia without obstruction or gangrene: Secondary | ICD-10-CM | POA: Diagnosis not present

## 2017-05-02 DIAGNOSIS — R109 Unspecified abdominal pain: Secondary | ICD-10-CM | POA: Diagnosis not present

## 2017-05-02 HISTORY — DX: Unspecified osteoarthritis, unspecified site: M19.90

## 2017-05-02 HISTORY — DX: Low back pain, unspecified: M54.50

## 2017-05-02 HISTORY — DX: Malignant neoplasm of head, face and neck: C76.0

## 2017-05-02 HISTORY — DX: Pure hypercholesterolemia, unspecified: E78.00

## 2017-05-02 HISTORY — DX: Other chronic pain: G89.29

## 2017-05-02 HISTORY — DX: Low back pain: M54.5

## 2017-05-02 HISTORY — DX: Personal history of other medical treatment: Z92.89

## 2017-05-02 HISTORY — DX: Cerebral infarction, unspecified: I63.9

## 2017-05-02 LAB — CBC WITH DIFFERENTIAL/PLATELET
BASOS ABS: 0 10*3/uL (ref 0.0–0.1)
BASOS PCT: 0 %
Eosinophils Absolute: 0 10*3/uL (ref 0.0–0.7)
Eosinophils Relative: 0 %
HEMATOCRIT: 35.3 % — AB (ref 36.0–46.0)
HEMOGLOBIN: 11 g/dL — AB (ref 12.0–15.0)
Lymphocytes Relative: 15 %
Lymphs Abs: 1.5 10*3/uL (ref 0.7–4.0)
MCH: 23.3 pg — ABNORMAL LOW (ref 26.0–34.0)
MCHC: 31.2 g/dL (ref 30.0–36.0)
MCV: 74.8 fL — ABNORMAL LOW (ref 78.0–100.0)
Monocytes Absolute: 0.3 10*3/uL (ref 0.1–1.0)
Monocytes Relative: 3 %
NEUTROS ABS: 8.3 10*3/uL — AB (ref 1.7–7.7)
NEUTROS PCT: 82 %
Platelets: 334 10*3/uL (ref 150–400)
RBC: 4.72 MIL/uL (ref 3.87–5.11)
RDW: 18.2 % — ABNORMAL HIGH (ref 11.5–15.5)
WBC: 10.1 10*3/uL (ref 4.0–10.5)

## 2017-05-02 LAB — URINALYSIS, ROUTINE W REFLEX MICROSCOPIC
Bacteria, UA: NONE SEEN
Bilirubin Urine: NEGATIVE
Glucose, UA: NEGATIVE mg/dL
KETONES UR: NEGATIVE mg/dL
Nitrite: NEGATIVE
PROTEIN: 30 mg/dL — AB
Specific Gravity, Urine: >1.046 — ABNORMAL HIGH (ref 1.005–1.030)
pH: 5 (ref 5.0–8.0)

## 2017-05-02 LAB — COMPREHENSIVE METABOLIC PANEL
ALBUMIN: 3.8 g/dL (ref 3.5–5.0)
ALT: 15 U/L (ref 14–54)
AST: 38 U/L (ref 15–41)
Alkaline Phosphatase: 140 U/L — ABNORMAL HIGH (ref 38–126)
Anion gap: 13 (ref 5–15)
BILIRUBIN TOTAL: 1.1 mg/dL (ref 0.3–1.2)
BUN: 21 mg/dL — AB (ref 6–20)
CO2: 22 mmol/L (ref 22–32)
CREATININE: 1.37 mg/dL — AB (ref 0.44–1.00)
Calcium: 9.4 mg/dL (ref 8.9–10.3)
Chloride: 99 mmol/L — ABNORMAL LOW (ref 101–111)
GFR calc Af Amer: 44 mL/min — ABNORMAL LOW (ref >60)
GFR calc non Af Amer: 38 mL/min — ABNORMAL LOW (ref >60)
GLUCOSE: 133 mg/dL — AB (ref 65–99)
POTASSIUM: 4 mmol/L (ref 3.5–5.1)
Sodium: 134 mmol/L — ABNORMAL LOW (ref 135–145)
TOTAL PROTEIN: 7.6 g/dL (ref 6.5–8.1)

## 2017-05-02 LAB — LIPASE, BLOOD: Lipase: 27 U/L (ref 11–51)

## 2017-05-02 MED ORDER — LORAZEPAM 1 MG PO TABS
1.0000 mg | ORAL_TABLET | Freq: Two times a day (BID) | ORAL | Status: DC
  Administered 2017-05-02 – 2017-05-03 (×2): 1 mg via ORAL
  Filled 2017-05-02 (×2): qty 1

## 2017-05-02 MED ORDER — IOPAMIDOL (ISOVUE-300) INJECTION 61%
INTRAVENOUS | Status: AC
  Administered 2017-05-02: 75 mL
  Filled 2017-05-02: qty 75

## 2017-05-02 MED ORDER — ENOXAPARIN SODIUM 40 MG/0.4ML ~~LOC~~ SOLN
40.0000 mg | SUBCUTANEOUS | Status: DC
  Filled 2017-05-02: qty 0.4

## 2017-05-02 MED ORDER — DULOXETINE HCL 60 MG PO CPEP
60.0000 mg | ORAL_CAPSULE | Freq: Every day | ORAL | Status: DC
  Administered 2017-05-02: 60 mg via ORAL
  Filled 2017-05-02: qty 1

## 2017-05-02 MED ORDER — PANTOPRAZOLE SODIUM 40 MG PO TBEC
40.0000 mg | DELAYED_RELEASE_TABLET | Freq: Two times a day (BID) | ORAL | Status: DC
  Administered 2017-05-02 – 2017-05-03 (×2): 40 mg via ORAL
  Filled 2017-05-02 (×2): qty 1

## 2017-05-02 MED ORDER — ONDANSETRON HCL 4 MG/2ML IJ SOLN
4.0000 mg | Freq: Once | INTRAMUSCULAR | Status: AC
  Administered 2017-05-02: 4 mg via INTRAVENOUS
  Filled 2017-05-02: qty 2

## 2017-05-02 MED ORDER — IBUPROFEN 800 MG PO TABS: 800.0000 mg | ORAL_TABLET | Freq: Four times a day (QID) | ORAL | Status: DC | PRN

## 2017-05-02 MED ORDER — SODIUM CHLORIDE 0.9 % IV SOLN
INTRAVENOUS | Status: DC
  Administered 2017-05-02: 22:00:00 via INTRAVENOUS

## 2017-05-02 MED ORDER — ALBUTEROL SULFATE (2.5 MG/3ML) 0.083% IN NEBU: 2.5000 mg | INHALATION_SOLUTION | Freq: Four times a day (QID) | RESPIRATORY_TRACT | Status: DC | PRN

## 2017-05-02 MED ORDER — BUPROPION HCL ER (XL) 150 MG PO TB24
450.0000 mg | ORAL_TABLET | Freq: Every morning | ORAL | Status: DC
  Administered 2017-05-03: 450 mg via ORAL
  Filled 2017-05-02: qty 3

## 2017-05-02 MED ORDER — FENTANYL CITRATE (PF) 100 MCG/2ML IJ SOLN: 25.0000 ug | INTRAMUSCULAR | Status: DC | PRN

## 2017-05-02 MED ORDER — FENTANYL CITRATE (PF) 100 MCG/2ML IJ SOLN
50.0000 ug | Freq: Once | INTRAMUSCULAR | Status: AC
  Administered 2017-05-02: 50 ug via INTRAVENOUS
  Filled 2017-05-02: qty 2

## 2017-05-02 MED ORDER — AMLODIPINE BESYLATE 5 MG PO TABS
5.0000 mg | ORAL_TABLET | Freq: Every day | ORAL | Status: DC
  Administered 2017-05-03: 5 mg via ORAL
  Filled 2017-05-02: qty 1

## 2017-05-02 MED ORDER — ONDANSETRON 4 MG PO TBDP: 4.0000 mg | ORAL_TABLET | Freq: Once | ORAL | Status: DC | PRN

## 2017-05-02 MED ORDER — HYDRALAZINE HCL 20 MG/ML IJ SOLN: 10.0000 mg | INTRAMUSCULAR | Status: DC | PRN

## 2017-05-02 MED ORDER — LETROZOLE 2.5 MG PO TABS: 2.5000 mg | ORAL_TABLET | Freq: Every day | ORAL | Status: DC

## 2017-05-02 NOTE — H&P (Signed)
[name] is an 71 y.o. female.   Chief Complaint: N/V HPI: [name] has a history of ovarian cancer and previously underwent hysterectomy and small bowel resection in a combined procedure at [facility].  She completed chemotherapy about 3 or 4 years ago.  She continues on immunotherapy.  She has had a small bowel obstruction requiring admission since that time.  That resolved without any surgery.  She developed nausea, vomiting, and bloating at home which was similar to her last episode of small bowel obstruction and she came to the emergency department for further evaluation.  CT scan of the abdomen and pelvis shows small bowel obstruction without significant complicating features.  I was asked to see her for admission and treatment.  After the CT scan, she had a large loose bowel movement in the emergency department.  Past Medical History:  Diagnosis Date  . Anxiety   . Cancer [facility])    '10-Ovarian cancer(tx. surgery, chemotherapy) -residual lymph edema lt. leg,restart of chemotherapy this week-Dr. Verne Spurr)  . COPD (chronic obstructive pulmonary disease) (HCC)    mild  . Depression   . GERD (gastroesophageal reflux disease)   . Heart murmur   . History of hiatal hernia   . Hypertension   . Lymph edema 03-10-13   left leg( hip to foot)-consistent-remains an issue 10-11-14-wears compression hose  . Mitral valve prolapse 03-10-13   rare palpitations  . Nodule of neck 08-21-13   Chemotherapy to start this week for this(10-11-14 was told a yr ago- couldn't feel anything after chemo tx.)  . Ovarian cancer ([place]) 2011  . Pneumonia   . Portacath in place 08-21-13   left chest remains as of 10-11-14  . Seizures ([place])    '70- x1 post Phenergan IV for nausea during pregnancy  . Shortness of breath dyspnea   . Squamous cell skin cancer    left leg- one area recently excised, anther area is planned  . Transfusion history    during chemotherapy, none recent  . Wrist fracture     11'14-casted only,no surgery-no problems now    Past Surgical History:  Procedure Laterality Date  . ABDOMINAL HYSTERECTOMY     '79-no cancer  . ANGIOPLASTY     NO ANGIOPLASTY-just had cardiac cath in 2006 (normal coronaries) 12/06/15  . APPENDECTOMY     '10  with Ovarian staging surgery  . CARDIAC CATHETERIZATION     normal coronaries, normal LVF 2006 (Dr. Fransico Him)  . CATARACT EXTRACTION, BILATERAL Bilateral   . COLONOSCOPY WITH PROPOFOL N/A 10/18/2014   Procedure: COLONOSCOPY WITH PROPOFOL;  Surgeon: Garlan Fair, MD;  Location: [facility];  Service: Endoscopy;  Laterality: N/A;  . FRACTURE SURGERY Right    '12 -ORIF Rt. shoulder  . KYPHOPLASTY N/A 12/11/2015   Procedure: L2 KYPHOPLASTY;  Surgeon: Melina Schools, MD;  Location: [place];  Service: Orthopedics;  Laterality: N/A;  . LAPAROTOMY FOR STAGING / RESTAGING     '10- [facility] (Dr.Nycum)-Dr. Pippitt, oncology- surgery Ovarian Cancer  . PORTACATH PLACEMENT     has left chest pac  . TUBAL LIGATION      History reviewed. No pertinent family history. Social History:  reports that she quit smoking about 31 years ago. Her smoking use included cigarettes. She has a 19.50 pack-year smoking history. she has never used smokeless tobacco. She reports that she does not drink alcohol or use drugs.  Allergies:  Allergies  Allergen Reactions  . Carboplatin Shortness Of Breath  . Phenergan [  Promethazine] Other (See Comments)    SEIZURES  . Morphine And Related Itching and Other (See Comments)    Jittery (also)  . Other Other (See Comments)    -Zine(s) cause TREMORS  . Benadryl [Diphenhydramine] Anxiety  . Clindamycin/Lincomycin Nausea And Vomiting  . Penicillins Rash    Has patient had a PCN reaction causing immediate rash, facial/tongue/throat swelling, SOB or lightheadedness with hypotension: Yes Has patient had a PCN reaction causing severe rash involving mucus membranes or skin necrosis: No Has patient had a PCN reaction  that required hospitalization: No Has patient had a PCN reaction occurring within the last 10 years: Yes If all of the above answers are "NO", then may proceed with Cephalosporin use.  Marland Kitchen Prochlorperazine Other (See Comments)    Restless Legs with compazine  . Sulfa Antibiotics Nausea And Vomiting     (Not in a hospital admission)  Results for orders placed or performed during the hospital encounter of 05/02/17 (from the past 48 hour(s))  Lipase, blood     Status: None   Collection Time: 05/02/17  2:39 PM  Result Value Ref Range   Lipase 27 11 - 51 U/L  Comprehensive metabolic panel     Status: Abnormal   Collection Time: 05/02/17  2:39 PM  Result Value Ref Range   Sodium 134 (L) 135 - 145 mmol/L   Potassium 4.0 3.5 - 5.1 mmol/L   Chloride 99 (L) 101 - 111 mmol/L   CO2 22 22 - 32 mmol/L   Glucose, Bld 133 (H) 65 - 99 mg/dL   BUN 21 (H) 6 - 20 mg/dL   Creatinine, Ser 1.37 (H) 0.44 - 1.00 mg/dL   Calcium 9.4 8.9 - 10.3 mg/dL   Total Protein 7.6 6.5 - 8.1 g/dL   Albumin 3.8 3.5 - 5.0 g/dL   AST 38 15 - 41 U/L   ALT 15 14 - 54 U/L   Alkaline Phosphatase 140 (H) 38 - 126 U/L   Total Bilirubin 1.1 0.3 - 1.2 mg/dL   GFR calc non Af Amer 38 (L) >60 mL/min   GFR calc Af Amer 44 (L) >60 mL/min    Comment: (NOTE) The eGFR has been calculated using the CKD EPI equation. This calculation has not been validated in all clinical situations. eGFR's persistently <60 mL/min signify possible Chronic Kidney Disease.    Anion gap 13 5 - 15  CBC with Differential     Status: Abnormal   Collection Time: 05/02/17  3:22 PM  Result Value Ref Range   WBC 10.1 4.0 - 10.5 K/uL   RBC 4.72 3.87 - 5.11 MIL/uL   Hemoglobin 11.0 (L) 12.0 - 15.0 g/dL   HCT 35.3 (L) 36.0 - 46.0 %   MCV 74.8 (L) 78.0 - 100.0 fL   MCH 23.3 (L) 26.0 - 34.0 pg   MCHC 31.2 30.0 - 36.0 g/dL   RDW 18.2 (H) 11.5 - 15.5 %   Platelets 334 150 - 400 K/uL   Neutrophils Relative % 82 %   Neutro Abs 8.3 (H) 1.7 - 7.7 K/uL    Lymphocytes Relative 15 %   Lymphs Abs 1.5 0.7 - 4.0 K/uL   Monocytes Relative 3 %   Monocytes Absolute 0.3 0.1 - 1.0 K/uL   Eosinophils Relative 0 %   Eosinophils Absolute 0.0 0.0 - 0.7 K/uL   Basophils Relative 0 %   Basophils Absolute 0.0 0.0 - 0.1 K/uL   Ct Abdomen Pelvis W Contrast  Result Date: 05/02/2017  CLINICAL DATA:  71 year old with abdominal pain. History of multiple bowel blockages. History of ovarian cancer and currently on immunotherapy. EXAM: CT ABDOMEN AND PELVIS WITH CONTRAST TECHNIQUE: Multidetector CT imaging of the abdomen and pelvis was performed using the standard protocol following bolus administration of intravenous contrast. CONTRAST:  51m ISOVUE-300 IOPAMIDOL (ISOVUE-300) INJECTION 61% COMPARISON:  06/15/2016 FINDINGS: Lower chest: Tiny nodule in left lower lobe on sequence 4, image 6 is probably calcified and suggestive for calcified granuloma. Otherwise, the lung bases are clear. No pleural effusions. Hepatobiliary: Normal appearance of the liver, gallbladder and portal venous system. Pancreas: Normal appearance of the pancreas without inflammation or duct dilatation. Spleen: Normal appearance of spleen without enlargement. Adrenals/Urinary Tract: Normal adrenal glands. Normal urinary bladder. Stomach/Bowel: Large hiatal hernia which contains a large amount of fluid. Dilated loops of small bowel with mild mesenteric stranding. Surgical clips involving the distal small bowel near the ileocecal valve. There appears to be a transition point in the distal small bowel at this distal surgical anastomosis. This is best seen on sequence 3, image 60. Fluid in the right colon. Vascular/Lymphatic: Atherosclerotic calcifications involving the aorta and iliac arteries without an aortic aneurysm. There is no significant lymph node enlargement in the abdomen or pelvis. Reproductive: Status post hysterectomy. No adnexal masses. Other: Again noted is a peritoneal nodule in the right  posterior pelvis on sequence 3, image 67. This nodule measures 1.5 x 1.7 cm and previously measured 3.0 x 3.2 cm. No other suspicious peritoneal disease. Small amount of free fluid in the pelvis. Musculoskeletal: Again noted are multiple compression fractures in lumbar spine with augmentation cement at L5, L4 and L2. Chronic L1 compression fracture. A new compression fracture along the inferior endplate of L3. IMPRESSION: Dilated small bowel loops are suggestive for a small bowel obstruction. Small amount of free fluid in the pelvis with mild mesenteric edema. Transition point is likely in the distal small bowel just proximal to the ileocecal valve and at the surgical anastomosis. Large hiatal hernia containing a large amount of fluid. Patient may be at risk for aspiration. Decreased size of the peritoneal lesion in the right posterior pelvis. Findings suggest a positive response to patient's cancer therapy. L3 compression fracture is new since 06/15/2016. Multiple compression fractures throughout the lumbar spine. Electronically Signed   By: AMarkus DaftM.D.   On: 05/02/2017 17:32   Dg Abdomen Acute W/chest  Result Date: 05/02/2017 CLINICAL DATA:  Central abdominal pain. History of ovarian cancer and previous bowel obstruction. EXAM: DG ABDOMEN ACUTE W/ 1V CHEST COMPARISON:  CT 06/15/2016 and chest x-ray 04/05/2016 as well as chest CT 01/14/2017 FINDINGS: Left subclavian Port-A-Cath without significant change with tip obliquely along the right lateral wall of the SVC at the level of the junction of the brachiocephalic vein to SVC. Lungs are hypoinflated without focal airspace consolidation or effusion. Cardiomediastinal silhouette and remainder of the chest is unchanged to include moderate size hiatal hernia. There are a few air-filled minimally dilated small bowel loops in the left mid to upper abdomen measuring up to 3.3 cm in diameter. Air and stool present throughout the colon. There is no free peritoneal  air. There are several surgical clips of the left upper quadrant. Multiple surgical clips over the left pelvis and inguinal region. There are degenerate changes of the spine with curvature of the lumbar spine convex right. Previous multilevel kyphoplasties involving the lumbar spine. Known stable L1 compression fracture. IMPRESSION: No acute cardiopulmonary disease. A few air-filled minimally dilated  small bowel loops in the left abdomen which may be due to focal ileus versus early/ partial small bowel obstructive process. Stable L1 compression fracture. Multiple previous lumbar spine kyphoplasties. Electronically Signed   By: Marin Olp M.D.   On: 05/02/2017 15:22    Review of Systems  Constitutional: Negative for chills and fever.  HENT: Negative.   Eyes: Negative.   Respiratory: Negative for cough and shortness of breath.   Cardiovascular: Negative for chest pain.  Gastrointestinal: Positive for abdominal pain, constipation, nausea and vomiting.  Genitourinary: Negative.   Musculoskeletal: Negative.   Skin: Negative.   Neurological: Negative.   Endo/Heme/Allergies: Negative.   Psychiatric/Behavioral: Negative.     Blood pressure (!) 158/93, pulse 91, temperature 97.8 F (36.6 C), temperature source Oral, resp. rate 18, SpO2 97 %. Physical Exam  Constitutional: She appears well-developed and well-nourished. No distress.  HENT:  Head: Normocephalic.  Nose: Nose normal.  Mouth/Throat: Oropharynx is clear and moist.  Eyes: EOM are normal. Pupils are equal, round, and reactive to light.  Neck: Neck supple. No thyromegaly present.  Cardiovascular: Normal rate, regular rhythm, normal heart sounds and intact distal pulses.  Respiratory: Effort normal and breath sounds normal. No respiratory distress. She has no wheezes. She has no rales.  GI: Soft. She exhibits distension. There is no tenderness. There is no rebound and no guarding.  Mild distention, no tenderness, bowel sounds are  hypoactive  Musculoskeletal: Normal range of motion. She exhibits edema.  Chronic lymphedema left lower extremity  Skin: Skin is warm.  Psychiatric: She has a normal mood and affect.     Assessment/Plan PSBO -feels somewhat better after large BM in the emergency department.  Will admit for IV fluids and bowel rest.  Hopefully she will improve without surgery.  If she worsens, consider NG tube and small bowel protocol.  Ovarian cancer  Zenovia Jarred, MD 05/02/2017, 6:26 PM

## 2017-05-02 NOTE — ED Notes (Signed)
Attempted report x1. 

## 2017-05-02 NOTE — ED Notes (Signed)
Patient made aware of room assignment.

## 2017-05-02 NOTE — ED Notes (Signed)
Patient transported to CT 

## 2017-05-02 NOTE — ED Notes (Signed)
Pt transported to xray 

## 2017-05-02 NOTE — ED Triage Notes (Addendum)
Pt reports hx of multiple bowel blockages. Having lower to mid abd pain that started last night, states it feels similar to her blockages in past. Having n/v and denies passing any gas. Appears very uncomfortable at triage.

## 2017-05-02 NOTE — ED Notes (Signed)
When patient returned from CT stated she felt like she needed to have a large BM.  Patient went to restroom and had large amount of very loose stool.  States pain has relieved some after BM

## 2017-05-02 NOTE — ED Provider Notes (Signed)
[facility] Provider Note   CSN: [id number] Arrival date & time: 05/02/17  1353     History   Chief Complaint Chief Complaint  Patient presents with  . Abdominal Pain    HPI [name] is a 71 y.o. female.  HPI   71 year old female presents today with complaints of abdominal pain.  Patient reports she was in her usual state of health last night before dinner.  She notes she ate a salad, shortly after started to develop upper abdominal pain.  She notes this was coming and going and was very minimal and pain.  Patient notes that over the night symptoms progressed and have been steadily worsening.  Patient notes the onset of nausea and vomiting, she notes a normal bowel movement yesterday, but has not been able to pass gas or have a bowel movement today.  She notes this feels identical to previous episodes of bowel obstruction.  Patient reports sweats, but denies any objective fever.  She notes generalized weakness, diffuse abdominal pain worse in the upper abdomen.  Patient notes she took MiraLAX with water this morning, also took a prescribed laxative with no bowel movement or gas.  Patient denies any chest pain or shortness of breath.  Patient reports a significant medical history of ovarian cancer status post abdominal hysterectomy in 2010, status post 3 rounds of chemotherapy currently on immunotherapy.  Patient reports she does not take narcotics, reports little 3 water intake.   Past Medical History:  Diagnosis Date  . Anxiety   . Cancer [facility])    '10-Ovarian cancer(tx. surgery, chemotherapy) -residual lymph edema lt. leg,restart of chemotherapy this week-Dr. Verne Spurr)  . COPD (chronic obstructive pulmonary disease) (HCC)    mild  . Depression   . GERD (gastroesophageal reflux disease)   . Heart murmur   . History of hiatal hernia   . Hypertension   . Lymph edema 03-10-13   left leg( hip to foot)-consistent-remains an  issue 10-11-14-wears compression hose  . Mitral valve prolapse 03-10-13   rare palpitations  . Nodule of neck 08-21-13   Chemotherapy to start this week for this(10-11-14 was told a yr ago- couldn't feel anything after chemo tx.)  . Ovarian cancer ([place]) 2011  . Pneumonia   . Portacath in place 08-21-13   left chest remains as of 10-11-14  . Seizures ([place])    '70- x1 post Phenergan IV for nausea during pregnancy  . Shortness of breath dyspnea   . Squamous cell skin cancer    left leg- one area recently excised, anther area is planned  . Transfusion history    during chemotherapy, none recent  . Wrist fracture    11'14-casted only,no surgery-no problems now    Patient Active Problem List   Diagnosis Date Noted  . L2 vertebral fracture ([place]) 12/11/2015  . Hypokalemia 09/07/2013  . SBO (small bowel obstruction) ([place]) 09/05/2013  . Ovarian ca ([place]) 09/05/2013  . Dyslipidemia 09/05/2013  . Depression   . Anxiety   . GERD (gastroesophageal reflux disease)     Past Surgical History:  Procedure Laterality Date  . ABDOMINAL HYSTERECTOMY     '79-no cancer  . ANGIOPLASTY     NO ANGIOPLASTY-just had cardiac cath in 2006 (normal coronaries) 12/06/15  . APPENDECTOMY     '10  with Ovarian staging surgery  . CARDIAC CATHETERIZATION     normal coronaries, normal LVF 2006 (Dr. Fransico Him)  . CATARACT EXTRACTION, BILATERAL Bilateral   .  COLONOSCOPY WITH PROPOFOL N/A 10/18/2014   Procedure: COLONOSCOPY WITH PROPOFOL;  Surgeon: Garlan Fair, MD;  Location: [facility];  Service: Endoscopy;  Laterality: N/A;  . FRACTURE SURGERY Right    '12 -ORIF Rt. shoulder  . KYPHOPLASTY N/A 12/11/2015   Procedure: L2 KYPHOPLASTY;  Surgeon: Melina Schools, MD;  Location: [facility];  Service: Orthopedics;  Laterality: N/A;  . LAPAROTOMY FOR STAGING / RESTAGING     '10- [facility] (Dr.Nycum)-Dr. Pippitt, oncology- surgery Ovarian Cancer  . PORTACATH PLACEMENT     has left chest pac  . TUBAL LIGATION      OB  History    No data available       Home Medications    Prior to Admission medications   Medication Sig Start Date End Date Taking? Authorizing Provider  albuterol (VENTOLIN HFA) 108 (90 Base) MCG/ACT inhaler Inhale 1-2 puffs into the lungs every 6 (six) hours as needed for wheezing or shortness of breath.    [provider]  amLODipine (NORVASC) 5 MG tablet Take 5 mg by mouth daily.    [provider]  aspirin 81 MG tablet Take 81 mg by mouth daily.     [provider]  buPROPion (WELLBUTRIN XL) 300 MG 24 hr tablet Take 450 mg by mouth every morning.     [provider]  Cholecalciferol (VITAMIN D-3 PO) Take 1 capsule by mouth daily.    [provider]  DULoxetine (CYMBALTA) 60 MG capsule Take 60 mg by mouth at bedtime.    [provider]  ibuprofen (ADVIL,MOTRIN) 200 MG tablet Take 800 mg by mouth every 6 (six) hours as needed (for back pain).    [provider]  letrozole (FEMARA) 2.5 MG tablet Take 2.5 mg by mouth daily.    [provider]  LORazepam (ATIVAN) 1 MG tablet Take 1 mg by mouth 2 (two) times daily.     [provider]  MAGNESIUM PO Take 1 tablet by mouth daily.    [provider]  ondansetron (ZOFRAN) 4 MG tablet Take 1 tablet (4 mg total) by mouth every 8 (eight) hours as needed for nausea or vomiting. 12/11/15   Melina Schools, MD  pantoprazole (PROTONIX) 40 MG tablet Take 40 mg by mouth 2 (two) times daily.    [provider]  polyethylene glycol powder (GLYCOLAX/MIRALAX) powder Take 17-34 g by mouth daily.    [provider]  simvastatin (ZOCOR) 20 MG tablet Take 20 mg by mouth daily.    [provider]    Family History History reviewed. No pertinent family history.  Social History Social History   Tobacco Use  . Smoking status: Former Smoker    Packs/day: 1.50    Years: 13.00    Pack years: 19.50    Types: Cigarettes    Last attempt to quit:  03/10/1986    Years since quitting: 31.1  . Smokeless tobacco: Never Used  Substance Use Topics  . Alcohol use: No  . Drug use: No     Allergies   Carboplatin; Phenergan [promethazine]; Morphine and related; Other; Benadryl [diphenhydramine]; Clindamycin/lincomycin; Penicillins; Prochlorperazine; and Sulfa antibiotics   Review of Systems Review of Systems  All other systems reviewed and are negative.    Physical Exam Updated Vital Signs BP (!) 161/93   Pulse 91   Temp 97.8 F (36.6 C) (Oral)   Resp 18   SpO2 98%   Physical Exam  Constitutional: She is oriented to person, place, and  time. She appears well-developed and well-nourished.  HENT:  Head: Normocephalic and atraumatic.  Eyes: Conjunctivae are normal. Pupils are equal, round, and reactive to light. Right eye exhibits no discharge. Left eye exhibits no discharge. No scleral icterus.  Neck: Normal range of motion. No JVD present. No tracheal deviation present.  Pulmonary/Chest: Effort normal. No stridor.  Abdominal: Soft. She exhibits no distension and no mass. There is tenderness. There is no rebound and no guarding. No hernia.  Decreased bowel sounds-diffuse tenderness to palpation  Neurological: She is alert and oriented to person, place, and time. Coordination normal.  Psychiatric: She has a normal mood and affect. Her behavior is normal. Judgment and thought content normal.  Nursing note and vitals reviewed.  ED Treatments / Results  Labs (all labs ordered are listed, but only abnormal results are displayed) Labs Reviewed  COMPREHENSIVE METABOLIC PANEL - Abnormal; Notable for the following components:      Result Value   Sodium 134 (*)    Chloride 99 (*)    Glucose, Bld 133 (*)    BUN 21 (*)    Creatinine, Ser 1.37 (*)    Alkaline Phosphatase 140 (*)    GFR calc non Af Amer 38 (*)    GFR calc Af Amer 44 (*)    All other components within normal limits  CBC WITH DIFFERENTIAL/PLATELET - Abnormal;  Notable for the following components:   Hemoglobin 11.0 (*)    HCT 35.3 (*)    MCV 74.8 (*)    MCH 23.3 (*)    RDW 18.2 (*)    Neutro Abs 8.3 (*)    All other components within normal limits  LIPASE, BLOOD  URINALYSIS, ROUTINE W REFLEX MICROSCOPIC    EKG  EKG Interpretation None      Radiology Dg Abdomen Acute W/chest  Result Date: 05/02/2017 CLINICAL DATA:  Central abdominal pain. History of ovarian cancer and previous bowel obstruction. EXAM: DG ABDOMEN ACUTE W/ 1V CHEST COMPARISON:  CT 06/15/2016 and chest x-ray 04/05/2016 as well as chest CT 01/14/2017 FINDINGS: Left subclavian Port-A-Cath without significant change with tip obliquely along the right lateral wall of the SVC at the level of the junction of the brachiocephalic vein to SVC. Lungs are hypoinflated without focal airspace consolidation or effusion. Cardiomediastinal silhouette and remainder of the chest is unchanged to include moderate size hiatal hernia. There are a few air-filled minimally dilated small bowel loops in the left mid to upper abdomen measuring up to 3.3 cm in diameter. Air and stool present throughout the colon. There is no free peritoneal air. There are several surgical clips of the left upper quadrant. Multiple surgical clips over the left pelvis and inguinal region. There are degenerate changes of the spine with curvature of the lumbar spine convex right. Previous multilevel kyphoplasties involving the lumbar spine. Known stable L1 compression fracture. IMPRESSION: No acute cardiopulmonary disease. A few air-filled minimally dilated small bowel loops in the left abdomen which may be due to focal ileus versus early/ partial small bowel obstructive process. Stable L1 compression fracture. Multiple previous lumbar spine kyphoplasties. Electronically Signed   By: Marin Olp M.D.   On: 05/02/2017 15:22    Procedures Procedures (including critical care time)  Medications Ordered in ED Medications    iopamidol (ISOVUE-300) 61 % injection (not administered)  fentaNYL (SUBLIMAZE) injection 50 mcg (50 mcg Intravenous Given 05/02/17 1444)  ondansetron (ZOFRAN) injection 4 mg (4 mg Intravenous Given 05/02/17 1444)     Initial Impression /  Assessment and Plan / ED Course  I have reviewed the triage vital signs and the nursing notes.  Pertinent labs & imaging results that were available during my care of the patient were reviewed by me and considered in my medical decision making (see chart for details).      Final Clinical Impressions(s) / ED Diagnoses   Final diagnoses:  Generalized abdominal pain   Labs: CBC, CMP, lipase  Imaging: CT abdomen and pelvis with contrast  Consults:  Therapeutics: Fentanyl, Zofran  Discharge Meds:   Assessment/Plan: 71 year old female presents today with complaints of abdominal pain.  She has a significant past medical history of bowel obstructions, reports this feels very similar.  Patient is afebrile at this time, basic labs will be ordered, abdominal imaging will be obtained. Pt care will be assigned to oncoming provider pending labs, imagining, and disposition.    ED Discharge Orders    None       Francee Gentile 05/02/17 1545    Noemi Chapel, MD 05/03/17 [phone]

## 2017-05-03 ENCOUNTER — Encounter (HOSPITAL_COMMUNITY): Payer: Self-pay | Admitting: General Practice

## 2017-05-03 DIAGNOSIS — K56600 Partial intestinal obstruction, unspecified as to cause: Secondary | ICD-10-CM | POA: Diagnosis not present

## 2017-05-03 DIAGNOSIS — K56609 Unspecified intestinal obstruction, unspecified as to partial versus complete obstruction: Secondary | ICD-10-CM | POA: Diagnosis not present

## 2017-05-03 LAB — CBC
HCT: 35.2 % — ABNORMAL LOW (ref 36.0–46.0)
Hemoglobin: 10.7 g/dL — ABNORMAL LOW (ref 12.0–15.0)
MCH: 22.8 pg — ABNORMAL LOW (ref 26.0–34.0)
MCHC: 30.4 g/dL (ref 30.0–36.0)
MCV: 75.1 fL — ABNORMAL LOW (ref 78.0–100.0)
Platelets: 316 K/uL (ref 150–400)
RBC: 4.69 MIL/uL (ref 3.87–5.11)
RDW: 18.6 % — ABNORMAL HIGH (ref 11.5–15.5)
WBC: 7.1 K/uL (ref 4.0–10.5)

## 2017-05-03 LAB — BASIC METABOLIC PANEL
ANION GAP: 8 (ref 5–15)
BUN: 14 mg/dL (ref 6–20)
CO2: 26 mmol/L (ref 22–32)
Calcium: 8.7 mg/dL — ABNORMAL LOW (ref 8.9–10.3)
Chloride: 105 mmol/L (ref 101–111)
Creatinine, Ser: 1.2 mg/dL — ABNORMAL HIGH (ref 0.44–1.00)
GFR calc Af Amer: 52 mL/min — ABNORMAL LOW (ref >60)
GFR calc non Af Amer: 45 mL/min — ABNORMAL LOW (ref >60)
Glucose, Bld: 87 mg/dL (ref 65–99)
POTASSIUM: 3.1 mmol/L — AB (ref 3.5–5.1)
Sodium: 139 mmol/L (ref 135–145)

## 2017-05-03 NOTE — Progress Notes (Signed)
Patient ID: [name], female   DOB: Feb 02, 1946, 71 y.o.   MRN: [id number] [facility] Progress Note:   * No surgery found *  Subjective: Mental status is clear; asking for diet and discharge Objective: Vital signs in last 24 hours: Temp:  [97.8 F (36.6 C)-98.3 F (36.8 C)] 98.1 F (36.7 C) (12/24 0600) Pulse Rate:  [78-99] 78 (12/24 0600) Resp:  [18] 18 (12/24 0600) BP: (112-174)/(49-95) 145/65 (12/24 0600) SpO2:  [92 %-100 %] 100 % (12/24 0600) Weight:  [77.1 kg (170 lb)] 77.1 kg (170 lb) (12/23 2015)  Intake/Output from previous day: 12/23 0701 - 12/24 0700 In: 764.6 [I.V.:764.6] Out: -  Intake/Output this shift: No intake/output data recorded.  Physical Exam: Work of breathing is normal.  Passed flatus  Lab Results:  Results for orders placed or performed during the hospital encounter of 05/02/17 (from the past 48 hour(s))  Lipase, blood     Status: None   Collection Time: 05/02/17  2:39 PM  Result Value Ref Range   Lipase 27 11 - 51 U/L  Comprehensive metabolic panel     Status: Abnormal   Collection Time: 05/02/17  2:39 PM  Result Value Ref Range   Sodium 134 (L) 135 - 145 mmol/L   Potassium 4.0 3.5 - 5.1 mmol/L   Chloride 99 (L) 101 - 111 mmol/L   CO2 22 22 - 32 mmol/L   Glucose, Bld 133 (H) 65 - 99 mg/dL   BUN 21 (H) 6 - 20 mg/dL   Creatinine, Ser 1.37 (H) 0.44 - 1.00 mg/dL   Calcium 9.4 8.9 - 10.3 mg/dL   Total Protein 7.6 6.5 - 8.1 g/dL   Albumin 3.8 3.5 - 5.0 g/dL   AST 38 15 - 41 U/L   ALT 15 14 - 54 U/L   Alkaline Phosphatase 140 (H) 38 - 126 U/L   Total Bilirubin 1.1 0.3 - 1.2 mg/dL   GFR calc non Af Amer 38 (L) >60 mL/min   GFR calc Af Amer 44 (L) >60 mL/min    Comment: (NOTE) The eGFR has been calculated using the CKD EPI equation. This calculation has not been validated in all clinical situations. eGFR's persistently <60 mL/min signify possible Chronic Kidney Disease.    Anion gap 13 5 - 15  CBC with Differential     Status:  Abnormal   Collection Time: 05/02/17  3:22 PM  Result Value Ref Range   WBC 10.1 4.0 - 10.5 K/uL   RBC 4.72 3.87 - 5.11 MIL/uL   Hemoglobin 11.0 (L) 12.0 - 15.0 g/dL   HCT 35.3 (L) 36.0 - 46.0 %   MCV 74.8 (L) 78.0 - 100.0 fL   MCH 23.3 (L) 26.0 - 34.0 pg   MCHC 31.2 30.0 - 36.0 g/dL   RDW 18.2 (H) 11.5 - 15.5 %   Platelets 334 150 - 400 K/uL   Neutrophils Relative % 82 %   Neutro Abs 8.3 (H) 1.7 - 7.7 K/uL   Lymphocytes Relative 15 %   Lymphs Abs 1.5 0.7 - 4.0 K/uL   Monocytes Relative 3 %   Monocytes Absolute 0.3 0.1 - 1.0 K/uL   Eosinophils Relative 0 %   Eosinophils Absolute 0.0 0.0 - 0.7 K/uL   Basophils Relative 0 %   Basophils Absolute 0.0 0.0 - 0.1 K/uL  Urinalysis, Routine w reflex microscopic     Status: Abnormal   Collection Time: 05/02/17  9:55 PM  Result Value Ref Range  Color, Urine YELLOW YELLOW   APPearance CLEAR CLEAR   Specific Gravity, Urine >1.046 (H) 1.005 - 1.030   pH 5.0 5.0 - 8.0   Glucose, UA NEGATIVE NEGATIVE mg/dL   Hgb urine dipstick SMALL (A) NEGATIVE   Bilirubin Urine NEGATIVE NEGATIVE   Ketones, ur NEGATIVE NEGATIVE mg/dL   Protein, ur 30 (A) NEGATIVE mg/dL   Nitrite NEGATIVE NEGATIVE   Leukocytes, UA SMALL (A) NEGATIVE   RBC / HPF 0-5 0 - 5 RBC/hpf   WBC, UA TOO NUMEROUS TO COUNT 0 - 5 WBC/hpf   Bacteria, UA NONE SEEN NONE SEEN   Squamous Epithelial / LPF 0-5 (A) NONE SEEN   Mucus PRESENT   Basic metabolic panel     Status: Abnormal   Collection Time: 05/03/17  6:34 AM  Result Value Ref Range   Sodium 139 135 - 145 mmol/L   Potassium 3.1 (L) 3.5 - 5.1 mmol/L   Chloride 105 101 - 111 mmol/L   CO2 26 22 - 32 mmol/L   Glucose, Bld 87 65 - 99 mg/dL   BUN 14 6 - 20 mg/dL   Creatinine, Ser 1.20 (H) 0.44 - 1.00 mg/dL   Calcium 8.7 (L) 8.9 - 10.3 mg/dL   GFR calc non Af Amer 45 (L) >60 mL/min   GFR calc Af Amer 52 (L) >60 mL/min    Comment: (NOTE) The eGFR has been calculated using the CKD EPI equation. This calculation has not been  validated in all clinical situations. eGFR's persistently <60 mL/min signify possible Chronic Kidney Disease.    Anion gap 8 5 - 15  CBC     Status: Abnormal   Collection Time: 05/03/17  6:34 AM  Result Value Ref Range   WBC 7.1 4.0 - 10.5 K/uL   RBC 4.69 3.87 - 5.11 MIL/uL   Hemoglobin 10.7 (L) 12.0 - 15.0 g/dL   HCT 35.2 (L) 36.0 - 46.0 %   MCV 75.1 (L) 78.0 - 100.0 fL   MCH 22.8 (L) 26.0 - 34.0 pg   MCHC 30.4 30.0 - 36.0 g/dL   RDW 18.6 (H) 11.5 - 15.5 %   Platelets 316 150 - 400 K/uL    Radiology/Results: Ct Abdomen Pelvis W Contrast  Result Date: 05/02/2017 CLINICAL DATA:  71 year old with abdominal pain. History of multiple bowel blockages. History of ovarian cancer and currently on immunotherapy. EXAM: CT ABDOMEN AND PELVIS WITH CONTRAST TECHNIQUE: Multidetector CT imaging of the abdomen and pelvis was performed using the standard protocol following bolus administration of intravenous contrast. CONTRAST:  43m ISOVUE-300 IOPAMIDOL (ISOVUE-300) INJECTION 61% COMPARISON:  06/15/2016 FINDINGS: Lower chest: Tiny nodule in left lower lobe on sequence 4, image 6 is probably calcified and suggestive for calcified granuloma. Otherwise, the lung bases are clear. No pleural effusions. Hepatobiliary: Normal appearance of the liver, gallbladder and portal venous system. Pancreas: Normal appearance of the pancreas without inflammation or duct dilatation. Spleen: Normal appearance of spleen without enlargement. Adrenals/Urinary Tract: Normal adrenal glands. Normal urinary bladder. Stomach/Bowel: Large hiatal hernia which contains a large amount of fluid. Dilated loops of small bowel with mild mesenteric stranding. Surgical clips involving the distal small bowel near the ileocecal valve. There appears to be a transition point in the distal small bowel at this distal surgical anastomosis. This is best seen on sequence 3, image 60. Fluid in the right colon. Vascular/Lymphatic: Atherosclerotic  calcifications involving the aorta and iliac arteries without an aortic aneurysm. There is no significant lymph node enlargement in the abdomen  or pelvis. Reproductive: Status post hysterectomy. No adnexal masses. Other: Again noted is a peritoneal nodule in the right posterior pelvis on sequence 3, image 67. This nodule measures 1.5 x 1.7 cm and previously measured 3.0 x 3.2 cm. No other suspicious peritoneal disease. Small amount of free fluid in the pelvis. Musculoskeletal: Again noted are multiple compression fractures in lumbar spine with augmentation cement at L5, L4 and L2. Chronic L1 compression fracture. A new compression fracture along the inferior endplate of L3. IMPRESSION: Dilated small bowel loops are suggestive for a small bowel obstruction. Small amount of free fluid in the pelvis with mild mesenteric edema. Transition point is likely in the distal small bowel just proximal to the ileocecal valve and at the surgical anastomosis. Large hiatal hernia containing a large amount of fluid. Patient may be at risk for aspiration. Decreased size of the peritoneal lesion in the right posterior pelvis. Findings suggest a positive response to patient's cancer therapy. L3 compression fracture is new since 06/15/2016. Multiple compression fractures throughout the lumbar spine. Electronically Signed   By: Markus Daft M.D.   On: 05/02/2017 17:32   Dg Abdomen Acute W/chest  Result Date: 05/02/2017 CLINICAL DATA:  Central abdominal pain. History of ovarian cancer and previous bowel obstruction. EXAM: DG ABDOMEN ACUTE W/ 1V CHEST COMPARISON:  CT 06/15/2016 and chest x-ray 04/05/2016 as well as chest CT 01/14/2017 FINDINGS: Left subclavian Port-A-Cath without significant change with tip obliquely along the right lateral wall of the SVC at the level of the junction of the brachiocephalic vein to SVC. Lungs are hypoinflated without focal airspace consolidation or effusion. Cardiomediastinal silhouette and remainder of  the chest is unchanged to include moderate size hiatal hernia. There are a few air-filled minimally dilated small bowel loops in the left mid to upper abdomen measuring up to 3.3 cm in diameter. Air and stool present throughout the colon. There is no free peritoneal air. There are several surgical clips of the left upper quadrant. Multiple surgical clips over the left pelvis and inguinal region. There are degenerate changes of the spine with curvature of the lumbar spine convex right. Previous multilevel kyphoplasties involving the lumbar spine. Known stable L1 compression fracture. IMPRESSION: No acute cardiopulmonary disease. A few air-filled minimally dilated small bowel loops in the left abdomen which may be due to focal ileus versus early/ partial small bowel obstructive process. Stable L1 compression fracture. Multiple previous lumbar spine kyphoplasties. Electronically Signed   By: Marin Olp M.D.   On: 05/02/2017 15:22    Anti-infectives: Anti-infectives (From admission, onward)   None      Assessment/Plan: Problem List: Patient Active Problem List   Diagnosis Date Noted  . L2 vertebral fracture ([place]) 12/11/2015  . Hypokalemia 09/07/2013  . SBO (small bowel obstruction) ([place]) 09/05/2013  . Ovarian ca ([place]) 09/05/2013  . Dyslipidemia 09/05/2013  . Depression   . Anxiety   . GERD (gastroesophageal reflux disease)     Will begin clears.  Patient may negotiate discharge later today if she is able to tolerate enough clear liquids.   * No surgery found *    LOS: 1 day   Matt B. Hassell Done, MD, [facility] [phone] beeper [phone]  05/03/2017 9:42 AM

## 2017-05-03 NOTE — Care Management CC44 (Signed)
Condition Code 44 Documentation Completed  Patient Details  Name: [name] MRN: [id number] Date of Birth: 01/25/1946   Condition Code 44 given:   yes  Patient signature on Condition Code 44 notice:   yes Documentation of 2 MD's agreement:   yes Code 44 added to claim:   yes  Patient refused to sign  Marilu Favre, RN 05/03/2017, 2:30 PM

## 2017-05-06 NOTE — Discharge Summary (Signed)
Physician Discharge Summary  Patient ID: [name] MRN: [id number] DOB/AGE: 16-Aug-1945 71 y.o.  Admit date: 05/02/2017 Discharge date: 05/06/2017  Admission Diagnoses:  Discharge Diagnoses:  Active Problems:   SBO (small bowel obstruction) (HCC)   Discharged Condition: good  Hospital Course: 71 yo female presented to the ER with nausea and vomiting and was found to have a bowel obstruction. Within hours of her arrival she had resolution of symptoms and a bowel movement. She was admitted to the hospital. HD 1 she was tolerating liquids and continued to have no abdominal complaints and was discharged home.  Consults: None  Significant Diagnostic Studies:  CBC    Component Value Date/Time   WBC 7.1 05/03/2017 0634   RBC 4.69 05/03/2017 0634   HGB 10.7 (L) 05/03/2017 0634   HCT 35.2 (L) 05/03/2017 0634   PLT 316 05/03/2017 0634   MCV 75.1 (L) 05/03/2017 0634   MCH 22.8 (L) 05/03/2017 0634   MCHC 30.4 05/03/2017 0634   RDW 18.6 (H) 05/03/2017 0634   LYMPHSABS 1.5 05/02/2017 1522   MONOABS 0.3 05/02/2017 1522   EOSABS 0.0 05/02/2017 1522   BASOSABS 0.0 05/02/2017 1522     Treatments: IV hydration  Discharge Exam: Blood pressure 130/72, pulse 80, temperature 98.2 F (36.8 C), temperature source Oral, resp. rate 18, height 5\' 3"  (1.6 m), weight 77.1 kg (169 lb 15.6 oz), SpO2 98 %. General appearance: alert and cooperative Head: Normocephalic, without obvious abnormality, atraumatic Resp: clear to auscultation bilaterally Cardio: regular rate and rhythm, S1, S2 normal, no murmur, click, rub or gallop GI: soft, non-tender; bowel sounds normal; no masses,  no organomegaly  Disposition: 01-Home or Self Care  Discharge Instructions    Discharge instructions   Complete by:  As directed    Slowly advance to normal diet over the next 3 days     Allergies as of 05/03/2017      Reactions   Carboplatin Shortness Of Breath   Phenergan [promethazine] Other (See  Comments)   SEIZURES   Morphine And Related Itching, Other (See Comments)   Jittery (also)   Other Other (See Comments)   -Zine(s) cause TREMORS   Benadryl [diphenhydramine] Anxiety   Clindamycin/lincomycin Nausea And Vomiting   Penicillins Rash   Has patient had a PCN reaction causing immediate rash, facial/tongue/throat swelling, SOB or lightheadedness with hypotension: Yes Has patient had a PCN reaction causing severe rash involving mucus membranes or skin necrosis: No Has patient had a PCN reaction that required hospitalization: No Has patient had a PCN reaction occurring within the last 10 years: Yes If all of the above answers are "NO", then may proceed with Cephalosporin use.   Prochlorperazine Other (See Comments)   Restless Legs with compazine   Sulfa Antibiotics Nausea And Vomiting      Medication List    TAKE these medications   amLODipine 5 MG tablet Commonly known as:  NORVASC Take 5 mg by mouth daily.   aspirin 81 MG tablet Take 81 mg by mouth daily.   atorvastatin 20 MG tablet Commonly known as:  LIPITOR Take 20 mg by mouth daily.   buPROPion 300 MG 24 hr tablet Commonly known as:  WELLBUTRIN XL Take 450 mg by mouth every morning.   DULoxetine 60 MG capsule Commonly known as:  CYMBALTA Take 60 mg by mouth at bedtime.   hydrochlorothiazide 25 MG tablet Commonly known as:  HYDRODIURIL Take 25 mg by mouth daily.   ibuprofen 200 MG tablet Commonly known  as:  ADVIL,MOTRIN Take 800 mg by mouth daily.   LORazepam 1 MG tablet Commonly known as:  ATIVAN Take 1 mg by mouth 2 (two) times daily.   MAGNESIUM PO Take 1 tablet by mouth daily.   multivitamin tablet Take 1 tablet by mouth daily.   ondansetron 4 MG tablet Commonly known as:  ZOFRAN Take 1 tablet (4 mg total) by mouth every 8 (eight) hours as needed for nausea or vomiting.   pantoprazole 40 MG tablet Commonly known as:  PROTONIX Take 40 mg by mouth 2 (two) times daily.   polyethylene  glycol powder powder Commonly known as:  GLYCOLAX/MIRALAX Take 17-34 g by mouth daily.   simvastatin 20 MG tablet Commonly known as:  ZOCOR Take 20 mg by mouth daily.   VENTOLIN HFA 108 (90 Base) MCG/ACT inhaler Generic drug:  albuterol Inhale 1-2 puffs into the lungs every 6 (six) hours as needed for wheezing or shortness of breath.   VITAMIN D-3 PO Take 1 capsule by mouth daily.        Signed: Arta Bruce [name] 05/06/2017, 7:33 AM

## 2017-05-12 DIAGNOSIS — F329 Major depressive disorder, single episode, unspecified: Secondary | ICD-10-CM | POA: Diagnosis not present

## 2017-05-12 DIAGNOSIS — Z85828 Personal history of other malignant neoplasm of skin: Secondary | ICD-10-CM | POA: Diagnosis not present

## 2017-05-12 DIAGNOSIS — Z5111 Encounter for antineoplastic chemotherapy: Secondary | ICD-10-CM | POA: Diagnosis not present

## 2017-05-12 DIAGNOSIS — C569 Malignant neoplasm of unspecified ovary: Secondary | ICD-10-CM | POA: Diagnosis not present

## 2017-05-12 DIAGNOSIS — E785 Hyperlipidemia, unspecified: Secondary | ICD-10-CM | POA: Diagnosis not present

## 2017-05-13 DIAGNOSIS — F329 Major depressive disorder, single episode, unspecified: Secondary | ICD-10-CM | POA: Diagnosis not present

## 2017-05-13 DIAGNOSIS — Z85828 Personal history of other malignant neoplasm of skin: Secondary | ICD-10-CM | POA: Diagnosis not present

## 2017-05-13 DIAGNOSIS — Z8719 Personal history of other diseases of the digestive system: Secondary | ICD-10-CM | POA: Diagnosis not present

## 2017-05-13 DIAGNOSIS — C799 Secondary malignant neoplasm of unspecified site: Secondary | ICD-10-CM | POA: Diagnosis not present

## 2017-05-13 DIAGNOSIS — Z5111 Encounter for antineoplastic chemotherapy: Secondary | ICD-10-CM | POA: Diagnosis not present

## 2017-05-13 DIAGNOSIS — Z9079 Acquired absence of other genital organ(s): Secondary | ICD-10-CM | POA: Diagnosis not present

## 2017-05-13 DIAGNOSIS — Z90722 Acquired absence of ovaries, bilateral: Secondary | ICD-10-CM | POA: Diagnosis not present

## 2017-05-13 DIAGNOSIS — E785 Hyperlipidemia, unspecified: Secondary | ICD-10-CM | POA: Diagnosis not present

## 2017-05-13 DIAGNOSIS — C569 Malignant neoplasm of unspecified ovary: Secondary | ICD-10-CM | POA: Diagnosis not present

## 2017-05-13 DIAGNOSIS — Z90710 Acquired absence of both cervix and uterus: Secondary | ICD-10-CM | POA: Diagnosis not present

## 2017-05-14 DIAGNOSIS — K7689 Other specified diseases of liver: Secondary | ICD-10-CM | POA: Diagnosis not present

## 2017-05-14 DIAGNOSIS — K449 Diaphragmatic hernia without obstruction or gangrene: Secondary | ICD-10-CM | POA: Diagnosis not present

## 2017-05-14 DIAGNOSIS — C561 Malignant neoplasm of right ovary: Secondary | ICD-10-CM | POA: Diagnosis not present

## 2017-05-14 DIAGNOSIS — M799 Soft tissue disorder, unspecified: Secondary | ICD-10-CM | POA: Diagnosis not present

## 2017-05-14 DIAGNOSIS — R590 Localized enlarged lymph nodes: Secondary | ICD-10-CM | POA: Diagnosis not present

## 2017-05-14 DIAGNOSIS — Z8719 Personal history of other diseases of the digestive system: Secondary | ICD-10-CM | POA: Diagnosis not present

## 2017-05-20 DIAGNOSIS — Z85828 Personal history of other malignant neoplasm of skin: Secondary | ICD-10-CM | POA: Diagnosis not present

## 2017-05-20 DIAGNOSIS — Z5111 Encounter for antineoplastic chemotherapy: Secondary | ICD-10-CM | POA: Diagnosis not present

## 2017-05-20 DIAGNOSIS — C569 Malignant neoplasm of unspecified ovary: Secondary | ICD-10-CM | POA: Diagnosis not present

## 2017-05-20 DIAGNOSIS — E785 Hyperlipidemia, unspecified: Secondary | ICD-10-CM | POA: Diagnosis not present

## 2017-05-20 DIAGNOSIS — F329 Major depressive disorder, single episode, unspecified: Secondary | ICD-10-CM | POA: Diagnosis not present

## 2017-06-09 DIAGNOSIS — Z85828 Personal history of other malignant neoplasm of skin: Secondary | ICD-10-CM | POA: Diagnosis not present

## 2017-06-09 DIAGNOSIS — E785 Hyperlipidemia, unspecified: Secondary | ICD-10-CM | POA: Diagnosis not present

## 2017-06-09 DIAGNOSIS — Z5111 Encounter for antineoplastic chemotherapy: Secondary | ICD-10-CM | POA: Diagnosis not present

## 2017-06-09 DIAGNOSIS — C569 Malignant neoplasm of unspecified ovary: Secondary | ICD-10-CM | POA: Diagnosis not present

## 2017-06-09 DIAGNOSIS — F329 Major depressive disorder, single episode, unspecified: Secondary | ICD-10-CM | POA: Diagnosis not present

## 2017-06-10 DIAGNOSIS — E785 Hyperlipidemia, unspecified: Secondary | ICD-10-CM | POA: Diagnosis not present

## 2017-06-10 DIAGNOSIS — Z85828 Personal history of other malignant neoplasm of skin: Secondary | ICD-10-CM | POA: Diagnosis not present

## 2017-06-10 DIAGNOSIS — Z8719 Personal history of other diseases of the digestive system: Secondary | ICD-10-CM | POA: Diagnosis not present

## 2017-06-10 DIAGNOSIS — F329 Major depressive disorder, single episode, unspecified: Secondary | ICD-10-CM | POA: Diagnosis not present

## 2017-06-10 DIAGNOSIS — Z9079 Acquired absence of other genital organ(s): Secondary | ICD-10-CM | POA: Diagnosis not present

## 2017-06-10 DIAGNOSIS — C569 Malignant neoplasm of unspecified ovary: Secondary | ICD-10-CM | POA: Diagnosis not present

## 2017-06-10 DIAGNOSIS — Z5111 Encounter for antineoplastic chemotherapy: Secondary | ICD-10-CM | POA: Diagnosis not present

## 2017-06-10 DIAGNOSIS — Z90710 Acquired absence of both cervix and uterus: Secondary | ICD-10-CM | POA: Diagnosis not present

## 2017-06-10 DIAGNOSIS — Z90722 Acquired absence of ovaries, bilateral: Secondary | ICD-10-CM | POA: Diagnosis not present

## 2017-06-10 DIAGNOSIS — C799 Secondary malignant neoplasm of unspecified site: Secondary | ICD-10-CM | POA: Diagnosis not present

## 2017-06-14 ENCOUNTER — Other Ambulatory Visit: Payer: Self-pay | Admitting: Obstetrics and Gynecology

## 2017-06-14 DIAGNOSIS — Z1231 Encounter for screening mammogram for malignant neoplasm of breast: Secondary | ICD-10-CM

## 2017-06-30 ENCOUNTER — Ambulatory Visit: Payer: Medicare Other

## 2017-07-06 IMAGING — MG MM DIAG BREAST TOMO UNI RIGHT
4 series · 4 of 12 positions shown · non-contrast
Comparison: 03/07/2015 and prior mammograms dating back to
10/24/2010

CLINICAL DATA: 69-year-old female with possible right breast mass
on screening mammogram.

EXAM:
DIGITAL DIAGNOSTIC RIGHT MAMMOGRAM WITH 3D TOMOSYNTHESIS
ULTRASOUND RIGHT BREAST

[R CC]
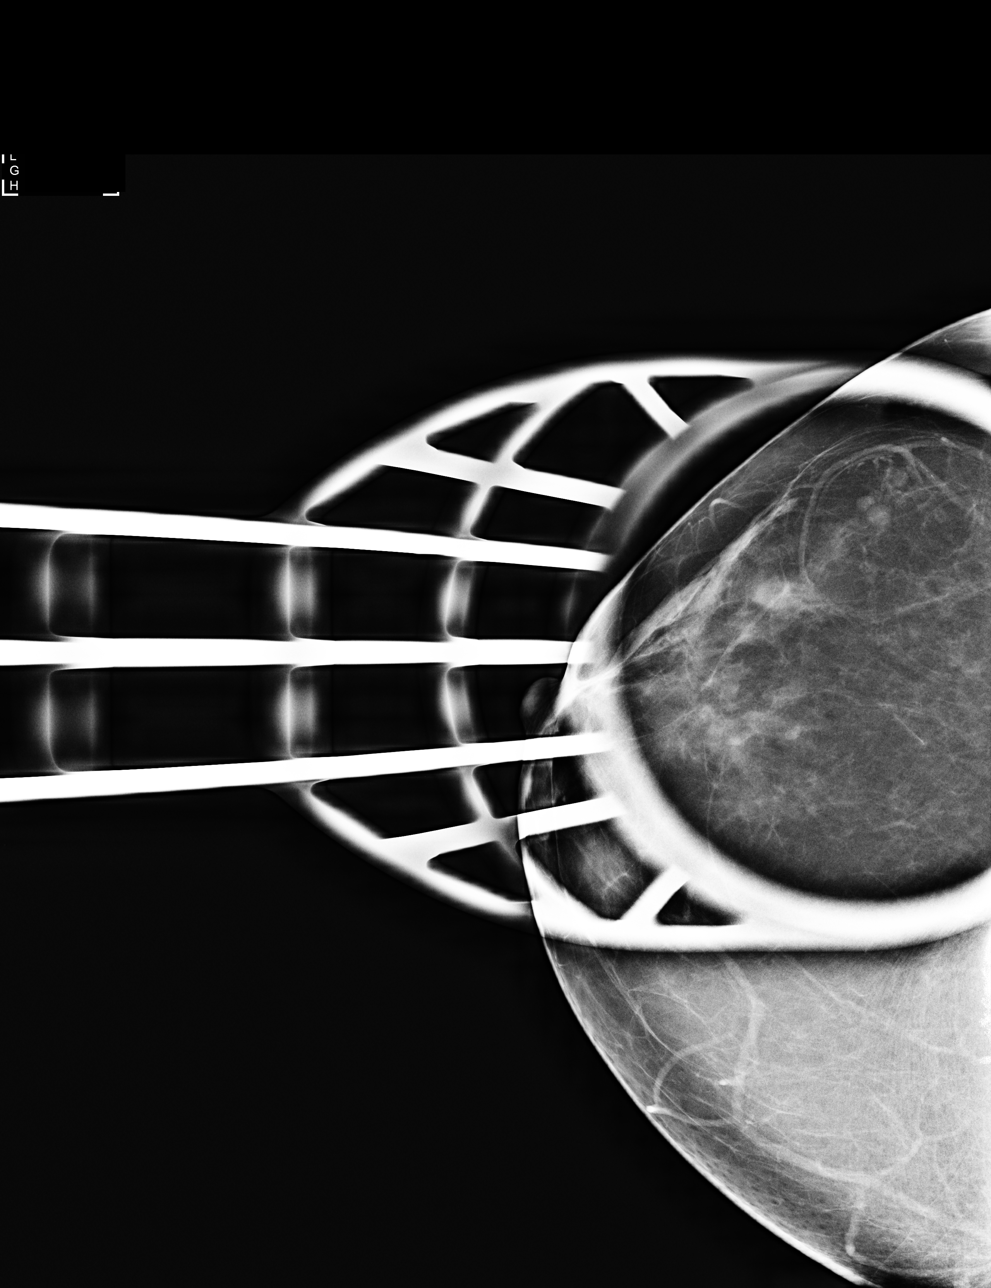

[R MLO]
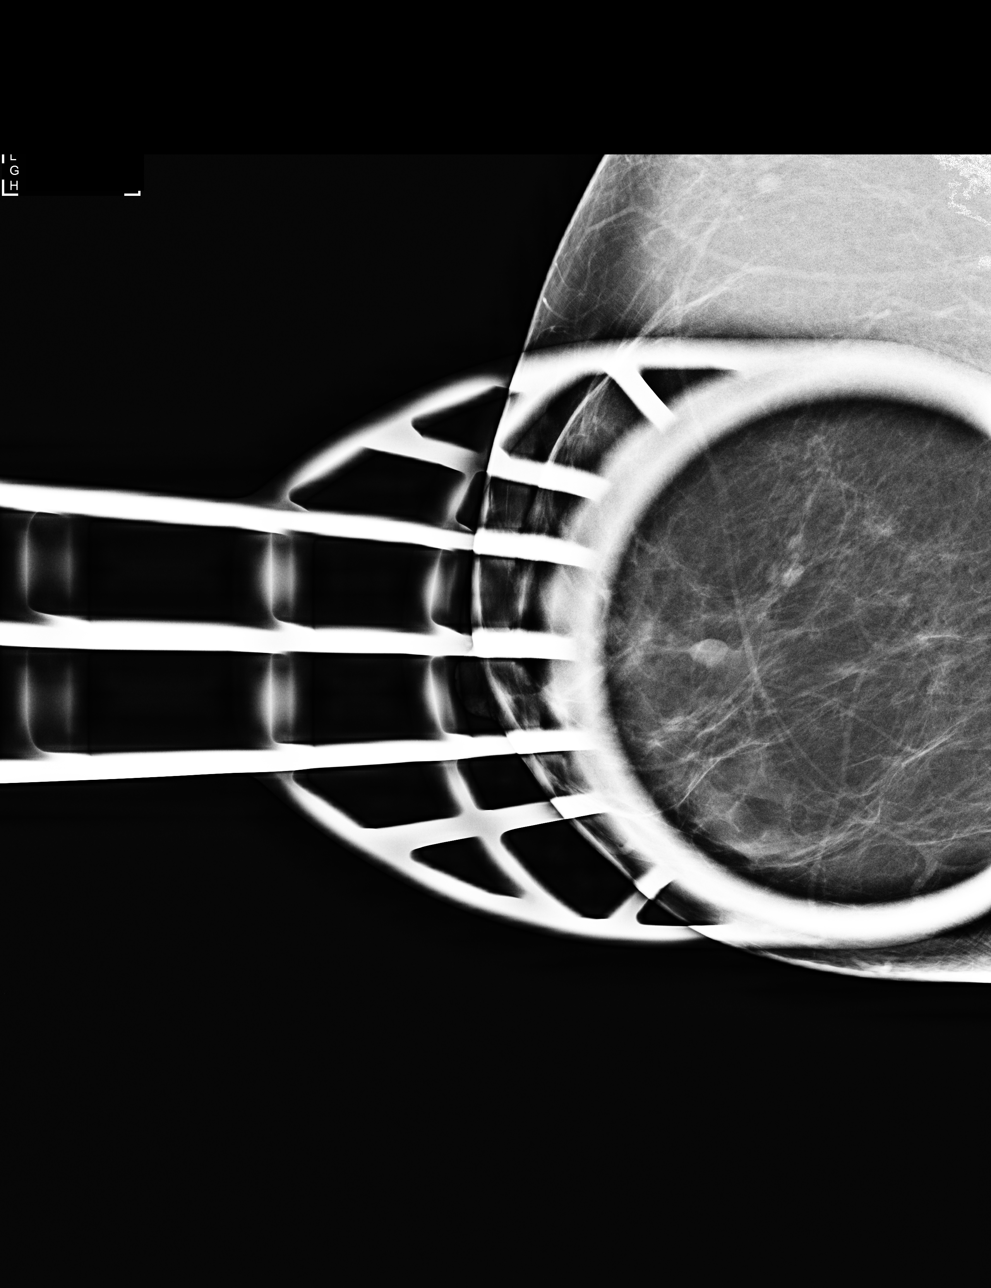

[R MLO tomo · tomo slice 29/57.0]
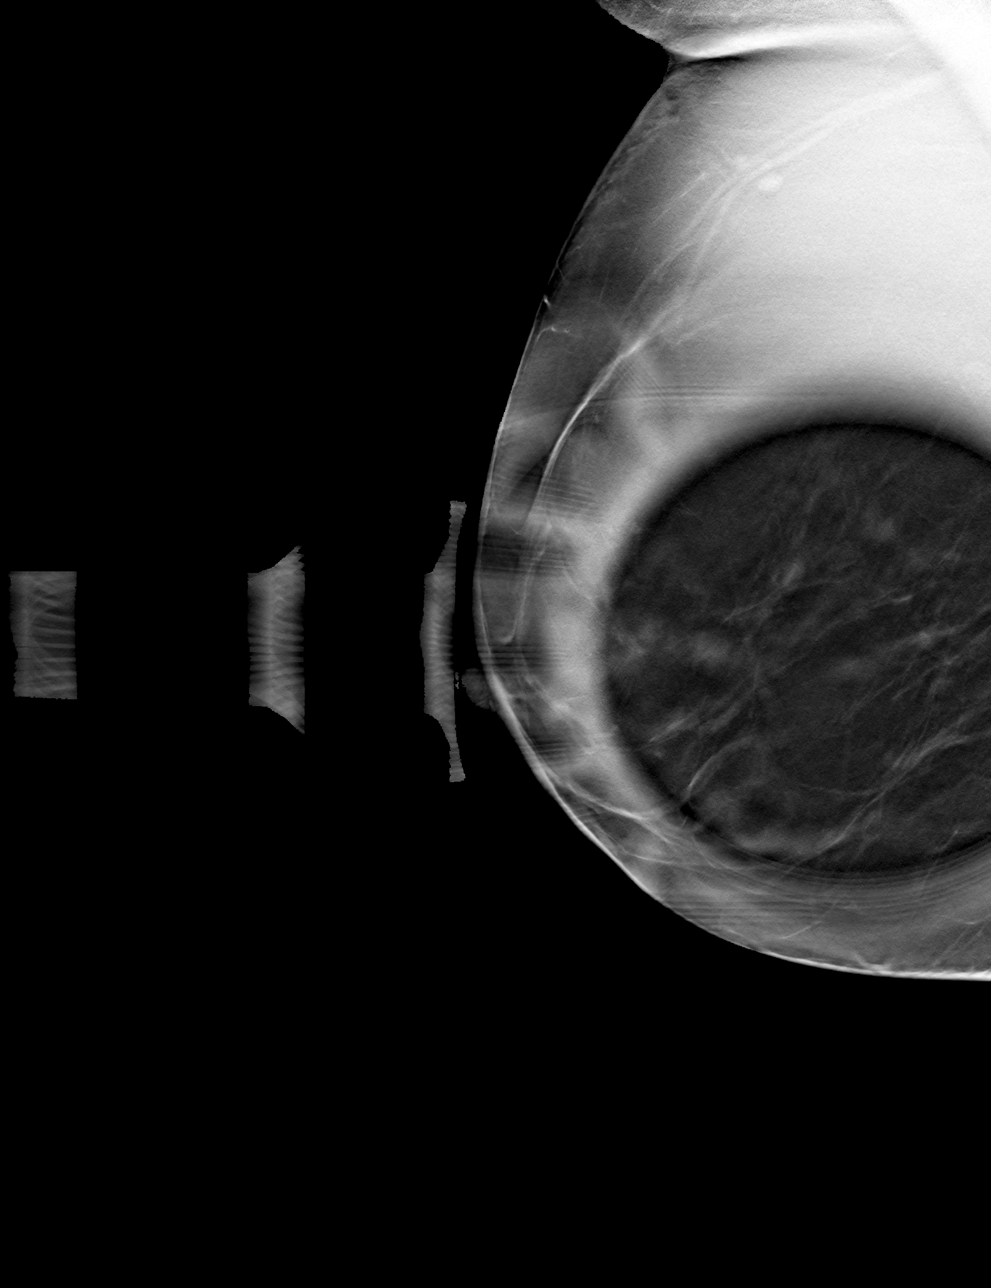

[R CC tomo · tomo slice 25/48.0]
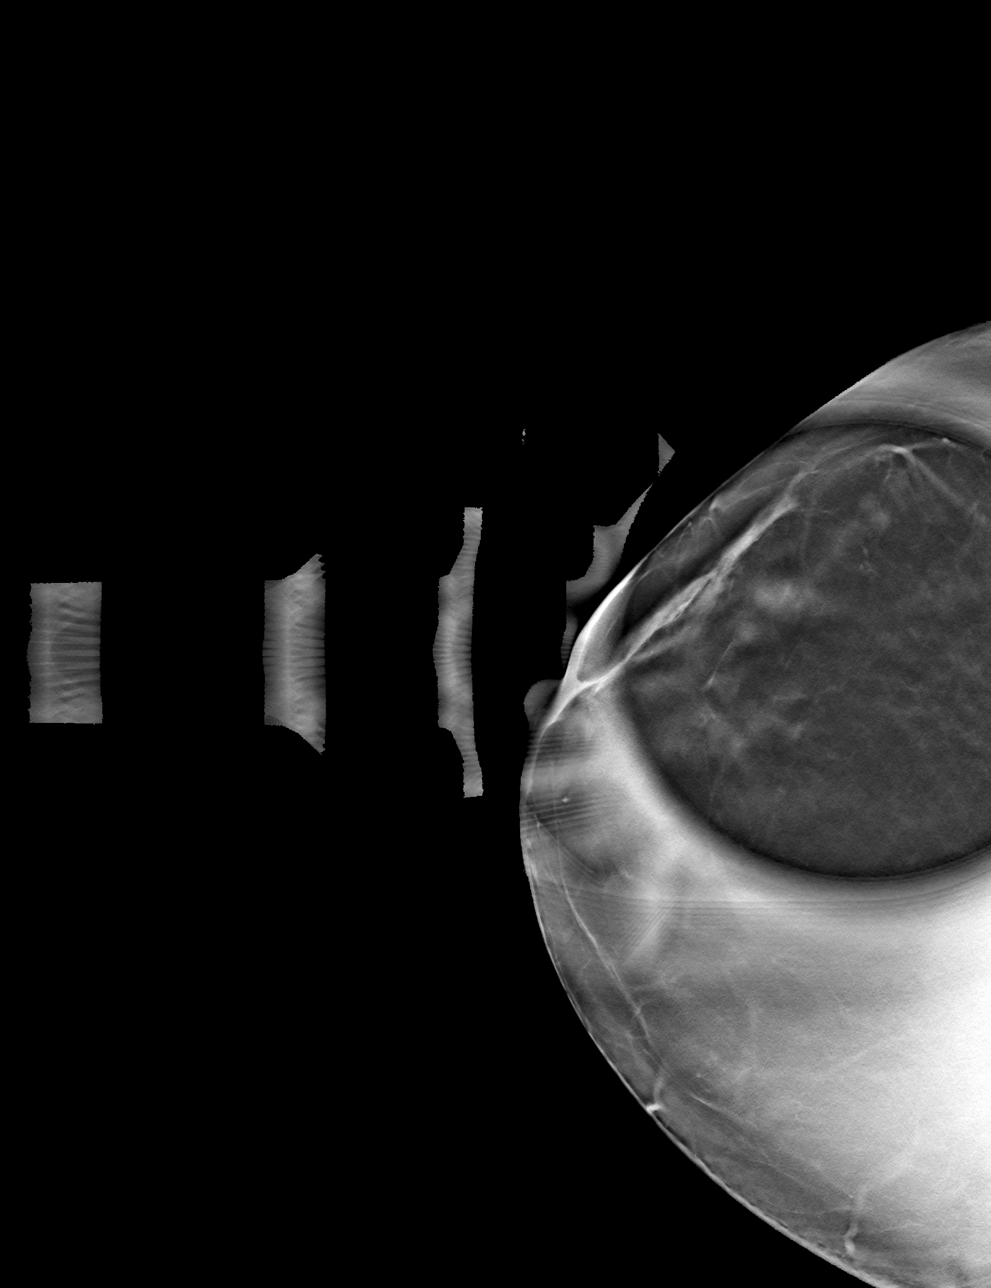

[4 of 12 positions shown; findings below may reference images not displayed]

ACR Breast Density Category b: There are scattered areas of
fibroglandular density.
FINDINGS: Spot scratch de 2D and 3D spot compression views of the right breast
demonstrate a 5 mm circumscribed oval mass within the lower outer
right breast.

On physical exam, no palpable abnormalities are identified in the
lower or outer right breast.

Targeted ultrasound is performed, showing a 5 x 4 x 5 mm benign
simple cyst at the 8 o'clock position of the right breast 3 cm from
the nipple, corresponding to the screening study finding.
IMPRESSION: Benign simple cyst in the lower outer right breast corresponding to
the screening study finding.

RECOMMENDATION:
Bilateral screening mammograms in 1 year.

I have discussed the findings and recommendations with the patient.
Results were also provided in writing at the conclusion of the
visit. If applicable, a reminder letter will be sent to the patient
regarding the next appointment.

BI-RADS CATEGORY  2: Benign.

## 2017-07-07 DIAGNOSIS — I10 Essential (primary) hypertension: Secondary | ICD-10-CM | POA: Diagnosis not present

## 2017-07-07 DIAGNOSIS — Z85828 Personal history of other malignant neoplasm of skin: Secondary | ICD-10-CM | POA: Diagnosis not present

## 2017-07-07 DIAGNOSIS — F329 Major depressive disorder, single episode, unspecified: Secondary | ICD-10-CM | POA: Diagnosis not present

## 2017-07-07 DIAGNOSIS — C569 Malignant neoplasm of unspecified ovary: Secondary | ICD-10-CM | POA: Diagnosis not present

## 2017-07-07 DIAGNOSIS — J309 Allergic rhinitis, unspecified: Secondary | ICD-10-CM | POA: Diagnosis not present

## 2017-07-07 DIAGNOSIS — E785 Hyperlipidemia, unspecified: Secondary | ICD-10-CM | POA: Diagnosis not present

## 2017-07-07 DIAGNOSIS — Z5111 Encounter for antineoplastic chemotherapy: Secondary | ICD-10-CM | POA: Diagnosis not present

## 2017-07-08 DIAGNOSIS — Z5111 Encounter for antineoplastic chemotherapy: Secondary | ICD-10-CM | POA: Diagnosis not present

## 2017-07-08 DIAGNOSIS — J309 Allergic rhinitis, unspecified: Secondary | ICD-10-CM | POA: Diagnosis not present

## 2017-07-08 DIAGNOSIS — F329 Major depressive disorder, single episode, unspecified: Secondary | ICD-10-CM | POA: Diagnosis not present

## 2017-07-08 DIAGNOSIS — C569 Malignant neoplasm of unspecified ovary: Secondary | ICD-10-CM | POA: Diagnosis not present

## 2017-07-08 DIAGNOSIS — E785 Hyperlipidemia, unspecified: Secondary | ICD-10-CM | POA: Diagnosis not present

## 2017-07-08 DIAGNOSIS — I10 Essential (primary) hypertension: Secondary | ICD-10-CM | POA: Diagnosis not present

## 2017-07-14 ENCOUNTER — Ambulatory Visit: Payer: Medicare Other

## 2017-07-15 DIAGNOSIS — M79661 Pain in right lower leg: Secondary | ICD-10-CM | POA: Diagnosis not present

## 2017-07-15 DIAGNOSIS — J111 Influenza due to unidentified influenza virus with other respiratory manifestations: Secondary | ICD-10-CM | POA: Diagnosis not present

## 2017-07-15 DIAGNOSIS — J0100 Acute maxillary sinusitis, unspecified: Secondary | ICD-10-CM | POA: Diagnosis not present

## 2017-07-15 DIAGNOSIS — J209 Acute bronchitis, unspecified: Secondary | ICD-10-CM | POA: Diagnosis not present

## 2017-07-26 DIAGNOSIS — B370 Candidal stomatitis: Secondary | ICD-10-CM | POA: Diagnosis not present

## 2017-07-26 DIAGNOSIS — J181 Lobar pneumonia, unspecified organism: Secondary | ICD-10-CM | POA: Diagnosis not present

## 2017-08-09 DIAGNOSIS — D649 Anemia, unspecified: Secondary | ICD-10-CM | POA: Diagnosis not present

## 2017-08-09 DIAGNOSIS — F329 Major depressive disorder, single episode, unspecified: Secondary | ICD-10-CM | POA: Diagnosis not present

## 2017-08-09 DIAGNOSIS — Z5111 Encounter for antineoplastic chemotherapy: Secondary | ICD-10-CM | POA: Diagnosis not present

## 2017-08-09 DIAGNOSIS — Z9079 Acquired absence of other genital organ(s): Secondary | ICD-10-CM | POA: Diagnosis not present

## 2017-08-09 DIAGNOSIS — T451X5D Adverse effect of antineoplastic and immunosuppressive drugs, subsequent encounter: Secondary | ICD-10-CM | POA: Diagnosis not present

## 2017-08-09 DIAGNOSIS — R809 Proteinuria, unspecified: Secondary | ICD-10-CM | POA: Diagnosis not present

## 2017-08-09 DIAGNOSIS — Z90710 Acquired absence of both cervix and uterus: Secondary | ICD-10-CM | POA: Diagnosis not present

## 2017-08-09 DIAGNOSIS — I890 Lymphedema, not elsewhere classified: Secondary | ICD-10-CM | POA: Diagnosis not present

## 2017-08-09 DIAGNOSIS — C784 Secondary malignant neoplasm of small intestine: Secondary | ICD-10-CM | POA: Diagnosis not present

## 2017-08-09 DIAGNOSIS — C569 Malignant neoplasm of unspecified ovary: Secondary | ICD-10-CM | POA: Diagnosis not present

## 2017-08-09 DIAGNOSIS — Z90722 Acquired absence of ovaries, bilateral: Secondary | ICD-10-CM | POA: Diagnosis not present

## 2017-08-09 DIAGNOSIS — I10 Essential (primary) hypertension: Secondary | ICD-10-CM | POA: Diagnosis not present

## 2017-08-09 DIAGNOSIS — R971 Elevated cancer antigen 125 [CA 125]: Secondary | ICD-10-CM | POA: Diagnosis not present

## 2017-08-09 DIAGNOSIS — N289 Disorder of kidney and ureter, unspecified: Secondary | ICD-10-CM | POA: Diagnosis not present

## 2017-08-09 DIAGNOSIS — Z85828 Personal history of other malignant neoplasm of skin: Secondary | ICD-10-CM | POA: Diagnosis not present

## 2017-08-09 DIAGNOSIS — I158 Other secondary hypertension: Secondary | ICD-10-CM | POA: Diagnosis not present

## 2017-08-10 ENCOUNTER — Ambulatory Visit: Payer: Medicare Other

## 2017-08-12 DIAGNOSIS — Z5111 Encounter for antineoplastic chemotherapy: Secondary | ICD-10-CM | POA: Diagnosis not present

## 2017-08-12 DIAGNOSIS — D649 Anemia, unspecified: Secondary | ICD-10-CM | POA: Diagnosis not present

## 2017-08-12 DIAGNOSIS — F329 Major depressive disorder, single episode, unspecified: Secondary | ICD-10-CM | POA: Diagnosis not present

## 2017-08-12 DIAGNOSIS — I10 Essential (primary) hypertension: Secondary | ICD-10-CM | POA: Diagnosis not present

## 2017-08-12 DIAGNOSIS — C569 Malignant neoplasm of unspecified ovary: Secondary | ICD-10-CM | POA: Diagnosis not present

## 2017-08-12 DIAGNOSIS — N289 Disorder of kidney and ureter, unspecified: Secondary | ICD-10-CM | POA: Diagnosis not present

## 2017-08-24 DIAGNOSIS — F324 Major depressive disorder, single episode, in partial remission: Secondary | ICD-10-CM | POA: Diagnosis not present

## 2017-08-24 DIAGNOSIS — C569 Malignant neoplasm of unspecified ovary: Secondary | ICD-10-CM | POA: Diagnosis not present

## 2017-08-24 DIAGNOSIS — K219 Gastro-esophageal reflux disease without esophagitis: Secondary | ICD-10-CM | POA: Diagnosis not present

## 2017-08-24 DIAGNOSIS — I10 Essential (primary) hypertension: Secondary | ICD-10-CM | POA: Diagnosis not present

## 2017-08-24 DIAGNOSIS — J189 Pneumonia, unspecified organism: Secondary | ICD-10-CM | POA: Diagnosis not present

## 2017-08-24 DIAGNOSIS — I890 Lymphedema, not elsewhere classified: Secondary | ICD-10-CM | POA: Diagnosis not present

## 2017-08-24 DIAGNOSIS — E782 Mixed hyperlipidemia: Secondary | ICD-10-CM | POA: Diagnosis not present

## 2017-08-24 DIAGNOSIS — F411 Generalized anxiety disorder: Secondary | ICD-10-CM | POA: Diagnosis not present

## 2017-08-24 DIAGNOSIS — N182 Chronic kidney disease, stage 2 (mild): Secondary | ICD-10-CM | POA: Diagnosis not present

## 2017-08-30 ENCOUNTER — Ambulatory Visit: Payer: Medicare Other

## 2017-08-30 DIAGNOSIS — I10 Essential (primary) hypertension: Secondary | ICD-10-CM | POA: Diagnosis not present

## 2017-08-30 DIAGNOSIS — F329 Major depressive disorder, single episode, unspecified: Secondary | ICD-10-CM | POA: Diagnosis not present

## 2017-08-30 DIAGNOSIS — Z90722 Acquired absence of ovaries, bilateral: Secondary | ICD-10-CM | POA: Diagnosis not present

## 2017-08-30 DIAGNOSIS — R971 Elevated cancer antigen 125 [CA 125]: Secondary | ICD-10-CM | POA: Diagnosis not present

## 2017-08-30 DIAGNOSIS — C569 Malignant neoplasm of unspecified ovary: Secondary | ICD-10-CM | POA: Diagnosis not present

## 2017-08-30 DIAGNOSIS — Z90710 Acquired absence of both cervix and uterus: Secondary | ICD-10-CM | POA: Diagnosis not present

## 2017-08-30 DIAGNOSIS — R809 Proteinuria, unspecified: Secondary | ICD-10-CM | POA: Diagnosis not present

## 2017-08-30 DIAGNOSIS — Z5111 Encounter for antineoplastic chemotherapy: Secondary | ICD-10-CM | POA: Diagnosis not present

## 2017-08-30 DIAGNOSIS — C784 Secondary malignant neoplasm of small intestine: Secondary | ICD-10-CM | POA: Diagnosis not present

## 2017-08-30 DIAGNOSIS — D649 Anemia, unspecified: Secondary | ICD-10-CM | POA: Diagnosis not present

## 2017-08-30 DIAGNOSIS — Z85828 Personal history of other malignant neoplasm of skin: Secondary | ICD-10-CM | POA: Diagnosis not present

## 2017-08-30 DIAGNOSIS — T451X5D Adverse effect of antineoplastic and immunosuppressive drugs, subsequent encounter: Secondary | ICD-10-CM | POA: Diagnosis not present

## 2017-08-30 DIAGNOSIS — I158 Other secondary hypertension: Secondary | ICD-10-CM | POA: Diagnosis not present

## 2017-08-30 DIAGNOSIS — N289 Disorder of kidney and ureter, unspecified: Secondary | ICD-10-CM | POA: Diagnosis not present

## 2017-09-02 DIAGNOSIS — Z5111 Encounter for antineoplastic chemotherapy: Secondary | ICD-10-CM | POA: Diagnosis not present

## 2017-09-02 DIAGNOSIS — F329 Major depressive disorder, single episode, unspecified: Secondary | ICD-10-CM | POA: Diagnosis not present

## 2017-09-02 DIAGNOSIS — D649 Anemia, unspecified: Secondary | ICD-10-CM | POA: Diagnosis not present

## 2017-09-02 DIAGNOSIS — C569 Malignant neoplasm of unspecified ovary: Secondary | ICD-10-CM | POA: Diagnosis not present

## 2017-09-02 DIAGNOSIS — I10 Essential (primary) hypertension: Secondary | ICD-10-CM | POA: Diagnosis not present

## 2017-09-02 DIAGNOSIS — N289 Disorder of kidney and ureter, unspecified: Secondary | ICD-10-CM | POA: Diagnosis not present

## 2017-09-07 ENCOUNTER — Inpatient Hospital Stay: Admission: RE | Admit: 2017-09-07 | Payer: Medicare Other | Source: Ambulatory Visit

## 2017-09-22 DIAGNOSIS — D509 Iron deficiency anemia, unspecified: Secondary | ICD-10-CM | POA: Diagnosis not present

## 2017-09-22 DIAGNOSIS — T451X5A Adverse effect of antineoplastic and immunosuppressive drugs, initial encounter: Secondary | ICD-10-CM | POA: Diagnosis not present

## 2017-09-22 DIAGNOSIS — Z5111 Encounter for antineoplastic chemotherapy: Secondary | ICD-10-CM | POA: Diagnosis not present

## 2017-09-22 DIAGNOSIS — D6481 Anemia due to antineoplastic chemotherapy: Secondary | ICD-10-CM | POA: Diagnosis not present

## 2017-09-22 DIAGNOSIS — D638 Anemia in other chronic diseases classified elsewhere: Secondary | ICD-10-CM | POA: Diagnosis not present

## 2017-09-22 DIAGNOSIS — C569 Malignant neoplasm of unspecified ovary: Secondary | ICD-10-CM | POA: Diagnosis not present

## 2017-09-23 DIAGNOSIS — F329 Major depressive disorder, single episode, unspecified: Secondary | ICD-10-CM | POA: Diagnosis not present

## 2017-09-23 DIAGNOSIS — D638 Anemia in other chronic diseases classified elsewhere: Secondary | ICD-10-CM | POA: Diagnosis not present

## 2017-09-23 DIAGNOSIS — Z90710 Acquired absence of both cervix and uterus: Secondary | ICD-10-CM | POA: Diagnosis not present

## 2017-09-23 DIAGNOSIS — D6481 Anemia due to antineoplastic chemotherapy: Secondary | ICD-10-CM | POA: Diagnosis not present

## 2017-09-23 DIAGNOSIS — I10 Essential (primary) hypertension: Secondary | ICD-10-CM | POA: Diagnosis not present

## 2017-09-23 DIAGNOSIS — C772 Secondary and unspecified malignant neoplasm of intra-abdominal lymph nodes: Secondary | ICD-10-CM | POA: Diagnosis not present

## 2017-09-23 DIAGNOSIS — T451X5D Adverse effect of antineoplastic and immunosuppressive drugs, subsequent encounter: Secondary | ICD-10-CM | POA: Diagnosis not present

## 2017-09-23 DIAGNOSIS — T451X5A Adverse effect of antineoplastic and immunosuppressive drugs, initial encounter: Secondary | ICD-10-CM | POA: Diagnosis not present

## 2017-09-23 DIAGNOSIS — C786 Secondary malignant neoplasm of retroperitoneum and peritoneum: Secondary | ICD-10-CM | POA: Diagnosis not present

## 2017-09-23 DIAGNOSIS — Z5111 Encounter for antineoplastic chemotherapy: Secondary | ICD-10-CM | POA: Diagnosis not present

## 2017-09-23 DIAGNOSIS — D509 Iron deficiency anemia, unspecified: Secondary | ICD-10-CM | POA: Diagnosis not present

## 2017-09-23 DIAGNOSIS — D630 Anemia in neoplastic disease: Secondary | ICD-10-CM | POA: Diagnosis not present

## 2017-09-23 DIAGNOSIS — N289 Disorder of kidney and ureter, unspecified: Secondary | ICD-10-CM | POA: Diagnosis not present

## 2017-09-23 DIAGNOSIS — Z85828 Personal history of other malignant neoplasm of skin: Secondary | ICD-10-CM | POA: Diagnosis not present

## 2017-09-23 DIAGNOSIS — C569 Malignant neoplasm of unspecified ovary: Secondary | ICD-10-CM | POA: Diagnosis not present

## 2017-09-27 DIAGNOSIS — D630 Anemia in neoplastic disease: Secondary | ICD-10-CM | POA: Diagnosis not present

## 2017-09-27 DIAGNOSIS — C569 Malignant neoplasm of unspecified ovary: Secondary | ICD-10-CM | POA: Diagnosis not present

## 2017-10-13 DIAGNOSIS — Z87891 Personal history of nicotine dependence: Secondary | ICD-10-CM | POA: Diagnosis not present

## 2017-10-13 DIAGNOSIS — M549 Dorsalgia, unspecified: Secondary | ICD-10-CM | POA: Diagnosis not present

## 2017-10-13 DIAGNOSIS — C569 Malignant neoplasm of unspecified ovary: Secondary | ICD-10-CM | POA: Diagnosis not present

## 2017-10-13 DIAGNOSIS — D630 Anemia in neoplastic disease: Secondary | ICD-10-CM | POA: Diagnosis not present

## 2017-10-13 DIAGNOSIS — J449 Chronic obstructive pulmonary disease, unspecified: Secondary | ICD-10-CM | POA: Diagnosis not present

## 2017-10-13 DIAGNOSIS — I10 Essential (primary) hypertension: Secondary | ICD-10-CM | POA: Diagnosis not present

## 2017-10-13 DIAGNOSIS — Z5111 Encounter for antineoplastic chemotherapy: Secondary | ICD-10-CM | POA: Diagnosis not present

## 2017-10-13 DIAGNOSIS — E785 Hyperlipidemia, unspecified: Secondary | ICD-10-CM | POA: Diagnosis not present

## 2017-10-13 DIAGNOSIS — G8929 Other chronic pain: Secondary | ICD-10-CM | POA: Diagnosis not present

## 2017-10-13 DIAGNOSIS — Z90722 Acquired absence of ovaries, bilateral: Secondary | ICD-10-CM | POA: Diagnosis not present

## 2017-10-13 DIAGNOSIS — Z90710 Acquired absence of both cervix and uterus: Secondary | ICD-10-CM | POA: Diagnosis not present

## 2017-10-13 DIAGNOSIS — D649 Anemia, unspecified: Secondary | ICD-10-CM | POA: Diagnosis not present

## 2017-10-13 DIAGNOSIS — Z85828 Personal history of other malignant neoplasm of skin: Secondary | ICD-10-CM | POA: Diagnosis not present

## 2017-10-13 DIAGNOSIS — C562 Malignant neoplasm of left ovary: Secondary | ICD-10-CM | POA: Diagnosis not present

## 2017-10-14 DIAGNOSIS — Z5111 Encounter for antineoplastic chemotherapy: Secondary | ICD-10-CM | POA: Diagnosis not present

## 2017-10-14 DIAGNOSIS — I10 Essential (primary) hypertension: Secondary | ICD-10-CM | POA: Diagnosis not present

## 2017-10-14 DIAGNOSIS — G8929 Other chronic pain: Secondary | ICD-10-CM | POA: Diagnosis not present

## 2017-10-14 DIAGNOSIS — Z90710 Acquired absence of both cervix and uterus: Secondary | ICD-10-CM | POA: Diagnosis not present

## 2017-10-14 DIAGNOSIS — D649 Anemia, unspecified: Secondary | ICD-10-CM | POA: Diagnosis not present

## 2017-10-14 DIAGNOSIS — D630 Anemia in neoplastic disease: Secondary | ICD-10-CM | POA: Diagnosis not present

## 2017-10-14 DIAGNOSIS — M549 Dorsalgia, unspecified: Secondary | ICD-10-CM | POA: Diagnosis not present

## 2017-10-14 DIAGNOSIS — Z9079 Acquired absence of other genital organ(s): Secondary | ICD-10-CM | POA: Diagnosis not present

## 2017-10-14 DIAGNOSIS — F329 Major depressive disorder, single episode, unspecified: Secondary | ICD-10-CM | POA: Diagnosis not present

## 2017-10-14 DIAGNOSIS — C569 Malignant neoplasm of unspecified ovary: Secondary | ICD-10-CM | POA: Diagnosis not present

## 2017-10-14 DIAGNOSIS — Z90722 Acquired absence of ovaries, bilateral: Secondary | ICD-10-CM | POA: Diagnosis not present

## 2017-10-14 DIAGNOSIS — C786 Secondary malignant neoplasm of retroperitoneum and peritoneum: Secondary | ICD-10-CM | POA: Diagnosis not present

## 2017-10-14 DIAGNOSIS — C562 Malignant neoplasm of left ovary: Secondary | ICD-10-CM | POA: Diagnosis not present

## 2017-10-14 DIAGNOSIS — Z9049 Acquired absence of other specified parts of digestive tract: Secondary | ICD-10-CM | POA: Diagnosis not present

## 2017-11-12 ENCOUNTER — Ambulatory Visit: Payer: Medicare Other

## 2017-11-15 ENCOUNTER — Ambulatory Visit: Payer: Medicare Other

## 2017-11-17 DIAGNOSIS — R590 Localized enlarged lymph nodes: Secondary | ICD-10-CM | POA: Diagnosis not present

## 2017-11-17 DIAGNOSIS — K449 Diaphragmatic hernia without obstruction or gangrene: Secondary | ICD-10-CM | POA: Diagnosis not present

## 2017-11-17 DIAGNOSIS — Z888 Allergy status to other drugs, medicaments and biological substances status: Secondary | ICD-10-CM | POA: Diagnosis not present

## 2017-11-17 DIAGNOSIS — Z880 Allergy status to penicillin: Secondary | ICD-10-CM | POA: Diagnosis not present

## 2017-11-17 DIAGNOSIS — C569 Malignant neoplasm of unspecified ovary: Secondary | ICD-10-CM | POA: Diagnosis not present

## 2017-11-17 DIAGNOSIS — Z881 Allergy status to other antibiotic agents status: Secondary | ICD-10-CM | POA: Diagnosis not present

## 2017-11-17 DIAGNOSIS — K760 Fatty (change of) liver, not elsewhere classified: Secondary | ICD-10-CM | POA: Diagnosis not present

## 2017-11-17 DIAGNOSIS — C562 Malignant neoplasm of left ovary: Secondary | ICD-10-CM | POA: Diagnosis not present

## 2017-11-18 DIAGNOSIS — Z5111 Encounter for antineoplastic chemotherapy: Secondary | ICD-10-CM | POA: Diagnosis not present

## 2017-11-18 DIAGNOSIS — F329 Major depressive disorder, single episode, unspecified: Secondary | ICD-10-CM | POA: Diagnosis not present

## 2017-11-18 DIAGNOSIS — Z9079 Acquired absence of other genital organ(s): Secondary | ICD-10-CM | POA: Diagnosis not present

## 2017-11-18 DIAGNOSIS — Z9049 Acquired absence of other specified parts of digestive tract: Secondary | ICD-10-CM | POA: Diagnosis not present

## 2017-11-18 DIAGNOSIS — I10 Essential (primary) hypertension: Secondary | ICD-10-CM | POA: Diagnosis not present

## 2017-11-18 DIAGNOSIS — Z90710 Acquired absence of both cervix and uterus: Secondary | ICD-10-CM | POA: Diagnosis not present

## 2017-11-18 DIAGNOSIS — D649 Anemia, unspecified: Secondary | ICD-10-CM | POA: Diagnosis not present

## 2017-11-18 DIAGNOSIS — Z9889 Other specified postprocedural states: Secondary | ICD-10-CM | POA: Diagnosis not present

## 2017-11-18 DIAGNOSIS — Z90722 Acquired absence of ovaries, bilateral: Secondary | ICD-10-CM | POA: Diagnosis not present

## 2017-11-18 DIAGNOSIS — C569 Malignant neoplasm of unspecified ovary: Secondary | ICD-10-CM | POA: Diagnosis not present

## 2017-11-18 DIAGNOSIS — Z8719 Personal history of other diseases of the digestive system: Secondary | ICD-10-CM | POA: Diagnosis not present

## 2017-11-18 DIAGNOSIS — Z85828 Personal history of other malignant neoplasm of skin: Secondary | ICD-10-CM | POA: Diagnosis not present

## 2017-11-19 DIAGNOSIS — D649 Anemia, unspecified: Secondary | ICD-10-CM | POA: Diagnosis not present

## 2017-11-19 DIAGNOSIS — Z5111 Encounter for antineoplastic chemotherapy: Secondary | ICD-10-CM | POA: Diagnosis not present

## 2017-11-19 DIAGNOSIS — Z8719 Personal history of other diseases of the digestive system: Secondary | ICD-10-CM | POA: Diagnosis not present

## 2017-11-19 DIAGNOSIS — F329 Major depressive disorder, single episode, unspecified: Secondary | ICD-10-CM | POA: Diagnosis not present

## 2017-11-19 DIAGNOSIS — C569 Malignant neoplasm of unspecified ovary: Secondary | ICD-10-CM | POA: Diagnosis not present

## 2017-12-15 DIAGNOSIS — Z85828 Personal history of other malignant neoplasm of skin: Secondary | ICD-10-CM | POA: Diagnosis not present

## 2017-12-15 DIAGNOSIS — Z8719 Personal history of other diseases of the digestive system: Secondary | ICD-10-CM | POA: Diagnosis not present

## 2017-12-15 DIAGNOSIS — Z5111 Encounter for antineoplastic chemotherapy: Secondary | ICD-10-CM | POA: Diagnosis not present

## 2017-12-15 DIAGNOSIS — D649 Anemia, unspecified: Secondary | ICD-10-CM | POA: Diagnosis not present

## 2017-12-15 DIAGNOSIS — Z9079 Acquired absence of other genital organ(s): Secondary | ICD-10-CM | POA: Diagnosis not present

## 2017-12-15 DIAGNOSIS — I10 Essential (primary) hypertension: Secondary | ICD-10-CM | POA: Diagnosis not present

## 2017-12-15 DIAGNOSIS — Z90722 Acquired absence of ovaries, bilateral: Secondary | ICD-10-CM | POA: Diagnosis not present

## 2017-12-15 DIAGNOSIS — F329 Major depressive disorder, single episode, unspecified: Secondary | ICD-10-CM | POA: Diagnosis not present

## 2017-12-15 DIAGNOSIS — Z90710 Acquired absence of both cervix and uterus: Secondary | ICD-10-CM | POA: Diagnosis not present

## 2017-12-15 DIAGNOSIS — C569 Malignant neoplasm of unspecified ovary: Secondary | ICD-10-CM | POA: Diagnosis not present

## 2017-12-16 DIAGNOSIS — Z5111 Encounter for antineoplastic chemotherapy: Secondary | ICD-10-CM | POA: Diagnosis not present

## 2017-12-16 DIAGNOSIS — F329 Major depressive disorder, single episode, unspecified: Secondary | ICD-10-CM | POA: Diagnosis not present

## 2017-12-16 DIAGNOSIS — D649 Anemia, unspecified: Secondary | ICD-10-CM | POA: Diagnosis not present

## 2017-12-16 DIAGNOSIS — I10 Essential (primary) hypertension: Secondary | ICD-10-CM | POA: Diagnosis not present

## 2017-12-16 DIAGNOSIS — C569 Malignant neoplasm of unspecified ovary: Secondary | ICD-10-CM | POA: Diagnosis not present

## 2017-12-16 DIAGNOSIS — Z85828 Personal history of other malignant neoplasm of skin: Secondary | ICD-10-CM | POA: Diagnosis not present

## 2017-12-23 DIAGNOSIS — E782 Mixed hyperlipidemia: Secondary | ICD-10-CM | POA: Diagnosis not present

## 2017-12-23 DIAGNOSIS — K1120 Sialoadenitis, unspecified: Secondary | ICD-10-CM | POA: Diagnosis not present

## 2017-12-23 DIAGNOSIS — K219 Gastro-esophageal reflux disease without esophagitis: Secondary | ICD-10-CM | POA: Diagnosis not present

## 2018-01-05 DIAGNOSIS — C569 Malignant neoplasm of unspecified ovary: Secondary | ICD-10-CM | POA: Diagnosis not present

## 2018-01-06 DIAGNOSIS — Z90710 Acquired absence of both cervix and uterus: Secondary | ICD-10-CM | POA: Diagnosis not present

## 2018-01-06 DIAGNOSIS — D649 Anemia, unspecified: Secondary | ICD-10-CM | POA: Diagnosis not present

## 2018-01-06 DIAGNOSIS — Z90722 Acquired absence of ovaries, bilateral: Secondary | ICD-10-CM | POA: Diagnosis not present

## 2018-01-06 DIAGNOSIS — C569 Malignant neoplasm of unspecified ovary: Secondary | ICD-10-CM | POA: Diagnosis not present

## 2018-01-06 DIAGNOSIS — Z9079 Acquired absence of other genital organ(s): Secondary | ICD-10-CM | POA: Diagnosis not present

## 2018-01-06 DIAGNOSIS — I10 Essential (primary) hypertension: Secondary | ICD-10-CM | POA: Diagnosis not present

## 2018-01-06 DIAGNOSIS — F329 Major depressive disorder, single episode, unspecified: Secondary | ICD-10-CM | POA: Diagnosis not present

## 2018-01-13 DIAGNOSIS — C569 Malignant neoplasm of unspecified ovary: Secondary | ICD-10-CM | POA: Diagnosis not present

## 2018-01-17 DIAGNOSIS — E538 Deficiency of other specified B group vitamins: Secondary | ICD-10-CM | POA: Diagnosis not present

## 2018-01-17 DIAGNOSIS — D611 Drug-induced aplastic anemia: Secondary | ICD-10-CM | POA: Diagnosis not present

## 2018-01-17 DIAGNOSIS — I10 Essential (primary) hypertension: Secondary | ICD-10-CM | POA: Diagnosis not present

## 2018-01-17 DIAGNOSIS — C569 Malignant neoplasm of unspecified ovary: Secondary | ICD-10-CM | POA: Diagnosis not present

## 2018-01-17 DIAGNOSIS — F329 Major depressive disorder, single episode, unspecified: Secondary | ICD-10-CM | POA: Diagnosis not present

## 2018-01-17 DIAGNOSIS — T451X5A Adverse effect of antineoplastic and immunosuppressive drugs, initial encounter: Secondary | ICD-10-CM | POA: Diagnosis not present

## 2018-01-17 DIAGNOSIS — Z87891 Personal history of nicotine dependence: Secondary | ICD-10-CM | POA: Diagnosis not present

## 2018-01-19 DIAGNOSIS — I10 Essential (primary) hypertension: Secondary | ICD-10-CM | POA: Diagnosis not present

## 2018-01-19 DIAGNOSIS — E785 Hyperlipidemia, unspecified: Secondary | ICD-10-CM | POA: Diagnosis not present

## 2018-01-19 DIAGNOSIS — R531 Weakness: Secondary | ICD-10-CM | POA: Diagnosis not present

## 2018-01-19 DIAGNOSIS — Z881 Allergy status to other antibiotic agents status: Secondary | ICD-10-CM | POA: Diagnosis not present

## 2018-01-19 DIAGNOSIS — Z79899 Other long term (current) drug therapy: Secondary | ICD-10-CM | POA: Diagnosis not present

## 2018-01-19 DIAGNOSIS — Z882 Allergy status to sulfonamides status: Secondary | ICD-10-CM | POA: Diagnosis not present

## 2018-01-19 DIAGNOSIS — N179 Acute kidney failure, unspecified: Secondary | ICD-10-CM | POA: Diagnosis not present

## 2018-01-19 DIAGNOSIS — I4581 Long QT syndrome: Secondary | ICD-10-CM | POA: Diagnosis not present

## 2018-01-19 DIAGNOSIS — Z7982 Long term (current) use of aspirin: Secondary | ICD-10-CM | POA: Diagnosis not present

## 2018-01-19 DIAGNOSIS — Z885 Allergy status to narcotic agent status: Secondary | ICD-10-CM | POA: Diagnosis not present

## 2018-01-19 DIAGNOSIS — Z87891 Personal history of nicotine dependence: Secondary | ICD-10-CM | POA: Diagnosis not present

## 2018-01-19 DIAGNOSIS — J449 Chronic obstructive pulmonary disease, unspecified: Secondary | ICD-10-CM | POA: Diagnosis not present

## 2018-01-19 DIAGNOSIS — E876 Hypokalemia: Secondary | ICD-10-CM | POA: Diagnosis not present

## 2018-01-19 DIAGNOSIS — C799 Secondary malignant neoplasm of unspecified site: Secondary | ICD-10-CM | POA: Diagnosis not present

## 2018-01-19 DIAGNOSIS — C569 Malignant neoplasm of unspecified ovary: Secondary | ICD-10-CM | POA: Diagnosis not present

## 2018-01-19 DIAGNOSIS — Z888 Allergy status to other drugs, medicaments and biological substances status: Secondary | ICD-10-CM | POA: Diagnosis not present

## 2018-01-19 DIAGNOSIS — Z880 Allergy status to penicillin: Secondary | ICD-10-CM | POA: Diagnosis not present

## 2018-01-20 DIAGNOSIS — E876 Hypokalemia: Secondary | ICD-10-CM | POA: Diagnosis not present

## 2018-01-25 DIAGNOSIS — Z87891 Personal history of nicotine dependence: Secondary | ICD-10-CM | POA: Diagnosis not present

## 2018-01-25 DIAGNOSIS — D649 Anemia, unspecified: Secondary | ICD-10-CM | POA: Diagnosis not present

## 2018-01-25 DIAGNOSIS — E538 Deficiency of other specified B group vitamins: Secondary | ICD-10-CM | POA: Diagnosis not present

## 2018-01-25 DIAGNOSIS — I10 Essential (primary) hypertension: Secondary | ICD-10-CM | POA: Diagnosis not present

## 2018-01-25 DIAGNOSIS — D611 Drug-induced aplastic anemia: Secondary | ICD-10-CM | POA: Diagnosis not present

## 2018-01-25 DIAGNOSIS — R5383 Other fatigue: Secondary | ICD-10-CM | POA: Diagnosis not present

## 2018-01-25 DIAGNOSIS — T451X5A Adverse effect of antineoplastic and immunosuppressive drugs, initial encounter: Secondary | ICD-10-CM | POA: Diagnosis not present

## 2018-01-25 DIAGNOSIS — C569 Malignant neoplasm of unspecified ovary: Secondary | ICD-10-CM | POA: Diagnosis not present

## 2018-01-26 DIAGNOSIS — E876 Hypokalemia: Secondary | ICD-10-CM | POA: Diagnosis not present

## 2018-01-31 ENCOUNTER — Ambulatory Visit
Admission: RE | Admit: 2018-01-31 | Discharge: 2018-01-31 | Disposition: A | Discharge status: Home / Self Care | Payer: Medicare Other | Source: Ambulatory Visit | Attending: Obstetrics and Gynecology | Admitting: Obstetrics and Gynecology

## 2018-01-31 DIAGNOSIS — Z1231 Encounter for screening mammogram for malignant neoplasm of breast: Secondary | ICD-10-CM

## 2018-01-31 HISTORY — DX: Personal history of antineoplastic chemotherapy: Z92.21

## 2018-02-01 DIAGNOSIS — I10 Essential (primary) hypertension: Secondary | ICD-10-CM | POA: Diagnosis not present

## 2018-02-01 DIAGNOSIS — E538 Deficiency of other specified B group vitamins: Secondary | ICD-10-CM | POA: Diagnosis not present

## 2018-02-01 DIAGNOSIS — C569 Malignant neoplasm of unspecified ovary: Secondary | ICD-10-CM | POA: Diagnosis not present

## 2018-02-01 DIAGNOSIS — T451X5A Adverse effect of antineoplastic and immunosuppressive drugs, initial encounter: Secondary | ICD-10-CM | POA: Diagnosis not present

## 2018-02-01 DIAGNOSIS — D611 Drug-induced aplastic anemia: Secondary | ICD-10-CM | POA: Diagnosis not present

## 2018-02-01 DIAGNOSIS — Z87891 Personal history of nicotine dependence: Secondary | ICD-10-CM | POA: Diagnosis not present

## 2018-02-07 ENCOUNTER — Ambulatory Visit: Payer: Medicare Other

## 2018-02-10 DIAGNOSIS — C569 Malignant neoplasm of unspecified ovary: Secondary | ICD-10-CM | POA: Diagnosis not present

## 2018-02-10 DIAGNOSIS — D649 Anemia, unspecified: Secondary | ICD-10-CM | POA: Diagnosis not present

## 2018-02-10 DIAGNOSIS — Z5111 Encounter for antineoplastic chemotherapy: Secondary | ICD-10-CM | POA: Diagnosis not present

## 2018-02-10 DIAGNOSIS — D611 Drug-induced aplastic anemia: Secondary | ICD-10-CM | POA: Diagnosis not present

## 2018-02-10 DIAGNOSIS — D509 Iron deficiency anemia, unspecified: Secondary | ICD-10-CM | POA: Diagnosis not present

## 2018-02-23 DIAGNOSIS — E782 Mixed hyperlipidemia: Secondary | ICD-10-CM | POA: Diagnosis not present

## 2018-02-23 DIAGNOSIS — F324 Major depressive disorder, single episode, in partial remission: Secondary | ICD-10-CM | POA: Diagnosis not present

## 2018-02-23 DIAGNOSIS — N182 Chronic kidney disease, stage 2 (mild): Secondary | ICD-10-CM | POA: Diagnosis not present

## 2018-02-23 DIAGNOSIS — I10 Essential (primary) hypertension: Secondary | ICD-10-CM | POA: Diagnosis not present

## 2018-02-23 DIAGNOSIS — K219 Gastro-esophageal reflux disease without esophagitis: Secondary | ICD-10-CM | POA: Diagnosis not present

## 2018-02-23 DIAGNOSIS — Z23 Encounter for immunization: Secondary | ICD-10-CM | POA: Diagnosis not present

## 2018-02-24 DIAGNOSIS — C569 Malignant neoplasm of unspecified ovary: Secondary | ICD-10-CM | POA: Diagnosis not present

## 2018-02-24 DIAGNOSIS — D509 Iron deficiency anemia, unspecified: Secondary | ICD-10-CM | POA: Diagnosis not present

## 2018-02-24 DIAGNOSIS — Z5111 Encounter for antineoplastic chemotherapy: Secondary | ICD-10-CM | POA: Diagnosis not present

## 2018-02-24 DIAGNOSIS — D649 Anemia, unspecified: Secondary | ICD-10-CM | POA: Diagnosis not present

## 2018-02-24 DIAGNOSIS — D611 Drug-induced aplastic anemia: Secondary | ICD-10-CM | POA: Diagnosis not present

## 2018-03-02 DIAGNOSIS — C569 Malignant neoplasm of unspecified ovary: Secondary | ICD-10-CM | POA: Diagnosis not present

## 2018-03-02 DIAGNOSIS — D509 Iron deficiency anemia, unspecified: Secondary | ICD-10-CM | POA: Diagnosis not present

## 2018-03-02 DIAGNOSIS — Z5111 Encounter for antineoplastic chemotherapy: Secondary | ICD-10-CM | POA: Diagnosis not present

## 2018-03-04 DIAGNOSIS — Z5111 Encounter for antineoplastic chemotherapy: Secondary | ICD-10-CM | POA: Diagnosis not present

## 2018-03-04 DIAGNOSIS — D509 Iron deficiency anemia, unspecified: Secondary | ICD-10-CM | POA: Diagnosis not present

## 2018-03-04 DIAGNOSIS — C569 Malignant neoplasm of unspecified ovary: Secondary | ICD-10-CM | POA: Diagnosis not present

## 2018-03-17 DIAGNOSIS — C569 Malignant neoplasm of unspecified ovary: Secondary | ICD-10-CM | POA: Diagnosis not present

## 2018-03-23 DIAGNOSIS — C569 Malignant neoplasm of unspecified ovary: Secondary | ICD-10-CM | POA: Diagnosis not present

## 2018-03-23 DIAGNOSIS — Z5111 Encounter for antineoplastic chemotherapy: Secondary | ICD-10-CM | POA: Diagnosis not present

## 2018-03-24 DIAGNOSIS — C569 Malignant neoplasm of unspecified ovary: Secondary | ICD-10-CM | POA: Diagnosis not present

## 2018-03-24 DIAGNOSIS — Z5111 Encounter for antineoplastic chemotherapy: Secondary | ICD-10-CM | POA: Diagnosis not present

## 2018-04-13 DIAGNOSIS — C569 Malignant neoplasm of unspecified ovary: Secondary | ICD-10-CM | POA: Diagnosis not present

## 2018-04-14 DIAGNOSIS — Z5111 Encounter for antineoplastic chemotherapy: Secondary | ICD-10-CM | POA: Diagnosis not present

## 2018-04-14 DIAGNOSIS — D509 Iron deficiency anemia, unspecified: Secondary | ICD-10-CM | POA: Diagnosis not present

## 2018-04-14 DIAGNOSIS — C569 Malignant neoplasm of unspecified ovary: Secondary | ICD-10-CM | POA: Diagnosis not present

## 2018-04-14 DIAGNOSIS — I10 Essential (primary) hypertension: Secondary | ICD-10-CM | POA: Diagnosis not present

## 2018-05-03 DIAGNOSIS — D509 Iron deficiency anemia, unspecified: Secondary | ICD-10-CM | POA: Diagnosis not present

## 2018-05-03 DIAGNOSIS — C569 Malignant neoplasm of unspecified ovary: Secondary | ICD-10-CM | POA: Diagnosis not present

## 2018-05-03 DIAGNOSIS — I10 Essential (primary) hypertension: Secondary | ICD-10-CM | POA: Diagnosis not present

## 2018-05-03 DIAGNOSIS — Z5111 Encounter for antineoplastic chemotherapy: Secondary | ICD-10-CM | POA: Diagnosis not present

## 2018-05-05 DIAGNOSIS — D509 Iron deficiency anemia, unspecified: Secondary | ICD-10-CM | POA: Diagnosis not present

## 2018-05-05 DIAGNOSIS — C569 Malignant neoplasm of unspecified ovary: Secondary | ICD-10-CM | POA: Diagnosis not present

## 2018-05-05 DIAGNOSIS — I10 Essential (primary) hypertension: Secondary | ICD-10-CM | POA: Diagnosis not present

## 2018-05-05 DIAGNOSIS — Z5111 Encounter for antineoplastic chemotherapy: Secondary | ICD-10-CM | POA: Diagnosis not present

## 2018-05-25 DIAGNOSIS — F329 Major depressive disorder, single episode, unspecified: Secondary | ICD-10-CM | POA: Diagnosis not present

## 2018-05-25 DIAGNOSIS — E785 Hyperlipidemia, unspecified: Secondary | ICD-10-CM | POA: Diagnosis not present

## 2018-05-25 DIAGNOSIS — Z90722 Acquired absence of ovaries, bilateral: Secondary | ICD-10-CM | POA: Diagnosis not present

## 2018-05-25 DIAGNOSIS — Z90710 Acquired absence of both cervix and uterus: Secondary | ICD-10-CM | POA: Diagnosis not present

## 2018-05-25 DIAGNOSIS — C569 Malignant neoplasm of unspecified ovary: Secondary | ICD-10-CM | POA: Diagnosis not present

## 2018-05-25 DIAGNOSIS — Z8719 Personal history of other diseases of the digestive system: Secondary | ICD-10-CM | POA: Diagnosis not present

## 2018-05-25 DIAGNOSIS — Z5111 Encounter for antineoplastic chemotherapy: Secondary | ICD-10-CM | POA: Diagnosis not present

## 2018-05-25 DIAGNOSIS — I10 Essential (primary) hypertension: Secondary | ICD-10-CM | POA: Diagnosis not present

## 2018-05-25 DIAGNOSIS — Z85828 Personal history of other malignant neoplasm of skin: Secondary | ICD-10-CM | POA: Diagnosis not present

## 2018-05-26 DIAGNOSIS — C569 Malignant neoplasm of unspecified ovary: Secondary | ICD-10-CM | POA: Diagnosis not present

## 2018-05-26 DIAGNOSIS — Z90710 Acquired absence of both cervix and uterus: Secondary | ICD-10-CM | POA: Diagnosis not present

## 2018-05-26 DIAGNOSIS — Z8719 Personal history of other diseases of the digestive system: Secondary | ICD-10-CM | POA: Diagnosis not present

## 2018-05-26 DIAGNOSIS — I10 Essential (primary) hypertension: Secondary | ICD-10-CM | POA: Diagnosis not present

## 2018-05-26 DIAGNOSIS — Z5111 Encounter for antineoplastic chemotherapy: Secondary | ICD-10-CM | POA: Diagnosis not present

## 2018-05-26 DIAGNOSIS — F329 Major depressive disorder, single episode, unspecified: Secondary | ICD-10-CM | POA: Diagnosis not present

## 2018-06-15 DIAGNOSIS — Z79899 Other long term (current) drug therapy: Secondary | ICD-10-CM | POA: Diagnosis not present

## 2018-06-15 DIAGNOSIS — Z5111 Encounter for antineoplastic chemotherapy: Secondary | ICD-10-CM | POA: Diagnosis not present

## 2018-06-15 DIAGNOSIS — I10 Essential (primary) hypertension: Secondary | ICD-10-CM | POA: Diagnosis not present

## 2018-06-15 DIAGNOSIS — Z87891 Personal history of nicotine dependence: Secondary | ICD-10-CM | POA: Diagnosis not present

## 2018-06-15 DIAGNOSIS — K219 Gastro-esophageal reflux disease without esophagitis: Secondary | ICD-10-CM | POA: Diagnosis not present

## 2018-06-15 DIAGNOSIS — C569 Malignant neoplasm of unspecified ovary: Secondary | ICD-10-CM | POA: Diagnosis not present

## 2018-06-16 DIAGNOSIS — I10 Essential (primary) hypertension: Secondary | ICD-10-CM | POA: Diagnosis not present

## 2018-06-16 DIAGNOSIS — Z87891 Personal history of nicotine dependence: Secondary | ICD-10-CM | POA: Diagnosis not present

## 2018-06-16 DIAGNOSIS — K219 Gastro-esophageal reflux disease without esophagitis: Secondary | ICD-10-CM | POA: Diagnosis not present

## 2018-06-16 DIAGNOSIS — Z5111 Encounter for antineoplastic chemotherapy: Secondary | ICD-10-CM | POA: Diagnosis not present

## 2018-06-16 DIAGNOSIS — C569 Malignant neoplasm of unspecified ovary: Secondary | ICD-10-CM | POA: Diagnosis not present

## 2018-06-16 DIAGNOSIS — Z79899 Other long term (current) drug therapy: Secondary | ICD-10-CM | POA: Diagnosis not present

## 2018-06-30 DIAGNOSIS — Z5111 Encounter for antineoplastic chemotherapy: Secondary | ICD-10-CM | POA: Diagnosis not present

## 2018-06-30 DIAGNOSIS — Z79899 Other long term (current) drug therapy: Secondary | ICD-10-CM | POA: Diagnosis not present

## 2018-06-30 DIAGNOSIS — Z87891 Personal history of nicotine dependence: Secondary | ICD-10-CM | POA: Diagnosis not present

## 2018-06-30 DIAGNOSIS — I10 Essential (primary) hypertension: Secondary | ICD-10-CM | POA: Diagnosis not present

## 2018-06-30 DIAGNOSIS — K219 Gastro-esophageal reflux disease without esophagitis: Secondary | ICD-10-CM | POA: Diagnosis not present

## 2018-06-30 DIAGNOSIS — C569 Malignant neoplasm of unspecified ovary: Secondary | ICD-10-CM | POA: Diagnosis not present

## 2018-07-06 DIAGNOSIS — Z79899 Other long term (current) drug therapy: Secondary | ICD-10-CM | POA: Diagnosis not present

## 2018-07-06 DIAGNOSIS — C569 Malignant neoplasm of unspecified ovary: Secondary | ICD-10-CM | POA: Diagnosis not present

## 2018-07-06 DIAGNOSIS — Z5111 Encounter for antineoplastic chemotherapy: Secondary | ICD-10-CM | POA: Diagnosis not present

## 2018-07-06 DIAGNOSIS — Z87891 Personal history of nicotine dependence: Secondary | ICD-10-CM | POA: Diagnosis not present

## 2018-07-06 DIAGNOSIS — K219 Gastro-esophageal reflux disease without esophagitis: Secondary | ICD-10-CM | POA: Diagnosis not present

## 2018-07-06 DIAGNOSIS — I10 Essential (primary) hypertension: Secondary | ICD-10-CM | POA: Diagnosis not present

## 2018-07-07 DIAGNOSIS — C569 Malignant neoplasm of unspecified ovary: Secondary | ICD-10-CM | POA: Diagnosis not present

## 2018-07-07 DIAGNOSIS — K219 Gastro-esophageal reflux disease without esophagitis: Secondary | ICD-10-CM | POA: Diagnosis not present

## 2018-07-07 DIAGNOSIS — Z5111 Encounter for antineoplastic chemotherapy: Secondary | ICD-10-CM | POA: Diagnosis not present

## 2018-07-07 DIAGNOSIS — Z79899 Other long term (current) drug therapy: Secondary | ICD-10-CM | POA: Diagnosis not present

## 2018-07-07 DIAGNOSIS — Z87891 Personal history of nicotine dependence: Secondary | ICD-10-CM | POA: Diagnosis not present

## 2018-07-07 DIAGNOSIS — I10 Essential (primary) hypertension: Secondary | ICD-10-CM | POA: Diagnosis not present

## 2018-07-27 DIAGNOSIS — C569 Malignant neoplasm of unspecified ovary: Secondary | ICD-10-CM | POA: Diagnosis not present

## 2018-07-27 DIAGNOSIS — D649 Anemia, unspecified: Secondary | ICD-10-CM | POA: Diagnosis not present

## 2018-07-27 DIAGNOSIS — Z5111 Encounter for antineoplastic chemotherapy: Secondary | ICD-10-CM | POA: Diagnosis not present

## 2018-07-28 DIAGNOSIS — D649 Anemia, unspecified: Secondary | ICD-10-CM | POA: Diagnosis not present

## 2018-07-28 DIAGNOSIS — Z5111 Encounter for antineoplastic chemotherapy: Secondary | ICD-10-CM | POA: Diagnosis not present

## 2018-07-28 DIAGNOSIS — C569 Malignant neoplasm of unspecified ovary: Secondary | ICD-10-CM | POA: Diagnosis not present

## 2018-08-05 DIAGNOSIS — S060X9A Concussion with loss of consciousness of unspecified duration, initial encounter: Secondary | ICD-10-CM | POA: Diagnosis not present

## 2018-08-05 DIAGNOSIS — Z043 Encounter for examination and observation following other accident: Secondary | ICD-10-CM | POA: Diagnosis not present

## 2018-08-05 DIAGNOSIS — I10 Essential (primary) hypertension: Secondary | ICD-10-CM | POA: Diagnosis not present

## 2018-08-05 DIAGNOSIS — Z79899 Other long term (current) drug therapy: Secondary | ICD-10-CM | POA: Diagnosis not present

## 2018-08-05 DIAGNOSIS — S060X0A Concussion without loss of consciousness, initial encounter: Secondary | ICD-10-CM | POA: Diagnosis not present

## 2018-08-05 DIAGNOSIS — Z880 Allergy status to penicillin: Secondary | ICD-10-CM | POA: Diagnosis not present

## 2018-08-05 DIAGNOSIS — M419 Scoliosis, unspecified: Secondary | ICD-10-CM | POA: Diagnosis not present

## 2018-08-05 DIAGNOSIS — I341 Nonrheumatic mitral (valve) prolapse: Secondary | ICD-10-CM | POA: Diagnosis not present

## 2018-08-05 DIAGNOSIS — Z9221 Personal history of antineoplastic chemotherapy: Secondary | ICD-10-CM | POA: Diagnosis not present

## 2018-08-05 DIAGNOSIS — Z8543 Personal history of malignant neoplasm of ovary: Secondary | ICD-10-CM | POA: Diagnosis not present

## 2018-08-05 DIAGNOSIS — Z7982 Long term (current) use of aspirin: Secondary | ICD-10-CM | POA: Diagnosis not present

## 2018-08-05 DIAGNOSIS — M546 Pain in thoracic spine: Secondary | ICD-10-CM | POA: Diagnosis not present

## 2018-08-05 DIAGNOSIS — Z881 Allergy status to other antibiotic agents status: Secondary | ICD-10-CM | POA: Diagnosis not present

## 2018-08-05 DIAGNOSIS — Z882 Allergy status to sulfonamides status: Secondary | ICD-10-CM | POA: Diagnosis not present

## 2018-08-05 DIAGNOSIS — Z853 Personal history of malignant neoplasm of breast: Secondary | ICD-10-CM | POA: Diagnosis not present

## 2018-08-05 DIAGNOSIS — G44319 Acute post-traumatic headache, not intractable: Secondary | ICD-10-CM | POA: Diagnosis not present

## 2018-08-05 DIAGNOSIS — Z886 Allergy status to analgesic agent status: Secondary | ICD-10-CM | POA: Diagnosis not present

## 2018-08-05 DIAGNOSIS — I6782 Cerebral ischemia: Secondary | ICD-10-CM | POA: Diagnosis not present

## 2018-08-05 DIAGNOSIS — J449 Chronic obstructive pulmonary disease, unspecified: Secondary | ICD-10-CM | POA: Diagnosis not present

## 2018-08-05 DIAGNOSIS — E785 Hyperlipidemia, unspecified: Secondary | ICD-10-CM | POA: Diagnosis not present

## 2018-08-05 DIAGNOSIS — Z888 Allergy status to other drugs, medicaments and biological substances status: Secondary | ICD-10-CM | POA: Diagnosis not present

## 2018-08-05 DIAGNOSIS — Z87891 Personal history of nicotine dependence: Secondary | ICD-10-CM | POA: Diagnosis not present

## 2018-08-05 DIAGNOSIS — K219 Gastro-esophageal reflux disease without esophagitis: Secondary | ICD-10-CM | POA: Diagnosis not present

## 2018-08-17 DIAGNOSIS — Z880 Allergy status to penicillin: Secondary | ICD-10-CM | POA: Diagnosis not present

## 2018-08-17 DIAGNOSIS — Z7982 Long term (current) use of aspirin: Secondary | ICD-10-CM | POA: Diagnosis not present

## 2018-08-17 DIAGNOSIS — Z882 Allergy status to sulfonamides status: Secondary | ICD-10-CM | POA: Diagnosis not present

## 2018-08-17 DIAGNOSIS — Z5112 Encounter for antineoplastic immunotherapy: Secondary | ICD-10-CM | POA: Diagnosis not present

## 2018-08-17 DIAGNOSIS — J449 Chronic obstructive pulmonary disease, unspecified: Secondary | ICD-10-CM | POA: Diagnosis not present

## 2018-08-17 DIAGNOSIS — Z9079 Acquired absence of other genital organ(s): Secondary | ICD-10-CM | POA: Diagnosis not present

## 2018-08-17 DIAGNOSIS — Z90710 Acquired absence of both cervix and uterus: Secondary | ICD-10-CM | POA: Diagnosis not present

## 2018-08-17 DIAGNOSIS — I10 Essential (primary) hypertension: Secondary | ICD-10-CM | POA: Diagnosis not present

## 2018-08-17 DIAGNOSIS — C569 Malignant neoplasm of unspecified ovary: Secondary | ICD-10-CM | POA: Diagnosis not present

## 2018-08-17 DIAGNOSIS — Z90722 Acquired absence of ovaries, bilateral: Secondary | ICD-10-CM | POA: Diagnosis not present

## 2018-08-17 DIAGNOSIS — Z881 Allergy status to other antibiotic agents status: Secondary | ICD-10-CM | POA: Diagnosis not present

## 2018-08-17 DIAGNOSIS — Z888 Allergy status to other drugs, medicaments and biological substances status: Secondary | ICD-10-CM | POA: Diagnosis not present

## 2018-08-17 DIAGNOSIS — Z87891 Personal history of nicotine dependence: Secondary | ICD-10-CM | POA: Diagnosis not present

## 2018-08-18 DIAGNOSIS — J449 Chronic obstructive pulmonary disease, unspecified: Secondary | ICD-10-CM | POA: Diagnosis not present

## 2018-08-18 DIAGNOSIS — Z880 Allergy status to penicillin: Secondary | ICD-10-CM | POA: Diagnosis not present

## 2018-08-18 DIAGNOSIS — C569 Malignant neoplasm of unspecified ovary: Secondary | ICD-10-CM | POA: Diagnosis not present

## 2018-08-18 DIAGNOSIS — I10 Essential (primary) hypertension: Secondary | ICD-10-CM | POA: Diagnosis not present

## 2018-08-18 DIAGNOSIS — Z87891 Personal history of nicotine dependence: Secondary | ICD-10-CM | POA: Diagnosis not present

## 2018-08-18 DIAGNOSIS — Z5112 Encounter for antineoplastic immunotherapy: Secondary | ICD-10-CM | POA: Diagnosis not present

## 2018-09-06 DIAGNOSIS — Z87891 Personal history of nicotine dependence: Secondary | ICD-10-CM | POA: Diagnosis not present

## 2018-09-06 DIAGNOSIS — Z880 Allergy status to penicillin: Secondary | ICD-10-CM | POA: Diagnosis not present

## 2018-09-06 DIAGNOSIS — J449 Chronic obstructive pulmonary disease, unspecified: Secondary | ICD-10-CM | POA: Diagnosis not present

## 2018-09-06 DIAGNOSIS — Z5112 Encounter for antineoplastic immunotherapy: Secondary | ICD-10-CM | POA: Diagnosis not present

## 2018-09-06 DIAGNOSIS — I10 Essential (primary) hypertension: Secondary | ICD-10-CM | POA: Diagnosis not present

## 2018-09-06 DIAGNOSIS — C569 Malignant neoplasm of unspecified ovary: Secondary | ICD-10-CM | POA: Diagnosis not present

## 2018-09-08 DIAGNOSIS — Z87891 Personal history of nicotine dependence: Secondary | ICD-10-CM | POA: Diagnosis not present

## 2018-09-08 DIAGNOSIS — Z5112 Encounter for antineoplastic immunotherapy: Secondary | ICD-10-CM | POA: Diagnosis not present

## 2018-09-08 DIAGNOSIS — I10 Essential (primary) hypertension: Secondary | ICD-10-CM | POA: Diagnosis not present

## 2018-09-08 DIAGNOSIS — C569 Malignant neoplasm of unspecified ovary: Secondary | ICD-10-CM | POA: Diagnosis not present

## 2018-09-08 DIAGNOSIS — J449 Chronic obstructive pulmonary disease, unspecified: Secondary | ICD-10-CM | POA: Diagnosis not present

## 2018-09-08 DIAGNOSIS — Z880 Allergy status to penicillin: Secondary | ICD-10-CM | POA: Diagnosis not present

## 2018-09-23 DIAGNOSIS — D1801 Hemangioma of skin and subcutaneous tissue: Secondary | ICD-10-CM | POA: Diagnosis not present

## 2018-09-23 DIAGNOSIS — L814 Other melanin hyperpigmentation: Secondary | ICD-10-CM | POA: Diagnosis not present

## 2018-09-23 DIAGNOSIS — C44729 Squamous cell carcinoma of skin of left lower limb, including hip: Secondary | ICD-10-CM | POA: Diagnosis not present

## 2018-09-23 DIAGNOSIS — L570 Actinic keratosis: Secondary | ICD-10-CM | POA: Diagnosis not present

## 2018-09-23 DIAGNOSIS — D485 Neoplasm of uncertain behavior of skin: Secondary | ICD-10-CM | POA: Diagnosis not present

## 2018-09-27 DIAGNOSIS — C569 Malignant neoplasm of unspecified ovary: Secondary | ICD-10-CM | POA: Diagnosis not present

## 2018-09-27 DIAGNOSIS — Z5112 Encounter for antineoplastic immunotherapy: Secondary | ICD-10-CM | POA: Diagnosis not present

## 2018-09-27 DIAGNOSIS — I10 Essential (primary) hypertension: Secondary | ICD-10-CM | POA: Diagnosis not present

## 2018-09-27 DIAGNOSIS — Z87891 Personal history of nicotine dependence: Secondary | ICD-10-CM | POA: Diagnosis not present

## 2018-09-27 DIAGNOSIS — Z9079 Acquired absence of other genital organ(s): Secondary | ICD-10-CM | POA: Diagnosis not present

## 2018-09-27 DIAGNOSIS — Z7982 Long term (current) use of aspirin: Secondary | ICD-10-CM | POA: Diagnosis not present

## 2018-09-27 DIAGNOSIS — J449 Chronic obstructive pulmonary disease, unspecified: Secondary | ICD-10-CM | POA: Diagnosis not present

## 2018-09-27 DIAGNOSIS — C7652 Malignant neoplasm of left lower limb: Secondary | ICD-10-CM | POA: Diagnosis not present

## 2018-09-27 DIAGNOSIS — Z90722 Acquired absence of ovaries, bilateral: Secondary | ICD-10-CM | POA: Diagnosis not present

## 2018-09-27 DIAGNOSIS — Z90710 Acquired absence of both cervix and uterus: Secondary | ICD-10-CM | POA: Diagnosis not present

## 2018-09-28 DIAGNOSIS — I10 Essential (primary) hypertension: Secondary | ICD-10-CM | POA: Diagnosis not present

## 2018-09-28 DIAGNOSIS — C7652 Malignant neoplasm of left lower limb: Secondary | ICD-10-CM | POA: Diagnosis not present

## 2018-09-28 DIAGNOSIS — Z87891 Personal history of nicotine dependence: Secondary | ICD-10-CM | POA: Diagnosis not present

## 2018-09-28 DIAGNOSIS — Z5112 Encounter for antineoplastic immunotherapy: Secondary | ICD-10-CM | POA: Diagnosis not present

## 2018-09-28 DIAGNOSIS — C569 Malignant neoplasm of unspecified ovary: Secondary | ICD-10-CM | POA: Diagnosis not present

## 2018-09-28 DIAGNOSIS — J449 Chronic obstructive pulmonary disease, unspecified: Secondary | ICD-10-CM | POA: Diagnosis not present

## 2018-09-29 DIAGNOSIS — I10 Essential (primary) hypertension: Secondary | ICD-10-CM | POA: Diagnosis not present

## 2018-09-29 DIAGNOSIS — C569 Malignant neoplasm of unspecified ovary: Secondary | ICD-10-CM | POA: Diagnosis not present

## 2018-09-29 DIAGNOSIS — Z87891 Personal history of nicotine dependence: Secondary | ICD-10-CM | POA: Diagnosis not present

## 2018-09-29 DIAGNOSIS — C7652 Malignant neoplasm of left lower limb: Secondary | ICD-10-CM | POA: Diagnosis not present

## 2018-09-29 DIAGNOSIS — J449 Chronic obstructive pulmonary disease, unspecified: Secondary | ICD-10-CM | POA: Diagnosis not present

## 2018-09-29 DIAGNOSIS — Z5112 Encounter for antineoplastic immunotherapy: Secondary | ICD-10-CM | POA: Diagnosis not present

## 2018-10-06 DIAGNOSIS — C569 Malignant neoplasm of unspecified ovary: Secondary | ICD-10-CM | POA: Diagnosis not present

## 2018-10-06 DIAGNOSIS — Z5112 Encounter for antineoplastic immunotherapy: Secondary | ICD-10-CM | POA: Diagnosis not present

## 2018-10-06 DIAGNOSIS — I10 Essential (primary) hypertension: Secondary | ICD-10-CM | POA: Diagnosis not present

## 2018-10-06 DIAGNOSIS — Z87891 Personal history of nicotine dependence: Secondary | ICD-10-CM | POA: Diagnosis not present

## 2018-10-06 DIAGNOSIS — C7652 Malignant neoplasm of left lower limb: Secondary | ICD-10-CM | POA: Diagnosis not present

## 2018-10-06 DIAGNOSIS — J449 Chronic obstructive pulmonary disease, unspecified: Secondary | ICD-10-CM | POA: Diagnosis not present

## 2018-10-06 DIAGNOSIS — M545 Low back pain: Secondary | ICD-10-CM | POA: Diagnosis not present

## 2018-10-06 DIAGNOSIS — G8929 Other chronic pain: Secondary | ICD-10-CM | POA: Diagnosis not present

## 2018-10-17 DIAGNOSIS — Z79899 Other long term (current) drug therapy: Secondary | ICD-10-CM | POA: Diagnosis not present

## 2018-10-17 DIAGNOSIS — M545 Low back pain: Secondary | ICD-10-CM | POA: Diagnosis not present

## 2018-10-17 DIAGNOSIS — G894 Chronic pain syndrome: Secondary | ICD-10-CM | POA: Diagnosis not present

## 2018-10-17 DIAGNOSIS — Z5181 Encounter for therapeutic drug level monitoring: Secondary | ICD-10-CM | POA: Diagnosis not present

## 2018-10-17 DIAGNOSIS — M533 Sacrococcygeal disorders, not elsewhere classified: Secondary | ICD-10-CM | POA: Diagnosis not present

## 2018-10-17 DIAGNOSIS — M47816 Spondylosis without myelopathy or radiculopathy, lumbar region: Secondary | ICD-10-CM | POA: Diagnosis not present

## 2018-10-20 DIAGNOSIS — M545 Low back pain: Secondary | ICD-10-CM | POA: Diagnosis not present

## 2018-10-20 DIAGNOSIS — G8929 Other chronic pain: Secondary | ICD-10-CM | POA: Diagnosis not present

## 2018-10-26 DIAGNOSIS — C44719 Basal cell carcinoma of skin of left lower limb, including hip: Secondary | ICD-10-CM | POA: Diagnosis not present

## 2018-10-26 DIAGNOSIS — D485 Neoplasm of uncertain behavior of skin: Secondary | ICD-10-CM | POA: Diagnosis not present

## 2018-10-26 DIAGNOSIS — C44729 Squamous cell carcinoma of skin of left lower limb, including hip: Secondary | ICD-10-CM | POA: Diagnosis not present

## 2018-11-01 DIAGNOSIS — I10 Essential (primary) hypertension: Secondary | ICD-10-CM | POA: Diagnosis not present

## 2018-11-01 DIAGNOSIS — E782 Mixed hyperlipidemia: Secondary | ICD-10-CM | POA: Diagnosis not present

## 2018-11-01 DIAGNOSIS — F324 Major depressive disorder, single episode, in partial remission: Secondary | ICD-10-CM | POA: Diagnosis not present

## 2018-11-01 DIAGNOSIS — K219 Gastro-esophageal reflux disease without esophagitis: Secondary | ICD-10-CM | POA: Diagnosis not present

## 2018-11-01 DIAGNOSIS — N182 Chronic kidney disease, stage 2 (mild): Secondary | ICD-10-CM | POA: Diagnosis not present

## 2018-11-01 DIAGNOSIS — F411 Generalized anxiety disorder: Secondary | ICD-10-CM | POA: Diagnosis not present

## 2018-11-01 DIAGNOSIS — C569 Malignant neoplasm of unspecified ovary: Secondary | ICD-10-CM | POA: Diagnosis not present

## 2018-11-23 DIAGNOSIS — I341 Nonrheumatic mitral (valve) prolapse: Secondary | ICD-10-CM | POA: Diagnosis not present

## 2018-11-23 DIAGNOSIS — Z9221 Personal history of antineoplastic chemotherapy: Secondary | ICD-10-CM | POA: Diagnosis not present

## 2018-11-23 DIAGNOSIS — I890 Lymphedema, not elsewhere classified: Secondary | ICD-10-CM | POA: Diagnosis not present

## 2018-11-23 DIAGNOSIS — Z9079 Acquired absence of other genital organ(s): Secondary | ICD-10-CM | POA: Diagnosis not present

## 2018-11-23 DIAGNOSIS — C569 Malignant neoplasm of unspecified ovary: Secondary | ICD-10-CM | POA: Diagnosis not present

## 2018-11-23 DIAGNOSIS — Z90722 Acquired absence of ovaries, bilateral: Secondary | ICD-10-CM | POA: Diagnosis not present

## 2018-11-23 DIAGNOSIS — E785 Hyperlipidemia, unspecified: Secondary | ICD-10-CM | POA: Diagnosis not present

## 2018-11-23 DIAGNOSIS — I10 Essential (primary) hypertension: Secondary | ICD-10-CM | POA: Diagnosis not present

## 2018-11-23 DIAGNOSIS — F329 Major depressive disorder, single episode, unspecified: Secondary | ICD-10-CM | POA: Diagnosis not present

## 2018-11-23 DIAGNOSIS — Z90710 Acquired absence of both cervix and uterus: Secondary | ICD-10-CM | POA: Diagnosis not present

## 2018-11-23 DIAGNOSIS — C799 Secondary malignant neoplasm of unspecified site: Secondary | ICD-10-CM | POA: Diagnosis not present

## 2018-11-24 DIAGNOSIS — I890 Lymphedema, not elsewhere classified: Secondary | ICD-10-CM | POA: Diagnosis not present

## 2018-11-24 DIAGNOSIS — F329 Major depressive disorder, single episode, unspecified: Secondary | ICD-10-CM | POA: Diagnosis not present

## 2018-11-24 DIAGNOSIS — C569 Malignant neoplasm of unspecified ovary: Secondary | ICD-10-CM | POA: Diagnosis not present

## 2018-11-24 DIAGNOSIS — I10 Essential (primary) hypertension: Secondary | ICD-10-CM | POA: Diagnosis not present

## 2018-11-24 DIAGNOSIS — E785 Hyperlipidemia, unspecified: Secondary | ICD-10-CM | POA: Diagnosis not present

## 2018-11-24 DIAGNOSIS — C799 Secondary malignant neoplasm of unspecified site: Secondary | ICD-10-CM | POA: Diagnosis not present

## 2018-12-07 DIAGNOSIS — F329 Major depressive disorder, single episode, unspecified: Secondary | ICD-10-CM | POA: Diagnosis not present

## 2018-12-07 DIAGNOSIS — I10 Essential (primary) hypertension: Secondary | ICD-10-CM | POA: Diagnosis not present

## 2018-12-07 DIAGNOSIS — C569 Malignant neoplasm of unspecified ovary: Secondary | ICD-10-CM | POA: Diagnosis not present

## 2018-12-07 DIAGNOSIS — E785 Hyperlipidemia, unspecified: Secondary | ICD-10-CM | POA: Diagnosis not present

## 2018-12-07 DIAGNOSIS — C799 Secondary malignant neoplasm of unspecified site: Secondary | ICD-10-CM | POA: Diagnosis not present

## 2018-12-07 DIAGNOSIS — I890 Lymphedema, not elsewhere classified: Secondary | ICD-10-CM | POA: Diagnosis not present

## 2018-12-14 DIAGNOSIS — Z9079 Acquired absence of other genital organ(s): Secondary | ICD-10-CM | POA: Diagnosis not present

## 2018-12-14 DIAGNOSIS — Z90710 Acquired absence of both cervix and uterus: Secondary | ICD-10-CM | POA: Diagnosis not present

## 2018-12-14 DIAGNOSIS — Z5112 Encounter for antineoplastic immunotherapy: Secondary | ICD-10-CM | POA: Diagnosis not present

## 2018-12-14 DIAGNOSIS — I10 Essential (primary) hypertension: Secondary | ICD-10-CM | POA: Diagnosis not present

## 2018-12-14 DIAGNOSIS — Z90722 Acquired absence of ovaries, bilateral: Secondary | ICD-10-CM | POA: Diagnosis not present

## 2018-12-14 DIAGNOSIS — C569 Malignant neoplasm of unspecified ovary: Secondary | ICD-10-CM | POA: Diagnosis not present

## 2018-12-14 DIAGNOSIS — Z87891 Personal history of nicotine dependence: Secondary | ICD-10-CM | POA: Diagnosis not present

## 2018-12-15 DIAGNOSIS — Z9079 Acquired absence of other genital organ(s): Secondary | ICD-10-CM | POA: Diagnosis not present

## 2018-12-15 DIAGNOSIS — Z90710 Acquired absence of both cervix and uterus: Secondary | ICD-10-CM | POA: Diagnosis not present

## 2018-12-15 DIAGNOSIS — I10 Essential (primary) hypertension: Secondary | ICD-10-CM | POA: Diagnosis not present

## 2018-12-15 DIAGNOSIS — C569 Malignant neoplasm of unspecified ovary: Secondary | ICD-10-CM | POA: Diagnosis not present

## 2018-12-15 DIAGNOSIS — Z87891 Personal history of nicotine dependence: Secondary | ICD-10-CM | POA: Diagnosis not present

## 2018-12-15 DIAGNOSIS — Z5112 Encounter for antineoplastic immunotherapy: Secondary | ICD-10-CM | POA: Diagnosis not present

## 2018-12-29 DIAGNOSIS — Z87891 Personal history of nicotine dependence: Secondary | ICD-10-CM | POA: Diagnosis not present

## 2018-12-29 DIAGNOSIS — Z9079 Acquired absence of other genital organ(s): Secondary | ICD-10-CM | POA: Diagnosis not present

## 2018-12-29 DIAGNOSIS — Z90710 Acquired absence of both cervix and uterus: Secondary | ICD-10-CM | POA: Diagnosis not present

## 2018-12-29 DIAGNOSIS — C569 Malignant neoplasm of unspecified ovary: Secondary | ICD-10-CM | POA: Diagnosis not present

## 2018-12-29 DIAGNOSIS — I10 Essential (primary) hypertension: Secondary | ICD-10-CM | POA: Diagnosis not present

## 2018-12-29 DIAGNOSIS — Z5112 Encounter for antineoplastic immunotherapy: Secondary | ICD-10-CM | POA: Diagnosis not present

## 2019-01-04 DIAGNOSIS — Z87891 Personal history of nicotine dependence: Secondary | ICD-10-CM | POA: Diagnosis not present

## 2019-01-04 DIAGNOSIS — C569 Malignant neoplasm of unspecified ovary: Secondary | ICD-10-CM | POA: Diagnosis not present

## 2019-01-04 DIAGNOSIS — I10 Essential (primary) hypertension: Secondary | ICD-10-CM | POA: Diagnosis not present

## 2019-01-04 DIAGNOSIS — Z90710 Acquired absence of both cervix and uterus: Secondary | ICD-10-CM | POA: Diagnosis not present

## 2019-01-04 DIAGNOSIS — Z9079 Acquired absence of other genital organ(s): Secondary | ICD-10-CM | POA: Diagnosis not present

## 2019-01-04 DIAGNOSIS — Z5112 Encounter for antineoplastic immunotherapy: Secondary | ICD-10-CM | POA: Diagnosis not present

## 2019-01-05 DIAGNOSIS — Z90710 Acquired absence of both cervix and uterus: Secondary | ICD-10-CM | POA: Diagnosis not present

## 2019-01-05 DIAGNOSIS — Z5112 Encounter for antineoplastic immunotherapy: Secondary | ICD-10-CM | POA: Diagnosis not present

## 2019-01-05 DIAGNOSIS — Z9079 Acquired absence of other genital organ(s): Secondary | ICD-10-CM | POA: Diagnosis not present

## 2019-01-05 DIAGNOSIS — Z87891 Personal history of nicotine dependence: Secondary | ICD-10-CM | POA: Diagnosis not present

## 2019-01-05 DIAGNOSIS — C569 Malignant neoplasm of unspecified ovary: Secondary | ICD-10-CM | POA: Diagnosis not present

## 2019-01-05 DIAGNOSIS — I10 Essential (primary) hypertension: Secondary | ICD-10-CM | POA: Diagnosis not present

## 2019-01-25 ENCOUNTER — Other Ambulatory Visit: Payer: Self-pay | Admitting: Obstetrics and Gynecology

## 2019-01-25 DIAGNOSIS — Z1231 Encounter for screening mammogram for malignant neoplasm of breast: Secondary | ICD-10-CM

## 2019-01-26 DIAGNOSIS — C569 Malignant neoplasm of unspecified ovary: Secondary | ICD-10-CM | POA: Diagnosis not present

## 2019-01-26 DIAGNOSIS — Z79899 Other long term (current) drug therapy: Secondary | ICD-10-CM | POA: Diagnosis not present

## 2019-01-26 DIAGNOSIS — Z5111 Encounter for antineoplastic chemotherapy: Secondary | ICD-10-CM | POA: Diagnosis not present

## 2019-01-27 DIAGNOSIS — C569 Malignant neoplasm of unspecified ovary: Secondary | ICD-10-CM | POA: Diagnosis not present

## 2019-01-27 DIAGNOSIS — Z79899 Other long term (current) drug therapy: Secondary | ICD-10-CM | POA: Diagnosis not present

## 2019-01-27 DIAGNOSIS — Z5111 Encounter for antineoplastic chemotherapy: Secondary | ICD-10-CM | POA: Diagnosis not present

## 2019-02-07 DIAGNOSIS — Z23 Encounter for immunization: Secondary | ICD-10-CM | POA: Diagnosis not present

## 2019-02-13 DIAGNOSIS — Z85828 Personal history of other malignant neoplasm of skin: Secondary | ICD-10-CM | POA: Diagnosis not present

## 2019-02-13 DIAGNOSIS — E785 Hyperlipidemia, unspecified: Secondary | ICD-10-CM | POA: Diagnosis not present

## 2019-02-13 DIAGNOSIS — I10 Essential (primary) hypertension: Secondary | ICD-10-CM | POA: Diagnosis not present

## 2019-02-13 DIAGNOSIS — R899 Unspecified abnormal finding in specimens from other organs, systems and tissues: Secondary | ICD-10-CM | POA: Diagnosis not present

## 2019-02-13 DIAGNOSIS — C569 Malignant neoplasm of unspecified ovary: Secondary | ICD-10-CM | POA: Diagnosis not present

## 2019-02-13 DIAGNOSIS — Z90710 Acquired absence of both cervix and uterus: Secondary | ICD-10-CM | POA: Diagnosis not present

## 2019-02-13 DIAGNOSIS — Z79899 Other long term (current) drug therapy: Secondary | ICD-10-CM | POA: Diagnosis not present

## 2019-02-13 DIAGNOSIS — Z90722 Acquired absence of ovaries, bilateral: Secondary | ICD-10-CM | POA: Diagnosis not present

## 2019-02-13 DIAGNOSIS — F329 Major depressive disorder, single episode, unspecified: Secondary | ICD-10-CM | POA: Diagnosis not present

## 2019-02-13 DIAGNOSIS — Z7982 Long term (current) use of aspirin: Secondary | ICD-10-CM | POA: Diagnosis not present

## 2019-03-01 DIAGNOSIS — E785 Hyperlipidemia, unspecified: Secondary | ICD-10-CM | POA: Diagnosis not present

## 2019-03-01 DIAGNOSIS — Z90722 Acquired absence of ovaries, bilateral: Secondary | ICD-10-CM | POA: Diagnosis not present

## 2019-03-01 DIAGNOSIS — Z90710 Acquired absence of both cervix and uterus: Secondary | ICD-10-CM | POA: Diagnosis not present

## 2019-03-01 DIAGNOSIS — Z85828 Personal history of other malignant neoplasm of skin: Secondary | ICD-10-CM | POA: Diagnosis not present

## 2019-03-01 DIAGNOSIS — C569 Malignant neoplasm of unspecified ovary: Secondary | ICD-10-CM | POA: Diagnosis not present

## 2019-03-01 DIAGNOSIS — R899 Unspecified abnormal finding in specimens from other organs, systems and tissues: Secondary | ICD-10-CM | POA: Diagnosis not present

## 2019-03-07 DIAGNOSIS — C569 Malignant neoplasm of unspecified ovary: Secondary | ICD-10-CM | POA: Diagnosis not present

## 2019-03-07 DIAGNOSIS — I10 Essential (primary) hypertension: Secondary | ICD-10-CM | POA: Diagnosis not present

## 2019-03-07 DIAGNOSIS — E782 Mixed hyperlipidemia: Secondary | ICD-10-CM | POA: Diagnosis not present

## 2019-03-07 DIAGNOSIS — Z87898 Personal history of other specified conditions: Secondary | ICD-10-CM | POA: Diagnosis not present

## 2019-03-07 DIAGNOSIS — F411 Generalized anxiety disorder: Secondary | ICD-10-CM | POA: Diagnosis not present

## 2019-03-07 DIAGNOSIS — F324 Major depressive disorder, single episode, in partial remission: Secondary | ICD-10-CM | POA: Diagnosis not present

## 2019-03-07 DIAGNOSIS — I890 Lymphedema, not elsewhere classified: Secondary | ICD-10-CM | POA: Diagnosis not present

## 2019-03-08 DIAGNOSIS — Z85828 Personal history of other malignant neoplasm of skin: Secondary | ICD-10-CM | POA: Diagnosis not present

## 2019-03-08 DIAGNOSIS — Z90722 Acquired absence of ovaries, bilateral: Secondary | ICD-10-CM | POA: Diagnosis not present

## 2019-03-08 DIAGNOSIS — R899 Unspecified abnormal finding in specimens from other organs, systems and tissues: Secondary | ICD-10-CM | POA: Diagnosis not present

## 2019-03-08 DIAGNOSIS — Z90710 Acquired absence of both cervix and uterus: Secondary | ICD-10-CM | POA: Diagnosis not present

## 2019-03-08 DIAGNOSIS — C569 Malignant neoplasm of unspecified ovary: Secondary | ICD-10-CM | POA: Diagnosis not present

## 2019-03-08 DIAGNOSIS — E785 Hyperlipidemia, unspecified: Secondary | ICD-10-CM | POA: Diagnosis not present

## 2019-03-13 ENCOUNTER — Other Ambulatory Visit: Payer: Self-pay

## 2019-03-13 ENCOUNTER — Ambulatory Visit
Admission: RE | Admit: 2019-03-13 | Discharge: 2019-03-13 | Disposition: A | Discharge status: Home / Self Care | Payer: Medicare Other | Source: Ambulatory Visit | Attending: Obstetrics and Gynecology | Admitting: Obstetrics and Gynecology

## 2019-03-13 DIAGNOSIS — Z1231 Encounter for screening mammogram for malignant neoplasm of breast: Secondary | ICD-10-CM | POA: Diagnosis not present

## 2019-03-16 DIAGNOSIS — K5900 Constipation, unspecified: Secondary | ICD-10-CM | POA: Diagnosis not present

## 2019-03-16 DIAGNOSIS — C569 Malignant neoplasm of unspecified ovary: Secondary | ICD-10-CM | POA: Diagnosis not present

## 2019-03-16 DIAGNOSIS — Z79899 Other long term (current) drug therapy: Secondary | ICD-10-CM | POA: Diagnosis not present

## 2019-03-16 DIAGNOSIS — E785 Hyperlipidemia, unspecified: Secondary | ICD-10-CM | POA: Diagnosis not present

## 2019-03-30 DIAGNOSIS — J449 Chronic obstructive pulmonary disease, unspecified: Secondary | ICD-10-CM | POA: Diagnosis not present

## 2019-03-30 DIAGNOSIS — Z79899 Other long term (current) drug therapy: Secondary | ICD-10-CM | POA: Diagnosis not present

## 2019-03-30 DIAGNOSIS — I10 Essential (primary) hypertension: Secondary | ICD-10-CM | POA: Diagnosis not present

## 2019-03-30 DIAGNOSIS — E785 Hyperlipidemia, unspecified: Secondary | ICD-10-CM | POA: Diagnosis not present

## 2019-03-30 DIAGNOSIS — C569 Malignant neoplasm of unspecified ovary: Secondary | ICD-10-CM | POA: Diagnosis not present

## 2019-03-30 DIAGNOSIS — Z87891 Personal history of nicotine dependence: Secondary | ICD-10-CM | POA: Diagnosis not present

## 2019-04-21 DIAGNOSIS — M545 Low back pain: Secondary | ICD-10-CM | POA: Diagnosis not present

## 2019-04-21 DIAGNOSIS — M47816 Spondylosis without myelopathy or radiculopathy, lumbar region: Secondary | ICD-10-CM | POA: Diagnosis not present

## 2019-04-21 DIAGNOSIS — Z8781 Personal history of (healed) traumatic fracture: Secondary | ICD-10-CM | POA: Diagnosis not present

## 2019-04-21 DIAGNOSIS — G894 Chronic pain syndrome: Secondary | ICD-10-CM | POA: Diagnosis not present

## 2019-05-17 DIAGNOSIS — Z888 Allergy status to other drugs, medicaments and biological substances status: Secondary | ICD-10-CM | POA: Diagnosis not present

## 2019-05-17 DIAGNOSIS — Z85828 Personal history of other malignant neoplasm of skin: Secondary | ICD-10-CM | POA: Diagnosis not present

## 2019-05-17 DIAGNOSIS — Z8543 Personal history of malignant neoplasm of ovary: Secondary | ICD-10-CM | POA: Diagnosis not present

## 2019-05-17 DIAGNOSIS — Z8249 Family history of ischemic heart disease and other diseases of the circulatory system: Secondary | ICD-10-CM | POA: Diagnosis not present

## 2019-05-17 DIAGNOSIS — Z810 Family history of intellectual disabilities: Secondary | ICD-10-CM | POA: Diagnosis not present

## 2019-05-17 DIAGNOSIS — Z885 Allergy status to narcotic agent status: Secondary | ICD-10-CM | POA: Diagnosis not present

## 2019-05-17 DIAGNOSIS — I341 Nonrheumatic mitral (valve) prolapse: Secondary | ICD-10-CM | POA: Diagnosis not present

## 2019-05-17 DIAGNOSIS — J449 Chronic obstructive pulmonary disease, unspecified: Secondary | ICD-10-CM | POA: Diagnosis not present

## 2019-05-17 DIAGNOSIS — I10 Essential (primary) hypertension: Secondary | ICD-10-CM | POA: Diagnosis not present

## 2019-05-17 DIAGNOSIS — E785 Hyperlipidemia, unspecified: Secondary | ICD-10-CM | POA: Diagnosis not present

## 2019-05-17 DIAGNOSIS — M47817 Spondylosis without myelopathy or radiculopathy, lumbosacral region: Secondary | ICD-10-CM | POA: Diagnosis not present

## 2019-05-17 DIAGNOSIS — Z880 Allergy status to penicillin: Secondary | ICD-10-CM | POA: Diagnosis not present

## 2019-05-17 DIAGNOSIS — K219 Gastro-esophageal reflux disease without esophagitis: Secondary | ICD-10-CM | POA: Diagnosis not present

## 2019-05-17 DIAGNOSIS — Z90710 Acquired absence of both cervix and uterus: Secondary | ICD-10-CM | POA: Diagnosis not present

## 2019-05-17 DIAGNOSIS — Z881 Allergy status to other antibiotic agents status: Secondary | ICD-10-CM | POA: Diagnosis not present

## 2019-05-17 DIAGNOSIS — Z882 Allergy status to sulfonamides status: Secondary | ICD-10-CM | POA: Diagnosis not present

## 2019-05-17 DIAGNOSIS — E669 Obesity, unspecified: Secondary | ICD-10-CM | POA: Diagnosis not present

## 2019-05-17 DIAGNOSIS — Z6826 Body mass index (BMI) 26.0-26.9, adult: Secondary | ICD-10-CM | POA: Diagnosis not present

## 2019-05-17 DIAGNOSIS — I890 Lymphedema, not elsewhere classified: Secondary | ICD-10-CM | POA: Diagnosis not present

## 2019-05-17 DIAGNOSIS — D611 Drug-induced aplastic anemia: Secondary | ICD-10-CM | POA: Diagnosis not present

## 2019-05-17 DIAGNOSIS — M545 Low back pain: Secondary | ICD-10-CM | POA: Diagnosis not present

## 2019-05-17 DIAGNOSIS — E876 Hypokalemia: Secondary | ICD-10-CM | POA: Diagnosis not present

## 2019-05-17 DIAGNOSIS — G8929 Other chronic pain: Secondary | ICD-10-CM | POA: Diagnosis not present

## 2019-05-17 DIAGNOSIS — Z8742 Personal history of other diseases of the female genital tract: Secondary | ICD-10-CM | POA: Diagnosis not present

## 2019-05-17 DIAGNOSIS — Z87891 Personal history of nicotine dependence: Secondary | ICD-10-CM | POA: Diagnosis not present

## 2019-05-19 DIAGNOSIS — C569 Malignant neoplasm of unspecified ovary: Secondary | ICD-10-CM | POA: Diagnosis not present

## 2019-05-19 DIAGNOSIS — Z7982 Long term (current) use of aspirin: Secondary | ICD-10-CM | POA: Diagnosis not present

## 2019-05-19 DIAGNOSIS — E785 Hyperlipidemia, unspecified: Secondary | ICD-10-CM | POA: Diagnosis not present

## 2019-05-19 DIAGNOSIS — Z79899 Other long term (current) drug therapy: Secondary | ICD-10-CM | POA: Diagnosis not present

## 2019-05-19 DIAGNOSIS — F419 Anxiety disorder, unspecified: Secondary | ICD-10-CM | POA: Diagnosis not present

## 2019-05-19 DIAGNOSIS — I341 Nonrheumatic mitral (valve) prolapse: Secondary | ICD-10-CM | POA: Diagnosis not present

## 2019-05-26 DIAGNOSIS — K449 Diaphragmatic hernia without obstruction or gangrene: Secondary | ICD-10-CM | POA: Diagnosis not present

## 2019-05-26 DIAGNOSIS — R899 Unspecified abnormal finding in specimens from other organs, systems and tissues: Secondary | ICD-10-CM | POA: Diagnosis not present

## 2019-05-26 DIAGNOSIS — Z8543 Personal history of malignant neoplasm of ovary: Secondary | ICD-10-CM | POA: Diagnosis not present

## 2019-05-26 DIAGNOSIS — C562 Malignant neoplasm of left ovary: Secondary | ICD-10-CM | POA: Diagnosis not present

## 2019-05-26 DIAGNOSIS — K5989 Other specified functional intestinal disorders: Secondary | ICD-10-CM | POA: Diagnosis not present

## 2019-06-01 DIAGNOSIS — F419 Anxiety disorder, unspecified: Secondary | ICD-10-CM | POA: Diagnosis not present

## 2019-06-01 DIAGNOSIS — E785 Hyperlipidemia, unspecified: Secondary | ICD-10-CM | POA: Diagnosis not present

## 2019-06-01 DIAGNOSIS — Z7982 Long term (current) use of aspirin: Secondary | ICD-10-CM | POA: Diagnosis not present

## 2019-06-01 DIAGNOSIS — C569 Malignant neoplasm of unspecified ovary: Secondary | ICD-10-CM | POA: Diagnosis not present

## 2019-06-01 DIAGNOSIS — I341 Nonrheumatic mitral (valve) prolapse: Secondary | ICD-10-CM | POA: Diagnosis not present

## 2019-06-01 DIAGNOSIS — Z79899 Other long term (current) drug therapy: Secondary | ICD-10-CM | POA: Diagnosis not present

## 2019-06-07 DIAGNOSIS — E785 Hyperlipidemia, unspecified: Secondary | ICD-10-CM | POA: Diagnosis not present

## 2019-06-07 DIAGNOSIS — I10 Essential (primary) hypertension: Secondary | ICD-10-CM | POA: Diagnosis not present

## 2019-06-07 DIAGNOSIS — M545 Low back pain: Secondary | ICD-10-CM | POA: Diagnosis not present

## 2019-06-07 DIAGNOSIS — E669 Obesity, unspecified: Secondary | ICD-10-CM | POA: Diagnosis not present

## 2019-06-07 DIAGNOSIS — M47816 Spondylosis without myelopathy or radiculopathy, lumbar region: Secondary | ICD-10-CM | POA: Diagnosis not present

## 2019-06-07 DIAGNOSIS — Z87891 Personal history of nicotine dependence: Secondary | ICD-10-CM | POA: Diagnosis not present

## 2019-06-07 DIAGNOSIS — M47817 Spondylosis without myelopathy or radiculopathy, lumbosacral region: Secondary | ICD-10-CM | POA: Diagnosis not present

## 2019-06-07 DIAGNOSIS — F419 Anxiety disorder, unspecified: Secondary | ICD-10-CM | POA: Diagnosis not present

## 2019-06-07 DIAGNOSIS — F329 Major depressive disorder, single episode, unspecified: Secondary | ICD-10-CM | POA: Diagnosis not present

## 2019-06-07 DIAGNOSIS — Z6826 Body mass index (BMI) 26.0-26.9, adult: Secondary | ICD-10-CM | POA: Diagnosis not present

## 2019-06-07 DIAGNOSIS — K219 Gastro-esophageal reflux disease without esophagitis: Secondary | ICD-10-CM | POA: Diagnosis not present

## 2019-06-07 DIAGNOSIS — J449 Chronic obstructive pulmonary disease, unspecified: Secondary | ICD-10-CM | POA: Diagnosis not present

## 2019-06-13 DIAGNOSIS — R946 Abnormal results of thyroid function studies: Secondary | ICD-10-CM | POA: Diagnosis not present

## 2019-06-28 DIAGNOSIS — M545 Low back pain: Secondary | ICD-10-CM | POA: Diagnosis not present

## 2019-06-28 DIAGNOSIS — M47817 Spondylosis without myelopathy or radiculopathy, lumbosacral region: Secondary | ICD-10-CM | POA: Diagnosis not present

## 2019-06-30 DIAGNOSIS — Z20822 Contact with and (suspected) exposure to covid-19: Secondary | ICD-10-CM | POA: Diagnosis not present

## 2019-07-10 DIAGNOSIS — J069 Acute upper respiratory infection, unspecified: Secondary | ICD-10-CM | POA: Diagnosis not present

## 2019-07-10 DIAGNOSIS — U071 COVID-19: Secondary | ICD-10-CM | POA: Diagnosis not present

## 2019-07-10 DIAGNOSIS — J029 Acute pharyngitis, unspecified: Secondary | ICD-10-CM | POA: Diagnosis not present

## 2019-07-11 ENCOUNTER — Ambulatory Visit (HOSPITAL_COMMUNITY)
Admission: RE | Admit: 2019-07-11 | Discharge: 2019-07-11 | Disposition: A | Discharge status: Home / Self Care | Payer: Medicare Other | Source: Ambulatory Visit | Attending: Pulmonary Disease | Admitting: Pulmonary Disease

## 2019-07-11 ENCOUNTER — Telehealth: Payer: Self-pay | Admitting: Unknown Physician Specialty

## 2019-07-11 ENCOUNTER — Other Ambulatory Visit: Payer: Self-pay | Admitting: Unknown Physician Specialty

## 2019-07-11 DIAGNOSIS — C569 Malignant neoplasm of unspecified ovary: Secondary | ICD-10-CM | POA: Insufficient documentation

## 2019-07-11 DIAGNOSIS — Z23 Encounter for immunization: Secondary | ICD-10-CM | POA: Insufficient documentation

## 2019-07-11 DIAGNOSIS — U071 COVID-19: Secondary | ICD-10-CM

## 2019-07-11 MED ORDER — DIPHENHYDRAMINE HCL 50 MG/ML IJ SOLN: 50.0000 mg | Freq: Once | INTRAMUSCULAR | Status: DC | PRN

## 2019-07-11 MED ORDER — METHYLPREDNISOLONE SODIUM SUCC 125 MG IJ SOLR: 125.0000 mg | Freq: Once | INTRAMUSCULAR | Status: DC | PRN

## 2019-07-11 MED ORDER — FAMOTIDINE IN NACL 20-0.9 MG/50ML-% IV SOLN: 20.0000 mg | Freq: Once | INTRAVENOUS | Status: DC | PRN

## 2019-07-11 MED ORDER — SODIUM CHLORIDE 0.9 % IV SOLN
700.0000 mg | Freq: Once | INTRAVENOUS | Status: AC
  Administered 2019-07-11: 700 mg via INTRAVENOUS
  Filled 2019-07-11: qty 20

## 2019-07-11 MED ORDER — ONDANSETRON HCL 4 MG/2ML IJ SOLN
4.0000 mg | Freq: Once | INTRAMUSCULAR | Status: AC
  Administered 2019-07-11: 4 mg via INTRAVENOUS
  Filled 2019-07-11: qty 2

## 2019-07-11 MED ORDER — EPINEPHRINE 0.3 MG/0.3ML IJ SOAJ: 0.3000 mg | Freq: Once | INTRAMUSCULAR | Status: DC | PRN

## 2019-07-11 MED ORDER — ALBUTEROL SULFATE HFA 108 (90 BASE) MCG/ACT IN AERS: 2.0000 | INHALATION_SPRAY | Freq: Once | RESPIRATORY_TRACT | Status: DC | PRN

## 2019-07-11 MED ORDER — SODIUM CHLORIDE 0.9 % IV SOLN
INTRAVENOUS | Status: DC | PRN
  Administered 2019-07-11: 15:00:00 250 mL via INTRAVENOUS

## 2019-07-11 NOTE — Progress Notes (Signed)
  Diagnosis: COVID-19  Physician: Dr. Joya Gaskins  Procedure: [facility] Med: bamlanivimab infusion - Provided patient with bamlanimivab fact sheet for patients, parents and caregivers prior to infusion.  Complications: No immediate complications noted.  Discharge: Discharged home   [name] 07/11/2019

## 2019-07-11 NOTE — Discharge Instructions (Signed)

## 2019-07-11 NOTE — Telephone Encounter (Signed)
  I connected by phone with [name] on 07/11/2019 at 7:52 AM to discuss the potential use of an new treatment for mild to moderate COVID-19 viral infection in non-hospitalized patients.  This patient is a 74 y.o. female that meets the FDA criteria for Emergency Use Authorization of bamlanivimab or casirivimab\imdevimab.  Has a (+) direct SARS-CoV-2 viral test result  Has mild or moderate COVID-19   Is ? 72 years of age and weighs ? 40 kg  Is NOT hospitalized due to COVID-19  Is NOT requiring oxygen therapy or requiring an increase in baseline oxygen flow rate due to COVID-19  Is within 10 days of symptom onset  Has at least one of the high risk factor(s) for progression to severe COVID-19 and/or hospitalization as defined in EUA.  Specific high risk criteria : >/= 72 yo   I have spoken and communicated the following to the patient or parent/caregiver:  1. FDA has authorized the emergency use of bamlanivimab and casirivimab\imdevimab for the treatment of mild to moderate COVID-19 in adults and pediatric patients with positive results of direct SARS-CoV-2 viral testing who are 75 years of age and older weighing at least 40 kg, and who are at high risk for progressing to severe COVID-19 and/or hospitalization.  2. The significant known and potential risks and benefits of bamlanivimab and casirivimab\imdevimab, and the extent to which such potential risks and benefits are unknown.  3. Information on available alternative treatments and the risks and benefits of those alternatives, including clinical trials.  4. Patients treated with bamlanivimab and casirivimab\imdevimab should continue to self-isolate and use infection control measures (e.g., wear mask, isolate, social distance, avoid sharing personal items, clean and disinfect "high touch" surfaces, and frequent handwashing) according to CDC guidelines.   5. The patient or parent/caregiver has the option to accept or refuse  bamlanivimab or casirivimab\imdevimab .  After reviewing this information with the patient, The patient agreed to proceed with receiving the bamlanimivab infusion and will be provided a copy of the Fact sheet prior to receiving the infusion. [name] 07/11/2019 7:52 AM Sx onset 2/29

## 2019-07-13 DIAGNOSIS — J449 Chronic obstructive pulmonary disease, unspecified: Secondary | ICD-10-CM | POA: Diagnosis not present

## 2019-07-13 DIAGNOSIS — R7989 Other specified abnormal findings of blood chemistry: Secondary | ICD-10-CM | POA: Diagnosis not present

## 2019-07-13 DIAGNOSIS — J9601 Acute respiratory failure with hypoxia: Secondary | ICD-10-CM | POA: Diagnosis not present

## 2019-07-13 DIAGNOSIS — R0602 Shortness of breath: Secondary | ICD-10-CM | POA: Diagnosis not present

## 2019-07-13 DIAGNOSIS — R0902 Hypoxemia: Secondary | ICD-10-CM | POA: Diagnosis not present

## 2019-07-13 DIAGNOSIS — I10 Essential (primary) hypertension: Secondary | ICD-10-CM | POA: Diagnosis not present

## 2019-07-13 DIAGNOSIS — J1282 Pneumonia due to coronavirus disease 2019: Secondary | ICD-10-CM | POA: Diagnosis not present

## 2019-07-13 DIAGNOSIS — R9431 Abnormal electrocardiogram [ECG] [EKG]: Secondary | ICD-10-CM | POA: Diagnosis not present

## 2019-07-13 DIAGNOSIS — E785 Hyperlipidemia, unspecified: Secondary | ICD-10-CM | POA: Diagnosis not present

## 2019-07-13 DIAGNOSIS — U071 COVID-19: Secondary | ICD-10-CM | POA: Diagnosis not present

## 2019-07-13 DIAGNOSIS — R918 Other nonspecific abnormal finding of lung field: Secondary | ICD-10-CM | POA: Diagnosis not present

## 2019-07-14 DIAGNOSIS — Z79899 Other long term (current) drug therapy: Secondary | ICD-10-CM | POA: Diagnosis not present

## 2019-07-14 DIAGNOSIS — Z881 Allergy status to other antibiotic agents status: Secondary | ICD-10-CM | POA: Diagnosis not present

## 2019-07-14 DIAGNOSIS — E785 Hyperlipidemia, unspecified: Secondary | ICD-10-CM | POA: Diagnosis present

## 2019-07-14 DIAGNOSIS — Z8543 Personal history of malignant neoplasm of ovary: Secondary | ICD-10-CM | POA: Diagnosis not present

## 2019-07-14 DIAGNOSIS — I10 Essential (primary) hypertension: Secondary | ICD-10-CM | POA: Diagnosis not present

## 2019-07-14 DIAGNOSIS — J1282 Pneumonia due to coronavirus disease 2019: Secondary | ICD-10-CM | POA: Diagnosis not present

## 2019-07-14 DIAGNOSIS — Z880 Allergy status to penicillin: Secondary | ICD-10-CM | POA: Diagnosis not present

## 2019-07-14 DIAGNOSIS — Z85828 Personal history of other malignant neoplasm of skin: Secondary | ICD-10-CM | POA: Diagnosis not present

## 2019-07-14 DIAGNOSIS — Z882 Allergy status to sulfonamides status: Secondary | ICD-10-CM | POA: Diagnosis not present

## 2019-07-14 DIAGNOSIS — Z885 Allergy status to narcotic agent status: Secondary | ICD-10-CM | POA: Diagnosis not present

## 2019-07-14 DIAGNOSIS — Z87891 Personal history of nicotine dependence: Secondary | ICD-10-CM | POA: Diagnosis not present

## 2019-07-14 DIAGNOSIS — Z888 Allergy status to other drugs, medicaments and biological substances status: Secondary | ICD-10-CM | POA: Diagnosis not present

## 2019-07-14 DIAGNOSIS — J9601 Acute respiratory failure with hypoxia: Secondary | ICD-10-CM | POA: Diagnosis not present

## 2019-07-14 DIAGNOSIS — R0602 Shortness of breath: Secondary | ICD-10-CM | POA: Diagnosis not present

## 2019-07-14 DIAGNOSIS — E782 Mixed hyperlipidemia: Secondary | ICD-10-CM | POA: Diagnosis not present

## 2019-07-14 DIAGNOSIS — M549 Dorsalgia, unspecified: Secondary | ICD-10-CM | POA: Diagnosis present

## 2019-07-14 DIAGNOSIS — G8929 Other chronic pain: Secondary | ICD-10-CM | POA: Diagnosis present

## 2019-07-14 DIAGNOSIS — R7989 Other specified abnormal findings of blood chemistry: Secondary | ICD-10-CM | POA: Diagnosis not present

## 2019-07-14 DIAGNOSIS — J449 Chronic obstructive pulmonary disease, unspecified: Secondary | ICD-10-CM | POA: Diagnosis not present

## 2019-07-14 DIAGNOSIS — Z7982 Long term (current) use of aspirin: Secondary | ICD-10-CM | POA: Diagnosis not present

## 2019-07-14 DIAGNOSIS — U071 COVID-19: Secondary | ICD-10-CM | POA: Diagnosis not present

## 2019-07-26 DIAGNOSIS — F411 Generalized anxiety disorder: Secondary | ICD-10-CM | POA: Diagnosis not present

## 2019-07-26 DIAGNOSIS — B370 Candidal stomatitis: Secondary | ICD-10-CM | POA: Diagnosis not present

## 2019-07-26 DIAGNOSIS — U071 COVID-19: Secondary | ICD-10-CM | POA: Diagnosis not present

## 2019-08-10 DIAGNOSIS — Z7982 Long term (current) use of aspirin: Secondary | ICD-10-CM | POA: Diagnosis not present

## 2019-08-10 DIAGNOSIS — C5702 Malignant neoplasm of left fallopian tube: Secondary | ICD-10-CM | POA: Diagnosis not present

## 2019-08-10 DIAGNOSIS — C569 Malignant neoplasm of unspecified ovary: Secondary | ICD-10-CM | POA: Diagnosis not present

## 2019-08-10 DIAGNOSIS — Z79899 Other long term (current) drug therapy: Secondary | ICD-10-CM | POA: Diagnosis not present

## 2019-08-10 DIAGNOSIS — C4492 Squamous cell carcinoma of skin, unspecified: Secondary | ICD-10-CM | POA: Diagnosis not present

## 2019-08-10 DIAGNOSIS — L309 Dermatitis, unspecified: Secondary | ICD-10-CM | POA: Diagnosis not present

## 2019-08-14 DIAGNOSIS — Z23 Encounter for immunization: Secondary | ICD-10-CM | POA: Diagnosis not present

## 2019-08-22 DIAGNOSIS — K7689 Other specified diseases of liver: Secondary | ICD-10-CM | POA: Diagnosis not present

## 2019-08-22 DIAGNOSIS — R918 Other nonspecific abnormal finding of lung field: Secondary | ICD-10-CM | POA: Diagnosis not present

## 2019-08-22 DIAGNOSIS — K449 Diaphragmatic hernia without obstruction or gangrene: Secondary | ICD-10-CM | POA: Diagnosis not present

## 2019-08-22 DIAGNOSIS — N281 Cyst of kidney, acquired: Secondary | ICD-10-CM | POA: Diagnosis not present

## 2019-08-22 DIAGNOSIS — C562 Malignant neoplasm of left ovary: Secondary | ICD-10-CM | POA: Diagnosis not present

## 2019-08-22 DIAGNOSIS — Z8543 Personal history of malignant neoplasm of ovary: Secondary | ICD-10-CM | POA: Diagnosis not present

## 2019-08-22 DIAGNOSIS — I700 Atherosclerosis of aorta: Secondary | ICD-10-CM | POA: Diagnosis not present

## 2019-08-24 DIAGNOSIS — C5702 Malignant neoplasm of left fallopian tube: Secondary | ICD-10-CM | POA: Diagnosis not present

## 2019-08-24 DIAGNOSIS — L309 Dermatitis, unspecified: Secondary | ICD-10-CM | POA: Diagnosis not present

## 2019-08-24 DIAGNOSIS — C569 Malignant neoplasm of unspecified ovary: Secondary | ICD-10-CM | POA: Diagnosis not present

## 2019-08-24 DIAGNOSIS — C4492 Squamous cell carcinoma of skin, unspecified: Secondary | ICD-10-CM | POA: Diagnosis not present

## 2019-08-24 DIAGNOSIS — Z7982 Long term (current) use of aspirin: Secondary | ICD-10-CM | POA: Diagnosis not present

## 2019-08-24 DIAGNOSIS — Z79899 Other long term (current) drug therapy: Secondary | ICD-10-CM | POA: Diagnosis not present

## 2019-09-04 DIAGNOSIS — Z23 Encounter for immunization: Secondary | ICD-10-CM | POA: Diagnosis not present

## 2019-10-24 DIAGNOSIS — Z9221 Personal history of antineoplastic chemotherapy: Secondary | ICD-10-CM | POA: Diagnosis not present

## 2019-10-24 DIAGNOSIS — Z90710 Acquired absence of both cervix and uterus: Secondary | ICD-10-CM | POA: Diagnosis not present

## 2019-10-24 DIAGNOSIS — C569 Malignant neoplasm of unspecified ovary: Secondary | ICD-10-CM | POA: Diagnosis not present

## 2019-10-24 DIAGNOSIS — Z7982 Long term (current) use of aspirin: Secondary | ICD-10-CM | POA: Diagnosis not present

## 2019-10-24 DIAGNOSIS — Z90722 Acquired absence of ovaries, bilateral: Secondary | ICD-10-CM | POA: Diagnosis not present

## 2019-10-24 DIAGNOSIS — K5900 Constipation, unspecified: Secondary | ICD-10-CM | POA: Diagnosis not present

## 2019-10-24 DIAGNOSIS — Z79899 Other long term (current) drug therapy: Secondary | ICD-10-CM | POA: Diagnosis not present

## 2019-10-24 DIAGNOSIS — Z9079 Acquired absence of other genital organ(s): Secondary | ICD-10-CM | POA: Diagnosis not present

## 2019-10-26 DIAGNOSIS — Z9079 Acquired absence of other genital organ(s): Secondary | ICD-10-CM | POA: Diagnosis not present

## 2019-10-26 DIAGNOSIS — Z90722 Acquired absence of ovaries, bilateral: Secondary | ICD-10-CM | POA: Diagnosis not present

## 2019-10-26 DIAGNOSIS — Z90710 Acquired absence of both cervix and uterus: Secondary | ICD-10-CM | POA: Diagnosis not present

## 2019-10-26 DIAGNOSIS — Z9221 Personal history of antineoplastic chemotherapy: Secondary | ICD-10-CM | POA: Diagnosis not present

## 2019-10-26 DIAGNOSIS — C569 Malignant neoplasm of unspecified ovary: Secondary | ICD-10-CM | POA: Diagnosis not present

## 2019-10-26 DIAGNOSIS — K5900 Constipation, unspecified: Secondary | ICD-10-CM | POA: Diagnosis not present

## 2019-11-22 DIAGNOSIS — C44719 Basal cell carcinoma of skin of left lower limb, including hip: Secondary | ICD-10-CM | POA: Diagnosis not present

## 2019-11-22 DIAGNOSIS — C44729 Squamous cell carcinoma of skin of left lower limb, including hip: Secondary | ICD-10-CM | POA: Diagnosis not present

## 2019-12-20 DIAGNOSIS — C569 Malignant neoplasm of unspecified ovary: Secondary | ICD-10-CM | POA: Diagnosis not present

## 2019-12-20 DIAGNOSIS — Z8679 Personal history of other diseases of the circulatory system: Secondary | ICD-10-CM | POA: Diagnosis not present

## 2019-12-20 DIAGNOSIS — I082 Rheumatic disorders of both aortic and tricuspid valves: Secondary | ICD-10-CM | POA: Diagnosis not present

## 2019-12-20 DIAGNOSIS — Z79899 Other long term (current) drug therapy: Secondary | ICD-10-CM | POA: Diagnosis not present

## 2020-01-01 DIAGNOSIS — Z23 Encounter for immunization: Secondary | ICD-10-CM | POA: Diagnosis not present

## 2020-01-11 DIAGNOSIS — Z90710 Acquired absence of both cervix and uterus: Secondary | ICD-10-CM | POA: Diagnosis not present

## 2020-01-11 DIAGNOSIS — Z79810 Long term (current) use of selective estrogen receptor modulators (SERMs): Secondary | ICD-10-CM | POA: Diagnosis not present

## 2020-01-11 DIAGNOSIS — K5900 Constipation, unspecified: Secondary | ICD-10-CM | POA: Diagnosis not present

## 2020-01-11 DIAGNOSIS — R109 Unspecified abdominal pain: Secondary | ICD-10-CM | POA: Diagnosis not present

## 2020-01-11 DIAGNOSIS — R5383 Other fatigue: Secondary | ICD-10-CM | POA: Diagnosis not present

## 2020-01-11 DIAGNOSIS — C44729 Squamous cell carcinoma of skin of left lower limb, including hip: Secondary | ICD-10-CM | POA: Diagnosis not present

## 2020-01-11 DIAGNOSIS — C569 Malignant neoplasm of unspecified ovary: Secondary | ICD-10-CM | POA: Diagnosis not present

## 2020-01-11 DIAGNOSIS — Z5111 Encounter for antineoplastic chemotherapy: Secondary | ICD-10-CM | POA: Diagnosis not present

## 2020-01-11 DIAGNOSIS — Z79899 Other long term (current) drug therapy: Secondary | ICD-10-CM | POA: Diagnosis not present

## 2020-01-11 DIAGNOSIS — Z90722 Acquired absence of ovaries, bilateral: Secondary | ICD-10-CM | POA: Diagnosis not present

## 2020-01-11 DIAGNOSIS — E785 Hyperlipidemia, unspecified: Secondary | ICD-10-CM | POA: Diagnosis not present

## 2020-01-25 DIAGNOSIS — I10 Essential (primary) hypertension: Secondary | ICD-10-CM | POA: Diagnosis not present

## 2020-01-25 DIAGNOSIS — Z85828 Personal history of other malignant neoplasm of skin: Secondary | ICD-10-CM | POA: Diagnosis not present

## 2020-01-25 DIAGNOSIS — Z885 Allergy status to narcotic agent status: Secondary | ICD-10-CM | POA: Diagnosis not present

## 2020-01-25 DIAGNOSIS — Z9889 Other specified postprocedural states: Secondary | ICD-10-CM | POA: Diagnosis not present

## 2020-01-25 DIAGNOSIS — Z7982 Long term (current) use of aspirin: Secondary | ICD-10-CM | POA: Diagnosis not present

## 2020-01-25 DIAGNOSIS — Z888 Allergy status to other drugs, medicaments and biological substances status: Secondary | ICD-10-CM | POA: Diagnosis not present

## 2020-01-25 DIAGNOSIS — F419 Anxiety disorder, unspecified: Secondary | ICD-10-CM | POA: Diagnosis not present

## 2020-01-25 DIAGNOSIS — Z881 Allergy status to other antibiotic agents status: Secondary | ICD-10-CM | POA: Diagnosis not present

## 2020-01-25 DIAGNOSIS — F329 Major depressive disorder, single episode, unspecified: Secondary | ICD-10-CM | POA: Diagnosis not present

## 2020-01-25 DIAGNOSIS — Z79899 Other long term (current) drug therapy: Secondary | ICD-10-CM | POA: Diagnosis not present

## 2020-01-25 DIAGNOSIS — E785 Hyperlipidemia, unspecified: Secondary | ICD-10-CM | POA: Diagnosis not present

## 2020-01-25 DIAGNOSIS — Z90710 Acquired absence of both cervix and uterus: Secondary | ICD-10-CM | POA: Diagnosis not present

## 2020-01-25 DIAGNOSIS — Z882 Allergy status to sulfonamides status: Secondary | ICD-10-CM | POA: Diagnosis not present

## 2020-01-25 DIAGNOSIS — C569 Malignant neoplasm of unspecified ovary: Secondary | ICD-10-CM | POA: Diagnosis not present

## 2020-01-25 DIAGNOSIS — T82528A Displacement of other cardiac and vascular devices and implants, initial encounter: Secondary | ICD-10-CM | POA: Diagnosis not present

## 2020-01-25 DIAGNOSIS — I890 Lymphedema, not elsewhere classified: Secondary | ICD-10-CM | POA: Diagnosis not present

## 2020-01-25 DIAGNOSIS — J449 Chronic obstructive pulmonary disease, unspecified: Secondary | ICD-10-CM | POA: Diagnosis not present

## 2020-01-25 DIAGNOSIS — M810 Age-related osteoporosis without current pathological fracture: Secondary | ICD-10-CM | POA: Diagnosis not present

## 2020-01-25 DIAGNOSIS — K219 Gastro-esophageal reflux disease without esophagitis: Secondary | ICD-10-CM | POA: Diagnosis not present

## 2020-01-25 DIAGNOSIS — Z880 Allergy status to penicillin: Secondary | ICD-10-CM | POA: Diagnosis not present

## 2020-01-25 DIAGNOSIS — Z9221 Personal history of antineoplastic chemotherapy: Secondary | ICD-10-CM | POA: Diagnosis not present

## 2020-01-25 DIAGNOSIS — I341 Nonrheumatic mitral (valve) prolapse: Secondary | ICD-10-CM | POA: Diagnosis not present

## 2020-01-25 DIAGNOSIS — Z87891 Personal history of nicotine dependence: Secondary | ICD-10-CM | POA: Diagnosis not present

## 2020-01-25 DIAGNOSIS — C799 Secondary malignant neoplasm of unspecified site: Secondary | ICD-10-CM | POA: Diagnosis not present

## 2020-01-25 DIAGNOSIS — T82524A Displacement of infusion catheter, initial encounter: Secondary | ICD-10-CM | POA: Diagnosis not present

## 2020-02-02 DIAGNOSIS — I10 Essential (primary) hypertension: Secondary | ICD-10-CM | POA: Diagnosis not present

## 2020-02-02 DIAGNOSIS — E782 Mixed hyperlipidemia: Secondary | ICD-10-CM | POA: Diagnosis not present

## 2020-02-02 DIAGNOSIS — I890 Lymphedema, not elsewhere classified: Secondary | ICD-10-CM | POA: Diagnosis not present

## 2020-02-02 DIAGNOSIS — K219 Gastro-esophageal reflux disease without esophagitis: Secondary | ICD-10-CM | POA: Diagnosis not present

## 2020-02-02 DIAGNOSIS — F324 Major depressive disorder, single episode, in partial remission: Secondary | ICD-10-CM | POA: Diagnosis not present

## 2020-02-02 DIAGNOSIS — C569 Malignant neoplasm of unspecified ovary: Secondary | ICD-10-CM | POA: Diagnosis not present

## 2020-02-02 DIAGNOSIS — F411 Generalized anxiety disorder: Secondary | ICD-10-CM | POA: Diagnosis not present

## 2020-02-06 DIAGNOSIS — Z5111 Encounter for antineoplastic chemotherapy: Secondary | ICD-10-CM | POA: Diagnosis not present

## 2020-02-06 DIAGNOSIS — Z90710 Acquired absence of both cervix and uterus: Secondary | ICD-10-CM | POA: Diagnosis not present

## 2020-02-06 DIAGNOSIS — E785 Hyperlipidemia, unspecified: Secondary | ICD-10-CM | POA: Diagnosis not present

## 2020-02-06 DIAGNOSIS — C44729 Squamous cell carcinoma of skin of left lower limb, including hip: Secondary | ICD-10-CM | POA: Diagnosis not present

## 2020-02-06 DIAGNOSIS — Z79810 Long term (current) use of selective estrogen receptor modulators (SERMs): Secondary | ICD-10-CM | POA: Diagnosis not present

## 2020-02-06 DIAGNOSIS — C569 Malignant neoplasm of unspecified ovary: Secondary | ICD-10-CM | POA: Diagnosis not present

## 2020-02-07 DIAGNOSIS — T82524A Displacement of infusion catheter, initial encounter: Secondary | ICD-10-CM | POA: Diagnosis not present

## 2020-02-07 DIAGNOSIS — C569 Malignant neoplasm of unspecified ovary: Secondary | ICD-10-CM | POA: Diagnosis not present

## 2020-02-07 DIAGNOSIS — T82528A Displacement of other cardiac and vascular devices and implants, initial encounter: Secondary | ICD-10-CM | POA: Diagnosis not present

## 2020-02-07 DIAGNOSIS — Z452 Encounter for adjustment and management of vascular access device: Secondary | ICD-10-CM | POA: Diagnosis not present

## 2020-02-08 DIAGNOSIS — Z5111 Encounter for antineoplastic chemotherapy: Secondary | ICD-10-CM | POA: Diagnosis not present

## 2020-02-08 DIAGNOSIS — Z79810 Long term (current) use of selective estrogen receptor modulators (SERMs): Secondary | ICD-10-CM | POA: Diagnosis not present

## 2020-02-08 DIAGNOSIS — C44729 Squamous cell carcinoma of skin of left lower limb, including hip: Secondary | ICD-10-CM | POA: Diagnosis not present

## 2020-02-08 DIAGNOSIS — C569 Malignant neoplasm of unspecified ovary: Secondary | ICD-10-CM | POA: Diagnosis not present

## 2020-02-08 DIAGNOSIS — E785 Hyperlipidemia, unspecified: Secondary | ICD-10-CM | POA: Diagnosis not present

## 2020-02-08 DIAGNOSIS — Z90710 Acquired absence of both cervix and uterus: Secondary | ICD-10-CM | POA: Diagnosis not present

## 2020-02-22 DIAGNOSIS — C569 Malignant neoplasm of unspecified ovary: Secondary | ICD-10-CM | POA: Diagnosis not present

## 2020-03-06 DIAGNOSIS — R2689 Other abnormalities of gait and mobility: Secondary | ICD-10-CM | POA: Diagnosis not present

## 2020-03-06 DIAGNOSIS — R2681 Unsteadiness on feet: Secondary | ICD-10-CM | POA: Diagnosis not present

## 2020-03-06 DIAGNOSIS — C569 Malignant neoplasm of unspecified ovary: Secondary | ICD-10-CM | POA: Diagnosis not present

## 2020-03-14 DIAGNOSIS — Z881 Allergy status to other antibiotic agents status: Secondary | ICD-10-CM | POA: Diagnosis not present

## 2020-03-14 DIAGNOSIS — C569 Malignant neoplasm of unspecified ovary: Secondary | ICD-10-CM | POA: Diagnosis not present

## 2020-03-14 DIAGNOSIS — G8929 Other chronic pain: Secondary | ICD-10-CM | POA: Diagnosis not present

## 2020-03-14 DIAGNOSIS — Z885 Allergy status to narcotic agent status: Secondary | ICD-10-CM | POA: Diagnosis not present

## 2020-03-14 DIAGNOSIS — T451X5A Adverse effect of antineoplastic and immunosuppressive drugs, initial encounter: Secondary | ICD-10-CM | POA: Diagnosis not present

## 2020-03-14 DIAGNOSIS — Z90710 Acquired absence of both cervix and uterus: Secondary | ICD-10-CM | POA: Diagnosis not present

## 2020-03-14 DIAGNOSIS — Z880 Allergy status to penicillin: Secondary | ICD-10-CM | POA: Diagnosis not present

## 2020-03-14 DIAGNOSIS — Z85828 Personal history of other malignant neoplasm of skin: Secondary | ICD-10-CM | POA: Diagnosis not present

## 2020-03-14 DIAGNOSIS — L270 Generalized skin eruption due to drugs and medicaments taken internally: Secondary | ICD-10-CM | POA: Diagnosis not present

## 2020-03-14 DIAGNOSIS — Z79899 Other long term (current) drug therapy: Secondary | ICD-10-CM | POA: Diagnosis not present

## 2020-03-14 DIAGNOSIS — Z888 Allergy status to other drugs, medicaments and biological substances status: Secondary | ICD-10-CM | POA: Diagnosis not present

## 2020-03-14 DIAGNOSIS — M545 Low back pain, unspecified: Secondary | ICD-10-CM | POA: Diagnosis not present

## 2020-03-14 DIAGNOSIS — Z882 Allergy status to sulfonamides status: Secondary | ICD-10-CM | POA: Diagnosis not present

## 2020-03-14 DIAGNOSIS — E785 Hyperlipidemia, unspecified: Secondary | ICD-10-CM | POA: Diagnosis not present

## 2020-03-14 DIAGNOSIS — Z7982 Long term (current) use of aspirin: Secondary | ICD-10-CM | POA: Diagnosis not present

## 2020-03-14 DIAGNOSIS — Z5111 Encounter for antineoplastic chemotherapy: Secondary | ICD-10-CM | POA: Diagnosis not present

## 2020-03-21 DIAGNOSIS — E785 Hyperlipidemia, unspecified: Secondary | ICD-10-CM | POA: Diagnosis not present

## 2020-03-21 DIAGNOSIS — L270 Generalized skin eruption due to drugs and medicaments taken internally: Secondary | ICD-10-CM | POA: Diagnosis not present

## 2020-03-21 DIAGNOSIS — Z5111 Encounter for antineoplastic chemotherapy: Secondary | ICD-10-CM | POA: Diagnosis not present

## 2020-03-21 DIAGNOSIS — T451X5A Adverse effect of antineoplastic and immunosuppressive drugs, initial encounter: Secondary | ICD-10-CM | POA: Diagnosis not present

## 2020-03-21 DIAGNOSIS — M545 Low back pain, unspecified: Secondary | ICD-10-CM | POA: Diagnosis not present

## 2020-03-21 DIAGNOSIS — C569 Malignant neoplasm of unspecified ovary: Secondary | ICD-10-CM | POA: Diagnosis not present

## 2020-04-11 DIAGNOSIS — C569 Malignant neoplasm of unspecified ovary: Secondary | ICD-10-CM | POA: Diagnosis not present

## 2020-04-11 DIAGNOSIS — K5900 Constipation, unspecified: Secondary | ICD-10-CM | POA: Diagnosis not present

## 2020-04-11 DIAGNOSIS — Z5111 Encounter for antineoplastic chemotherapy: Secondary | ICD-10-CM | POA: Diagnosis not present

## 2020-04-11 DIAGNOSIS — L270 Generalized skin eruption due to drugs and medicaments taken internally: Secondary | ICD-10-CM | POA: Diagnosis not present

## 2020-04-16 ENCOUNTER — Other Ambulatory Visit: Payer: Self-pay | Admitting: Obstetrics and Gynecology

## 2020-04-16 DIAGNOSIS — Z1231 Encounter for screening mammogram for malignant neoplasm of breast: Secondary | ICD-10-CM

## 2020-04-18 DIAGNOSIS — Z5111 Encounter for antineoplastic chemotherapy: Secondary | ICD-10-CM | POA: Diagnosis not present

## 2020-04-18 DIAGNOSIS — L270 Generalized skin eruption due to drugs and medicaments taken internally: Secondary | ICD-10-CM | POA: Diagnosis not present

## 2020-04-18 DIAGNOSIS — C569 Malignant neoplasm of unspecified ovary: Secondary | ICD-10-CM | POA: Diagnosis not present

## 2020-04-18 DIAGNOSIS — K5900 Constipation, unspecified: Secondary | ICD-10-CM | POA: Diagnosis not present

## 2020-04-19 ENCOUNTER — Ambulatory Visit: Payer: Medicare Other

## 2020-04-22 ENCOUNTER — Ambulatory Visit: Payer: Medicare Other

## 2020-05-06 DIAGNOSIS — K5900 Constipation, unspecified: Secondary | ICD-10-CM | POA: Diagnosis not present

## 2020-05-06 DIAGNOSIS — Z5111 Encounter for antineoplastic chemotherapy: Secondary | ICD-10-CM | POA: Diagnosis not present

## 2020-05-06 DIAGNOSIS — L270 Generalized skin eruption due to drugs and medicaments taken internally: Secondary | ICD-10-CM | POA: Diagnosis not present

## 2020-05-06 DIAGNOSIS — C569 Malignant neoplasm of unspecified ovary: Secondary | ICD-10-CM | POA: Diagnosis not present

## 2020-05-15 DIAGNOSIS — R971 Elevated cancer antigen 125 [CA 125]: Secondary | ICD-10-CM | POA: Diagnosis not present

## 2020-05-15 DIAGNOSIS — C569 Malignant neoplasm of unspecified ovary: Secondary | ICD-10-CM | POA: Diagnosis not present

## 2020-05-15 DIAGNOSIS — N2889 Other specified disorders of kidney and ureter: Secondary | ICD-10-CM | POA: Diagnosis not present

## 2020-05-15 DIAGNOSIS — K5900 Constipation, unspecified: Secondary | ICD-10-CM | POA: Diagnosis not present

## 2020-05-15 DIAGNOSIS — K449 Diaphragmatic hernia without obstruction or gangrene: Secondary | ICD-10-CM | POA: Diagnosis not present

## 2020-05-16 DIAGNOSIS — K449 Diaphragmatic hernia without obstruction or gangrene: Secondary | ICD-10-CM | POA: Diagnosis not present

## 2020-05-16 DIAGNOSIS — Z5111 Encounter for antineoplastic chemotherapy: Secondary | ICD-10-CM | POA: Diagnosis not present

## 2020-05-16 DIAGNOSIS — C569 Malignant neoplasm of unspecified ovary: Secondary | ICD-10-CM | POA: Diagnosis not present

## 2020-05-16 DIAGNOSIS — T451X5A Adverse effect of antineoplastic and immunosuppressive drugs, initial encounter: Secondary | ICD-10-CM | POA: Diagnosis not present

## 2020-05-16 DIAGNOSIS — Z79899 Other long term (current) drug therapy: Secondary | ICD-10-CM | POA: Diagnosis not present

## 2020-05-16 DIAGNOSIS — L270 Generalized skin eruption due to drugs and medicaments taken internally: Secondary | ICD-10-CM | POA: Diagnosis not present

## 2020-05-31 ENCOUNTER — Other Ambulatory Visit: Payer: Self-pay

## 2020-05-31 ENCOUNTER — Ambulatory Visit
Admission: RE | Admit: 2020-05-31 | Discharge: 2020-05-31 | Disposition: A | Discharge status: Home / Self Care | Payer: Medicare Other | Source: Ambulatory Visit | Attending: Obstetrics and Gynecology | Admitting: Obstetrics and Gynecology

## 2020-05-31 DIAGNOSIS — Z1231 Encounter for screening mammogram for malignant neoplasm of breast: Secondary | ICD-10-CM

## 2020-06-05 DIAGNOSIS — F411 Generalized anxiety disorder: Secondary | ICD-10-CM | POA: Diagnosis not present

## 2020-06-05 DIAGNOSIS — C569 Malignant neoplasm of unspecified ovary: Secondary | ICD-10-CM | POA: Diagnosis not present

## 2020-06-05 DIAGNOSIS — F324 Major depressive disorder, single episode, in partial remission: Secondary | ICD-10-CM | POA: Diagnosis not present

## 2020-06-06 DIAGNOSIS — K449 Diaphragmatic hernia without obstruction or gangrene: Secondary | ICD-10-CM | POA: Diagnosis not present

## 2020-06-06 DIAGNOSIS — C569 Malignant neoplasm of unspecified ovary: Secondary | ICD-10-CM | POA: Diagnosis not present

## 2020-06-06 DIAGNOSIS — T451X5A Adverse effect of antineoplastic and immunosuppressive drugs, initial encounter: Secondary | ICD-10-CM | POA: Diagnosis not present

## 2020-06-06 DIAGNOSIS — Z79899 Other long term (current) drug therapy: Secondary | ICD-10-CM | POA: Diagnosis not present

## 2020-06-06 DIAGNOSIS — L270 Generalized skin eruption due to drugs and medicaments taken internally: Secondary | ICD-10-CM | POA: Diagnosis not present

## 2020-06-06 DIAGNOSIS — Z5111 Encounter for antineoplastic chemotherapy: Secondary | ICD-10-CM | POA: Diagnosis not present

## 2020-06-26 DIAGNOSIS — C569 Malignant neoplasm of unspecified ovary: Secondary | ICD-10-CM | POA: Diagnosis not present

## 2020-06-26 DIAGNOSIS — Z5111 Encounter for antineoplastic chemotherapy: Secondary | ICD-10-CM | POA: Diagnosis not present

## 2020-06-26 DIAGNOSIS — K449 Diaphragmatic hernia without obstruction or gangrene: Secondary | ICD-10-CM | POA: Diagnosis not present

## 2020-06-27 DIAGNOSIS — C569 Malignant neoplasm of unspecified ovary: Secondary | ICD-10-CM | POA: Diagnosis not present

## 2020-06-27 DIAGNOSIS — K449 Diaphragmatic hernia without obstruction or gangrene: Secondary | ICD-10-CM | POA: Diagnosis not present

## 2020-06-27 DIAGNOSIS — Z5111 Encounter for antineoplastic chemotherapy: Secondary | ICD-10-CM | POA: Diagnosis not present

## 2020-07-04 DIAGNOSIS — C569 Malignant neoplasm of unspecified ovary: Secondary | ICD-10-CM | POA: Diagnosis not present

## 2020-07-04 DIAGNOSIS — Z5111 Encounter for antineoplastic chemotherapy: Secondary | ICD-10-CM | POA: Diagnosis not present

## 2020-07-18 DIAGNOSIS — Z79899 Other long term (current) drug therapy: Secondary | ICD-10-CM | POA: Diagnosis not present

## 2020-07-18 DIAGNOSIS — E785 Hyperlipidemia, unspecified: Secondary | ICD-10-CM | POA: Diagnosis not present

## 2020-07-18 DIAGNOSIS — Z90710 Acquired absence of both cervix and uterus: Secondary | ICD-10-CM | POA: Diagnosis not present

## 2020-07-18 DIAGNOSIS — Z7982 Long term (current) use of aspirin: Secondary | ICD-10-CM | POA: Diagnosis not present

## 2020-07-18 DIAGNOSIS — T451X5A Adverse effect of antineoplastic and immunosuppressive drugs, initial encounter: Secondary | ICD-10-CM | POA: Diagnosis not present

## 2020-07-18 DIAGNOSIS — K449 Diaphragmatic hernia without obstruction or gangrene: Secondary | ICD-10-CM | POA: Diagnosis not present

## 2020-07-18 DIAGNOSIS — Z85828 Personal history of other malignant neoplasm of skin: Secondary | ICD-10-CM | POA: Diagnosis not present

## 2020-07-18 DIAGNOSIS — C569 Malignant neoplasm of unspecified ovary: Secondary | ICD-10-CM | POA: Diagnosis not present

## 2020-07-18 DIAGNOSIS — Z90722 Acquired absence of ovaries, bilateral: Secondary | ICD-10-CM | POA: Diagnosis not present

## 2020-07-18 DIAGNOSIS — Z5111 Encounter for antineoplastic chemotherapy: Secondary | ICD-10-CM | POA: Diagnosis not present

## 2020-07-18 DIAGNOSIS — D701 Agranulocytosis secondary to cancer chemotherapy: Secondary | ICD-10-CM | POA: Diagnosis not present

## 2020-07-25 DIAGNOSIS — Z5111 Encounter for antineoplastic chemotherapy: Secondary | ICD-10-CM | POA: Diagnosis not present

## 2020-07-25 DIAGNOSIS — Z90710 Acquired absence of both cervix and uterus: Secondary | ICD-10-CM | POA: Diagnosis not present

## 2020-07-25 DIAGNOSIS — C569 Malignant neoplasm of unspecified ovary: Secondary | ICD-10-CM | POA: Diagnosis not present

## 2020-07-25 DIAGNOSIS — Z90722 Acquired absence of ovaries, bilateral: Secondary | ICD-10-CM | POA: Diagnosis not present

## 2020-07-25 DIAGNOSIS — E785 Hyperlipidemia, unspecified: Secondary | ICD-10-CM | POA: Diagnosis not present

## 2020-07-25 DIAGNOSIS — Z85828 Personal history of other malignant neoplasm of skin: Secondary | ICD-10-CM | POA: Diagnosis not present

## 2020-08-01 DIAGNOSIS — E785 Hyperlipidemia, unspecified: Secondary | ICD-10-CM | POA: Diagnosis not present

## 2020-08-01 DIAGNOSIS — Z5111 Encounter for antineoplastic chemotherapy: Secondary | ICD-10-CM | POA: Diagnosis not present

## 2020-08-01 DIAGNOSIS — C569 Malignant neoplasm of unspecified ovary: Secondary | ICD-10-CM | POA: Diagnosis not present

## 2020-08-01 DIAGNOSIS — Z90722 Acquired absence of ovaries, bilateral: Secondary | ICD-10-CM | POA: Diagnosis not present

## 2020-08-01 DIAGNOSIS — Z85828 Personal history of other malignant neoplasm of skin: Secondary | ICD-10-CM | POA: Diagnosis not present

## 2020-08-01 DIAGNOSIS — Z90710 Acquired absence of both cervix and uterus: Secondary | ICD-10-CM | POA: Diagnosis not present

## 2020-08-08 DIAGNOSIS — C569 Malignant neoplasm of unspecified ovary: Secondary | ICD-10-CM | POA: Diagnosis not present

## 2020-08-08 DIAGNOSIS — Z90722 Acquired absence of ovaries, bilateral: Secondary | ICD-10-CM | POA: Diagnosis not present

## 2020-08-08 DIAGNOSIS — Z5111 Encounter for antineoplastic chemotherapy: Secondary | ICD-10-CM | POA: Diagnosis not present

## 2020-08-08 DIAGNOSIS — Z85828 Personal history of other malignant neoplasm of skin: Secondary | ICD-10-CM | POA: Diagnosis not present

## 2020-08-08 DIAGNOSIS — Z90710 Acquired absence of both cervix and uterus: Secondary | ICD-10-CM | POA: Diagnosis not present

## 2020-08-08 DIAGNOSIS — E785 Hyperlipidemia, unspecified: Secondary | ICD-10-CM | POA: Diagnosis not present

## 2020-08-09 DIAGNOSIS — C569 Malignant neoplasm of unspecified ovary: Secondary | ICD-10-CM | POA: Diagnosis not present

## 2020-08-22 DIAGNOSIS — C569 Malignant neoplasm of unspecified ovary: Secondary | ICD-10-CM | POA: Diagnosis not present

## 2020-08-22 DIAGNOSIS — Z5111 Encounter for antineoplastic chemotherapy: Secondary | ICD-10-CM | POA: Diagnosis not present

## 2020-08-29 DIAGNOSIS — C569 Malignant neoplasm of unspecified ovary: Secondary | ICD-10-CM | POA: Diagnosis not present

## 2020-08-30 DIAGNOSIS — D611 Drug-induced aplastic anemia: Secondary | ICD-10-CM | POA: Diagnosis not present

## 2020-08-30 DIAGNOSIS — C569 Malignant neoplasm of unspecified ovary: Secondary | ICD-10-CM | POA: Diagnosis not present

## 2020-08-30 DIAGNOSIS — Z452 Encounter for adjustment and management of vascular access device: Secondary | ICD-10-CM | POA: Diagnosis not present

## 2020-09-05 DIAGNOSIS — Z5111 Encounter for antineoplastic chemotherapy: Secondary | ICD-10-CM | POA: Diagnosis not present

## 2020-09-05 DIAGNOSIS — C569 Malignant neoplasm of unspecified ovary: Secondary | ICD-10-CM | POA: Diagnosis not present

## 2020-09-12 DIAGNOSIS — D6481 Anemia due to antineoplastic chemotherapy: Secondary | ICD-10-CM | POA: Diagnosis not present

## 2020-09-12 DIAGNOSIS — Z7982 Long term (current) use of aspirin: Secondary | ICD-10-CM | POA: Diagnosis not present

## 2020-09-12 DIAGNOSIS — T451X5A Adverse effect of antineoplastic and immunosuppressive drugs, initial encounter: Secondary | ICD-10-CM | POA: Diagnosis not present

## 2020-09-12 DIAGNOSIS — C562 Malignant neoplasm of left ovary: Secondary | ICD-10-CM | POA: Diagnosis not present

## 2020-09-12 DIAGNOSIS — Z79899 Other long term (current) drug therapy: Secondary | ICD-10-CM | POA: Diagnosis not present

## 2020-09-12 DIAGNOSIS — Z90722 Acquired absence of ovaries, bilateral: Secondary | ICD-10-CM | POA: Diagnosis not present

## 2020-09-12 DIAGNOSIS — E785 Hyperlipidemia, unspecified: Secondary | ICD-10-CM | POA: Diagnosis not present

## 2020-09-12 DIAGNOSIS — Z5111 Encounter for antineoplastic chemotherapy: Secondary | ICD-10-CM | POA: Diagnosis not present

## 2020-09-12 DIAGNOSIS — D701 Agranulocytosis secondary to cancer chemotherapy: Secondary | ICD-10-CM | POA: Diagnosis not present

## 2020-09-12 DIAGNOSIS — Z79810 Long term (current) use of selective estrogen receptor modulators (SERMs): Secondary | ICD-10-CM | POA: Diagnosis not present

## 2020-09-12 DIAGNOSIS — Z90710 Acquired absence of both cervix and uterus: Secondary | ICD-10-CM | POA: Diagnosis not present

## 2020-09-12 DIAGNOSIS — Z85828 Personal history of other malignant neoplasm of skin: Secondary | ICD-10-CM | POA: Diagnosis not present

## 2020-09-13 DIAGNOSIS — C569 Malignant neoplasm of unspecified ovary: Secondary | ICD-10-CM | POA: Diagnosis not present

## 2020-09-24 DIAGNOSIS — J984 Other disorders of lung: Secondary | ICD-10-CM | POA: Diagnosis not present

## 2020-09-24 DIAGNOSIS — K449 Diaphragmatic hernia without obstruction or gangrene: Secondary | ICD-10-CM | POA: Diagnosis not present

## 2020-09-24 DIAGNOSIS — J8410 Pulmonary fibrosis, unspecified: Secondary | ICD-10-CM | POA: Diagnosis not present

## 2020-09-24 DIAGNOSIS — C569 Malignant neoplasm of unspecified ovary: Secondary | ICD-10-CM | POA: Diagnosis not present

## 2020-09-24 DIAGNOSIS — C562 Malignant neoplasm of left ovary: Secondary | ICD-10-CM | POA: Diagnosis not present

## 2020-09-24 DIAGNOSIS — Z90710 Acquired absence of both cervix and uterus: Secondary | ICD-10-CM | POA: Diagnosis not present

## 2020-09-26 DIAGNOSIS — Z5111 Encounter for antineoplastic chemotherapy: Secondary | ICD-10-CM | POA: Diagnosis not present

## 2020-09-26 DIAGNOSIS — Z85828 Personal history of other malignant neoplasm of skin: Secondary | ICD-10-CM | POA: Diagnosis not present

## 2020-09-26 DIAGNOSIS — D701 Agranulocytosis secondary to cancer chemotherapy: Secondary | ICD-10-CM | POA: Diagnosis not present

## 2020-09-26 DIAGNOSIS — Z79899 Other long term (current) drug therapy: Secondary | ICD-10-CM | POA: Diagnosis not present

## 2020-09-26 DIAGNOSIS — T451X5A Adverse effect of antineoplastic and immunosuppressive drugs, initial encounter: Secondary | ICD-10-CM | POA: Diagnosis not present

## 2020-09-26 DIAGNOSIS — E785 Hyperlipidemia, unspecified: Secondary | ICD-10-CM | POA: Diagnosis not present

## 2020-09-26 DIAGNOSIS — D6481 Anemia due to antineoplastic chemotherapy: Secondary | ICD-10-CM | POA: Diagnosis not present

## 2020-09-26 DIAGNOSIS — C569 Malignant neoplasm of unspecified ovary: Secondary | ICD-10-CM | POA: Diagnosis not present

## 2020-09-26 DIAGNOSIS — Z7982 Long term (current) use of aspirin: Secondary | ICD-10-CM | POA: Diagnosis not present

## 2020-10-10 DIAGNOSIS — Z882 Allergy status to sulfonamides status: Secondary | ICD-10-CM | POA: Diagnosis not present

## 2020-10-10 DIAGNOSIS — Z880 Allergy status to penicillin: Secondary | ICD-10-CM | POA: Diagnosis not present

## 2020-10-10 DIAGNOSIS — F419 Anxiety disorder, unspecified: Secondary | ICD-10-CM | POA: Diagnosis not present

## 2020-10-10 DIAGNOSIS — Z885 Allergy status to narcotic agent status: Secondary | ICD-10-CM | POA: Diagnosis not present

## 2020-10-10 DIAGNOSIS — Z90710 Acquired absence of both cervix and uterus: Secondary | ICD-10-CM | POA: Diagnosis not present

## 2020-10-10 DIAGNOSIS — Z79899 Other long term (current) drug therapy: Secondary | ICD-10-CM | POA: Diagnosis not present

## 2020-10-10 DIAGNOSIS — Z90722 Acquired absence of ovaries, bilateral: Secondary | ICD-10-CM | POA: Diagnosis not present

## 2020-10-10 DIAGNOSIS — Z85828 Personal history of other malignant neoplasm of skin: Secondary | ICD-10-CM | POA: Diagnosis not present

## 2020-10-10 DIAGNOSIS — Z7982 Long term (current) use of aspirin: Secondary | ICD-10-CM | POA: Diagnosis not present

## 2020-10-10 DIAGNOSIS — T451X5A Adverse effect of antineoplastic and immunosuppressive drugs, initial encounter: Secondary | ICD-10-CM | POA: Diagnosis not present

## 2020-10-10 DIAGNOSIS — D649 Anemia, unspecified: Secondary | ICD-10-CM | POA: Diagnosis not present

## 2020-10-10 DIAGNOSIS — G8929 Other chronic pain: Secondary | ICD-10-CM | POA: Diagnosis not present

## 2020-10-10 DIAGNOSIS — Z888 Allergy status to other drugs, medicaments and biological substances status: Secondary | ICD-10-CM | POA: Diagnosis not present

## 2020-10-10 DIAGNOSIS — E785 Hyperlipidemia, unspecified: Secondary | ICD-10-CM | POA: Diagnosis not present

## 2020-10-10 DIAGNOSIS — R195 Other fecal abnormalities: Secondary | ICD-10-CM | POA: Diagnosis not present

## 2020-10-10 DIAGNOSIS — I890 Lymphedema, not elsewhere classified: Secondary | ICD-10-CM | POA: Diagnosis not present

## 2020-10-10 DIAGNOSIS — I341 Nonrheumatic mitral (valve) prolapse: Secondary | ICD-10-CM | POA: Diagnosis not present

## 2020-10-10 DIAGNOSIS — Z5111 Encounter for antineoplastic chemotherapy: Secondary | ICD-10-CM | POA: Diagnosis not present

## 2020-10-10 DIAGNOSIS — D701 Agranulocytosis secondary to cancer chemotherapy: Secondary | ICD-10-CM | POA: Diagnosis not present

## 2020-10-10 DIAGNOSIS — C569 Malignant neoplasm of unspecified ovary: Secondary | ICD-10-CM | POA: Diagnosis not present

## 2020-10-10 DIAGNOSIS — M545 Low back pain, unspecified: Secondary | ICD-10-CM | POA: Diagnosis not present

## 2020-10-11 DIAGNOSIS — Z452 Encounter for adjustment and management of vascular access device: Secondary | ICD-10-CM | POA: Diagnosis not present

## 2020-10-11 DIAGNOSIS — C569 Malignant neoplasm of unspecified ovary: Secondary | ICD-10-CM | POA: Diagnosis not present

## 2020-10-24 DIAGNOSIS — Z5111 Encounter for antineoplastic chemotherapy: Secondary | ICD-10-CM | POA: Diagnosis not present

## 2020-10-24 DIAGNOSIS — C569 Malignant neoplasm of unspecified ovary: Secondary | ICD-10-CM | POA: Diagnosis not present

## 2020-11-06 DIAGNOSIS — Z90710 Acquired absence of both cervix and uterus: Secondary | ICD-10-CM | POA: Diagnosis not present

## 2020-11-06 DIAGNOSIS — Z87891 Personal history of nicotine dependence: Secondary | ICD-10-CM | POA: Diagnosis not present

## 2020-11-06 DIAGNOSIS — R110 Nausea: Secondary | ICD-10-CM | POA: Diagnosis not present

## 2020-11-06 DIAGNOSIS — Z888 Allergy status to other drugs, medicaments and biological substances status: Secondary | ICD-10-CM | POA: Diagnosis not present

## 2020-11-06 DIAGNOSIS — R140 Abdominal distension (gaseous): Secondary | ICD-10-CM | POA: Diagnosis not present

## 2020-11-06 DIAGNOSIS — K56600 Partial intestinal obstruction, unspecified as to cause: Secondary | ICD-10-CM | POA: Diagnosis not present

## 2020-11-06 DIAGNOSIS — Z882 Allergy status to sulfonamides status: Secondary | ICD-10-CM | POA: Diagnosis not present

## 2020-11-06 DIAGNOSIS — Z79899 Other long term (current) drug therapy: Secondary | ICD-10-CM | POA: Diagnosis not present

## 2020-11-06 DIAGNOSIS — Z880 Allergy status to penicillin: Secondary | ICD-10-CM | POA: Diagnosis not present

## 2020-11-06 DIAGNOSIS — R109 Unspecified abdominal pain: Secondary | ICD-10-CM | POA: Diagnosis not present

## 2020-11-06 DIAGNOSIS — R1084 Generalized abdominal pain: Secondary | ICD-10-CM | POA: Diagnosis not present

## 2020-11-06 DIAGNOSIS — R188 Other ascites: Secondary | ICD-10-CM | POA: Diagnosis not present

## 2020-11-06 DIAGNOSIS — Z881 Allergy status to other antibiotic agents status: Secondary | ICD-10-CM | POA: Diagnosis not present

## 2020-11-06 DIAGNOSIS — Z7982 Long term (current) use of aspirin: Secondary | ICD-10-CM | POA: Diagnosis not present

## 2020-11-06 DIAGNOSIS — K56609 Unspecified intestinal obstruction, unspecified as to partial versus complete obstruction: Secondary | ICD-10-CM | POA: Diagnosis not present

## 2020-11-08 DIAGNOSIS — R109 Unspecified abdominal pain: Secondary | ICD-10-CM | POA: Diagnosis not present

## 2020-11-14 DIAGNOSIS — D701 Agranulocytosis secondary to cancer chemotherapy: Secondary | ICD-10-CM | POA: Diagnosis not present

## 2020-11-14 DIAGNOSIS — Z5111 Encounter for antineoplastic chemotherapy: Secondary | ICD-10-CM | POA: Diagnosis not present

## 2020-11-14 DIAGNOSIS — T451X5A Adverse effect of antineoplastic and immunosuppressive drugs, initial encounter: Secondary | ICD-10-CM | POA: Diagnosis not present

## 2020-11-14 DIAGNOSIS — C569 Malignant neoplasm of unspecified ovary: Secondary | ICD-10-CM | POA: Diagnosis not present

## 2020-11-14 DIAGNOSIS — Z79899 Other long term (current) drug therapy: Secondary | ICD-10-CM | POA: Diagnosis not present

## 2020-11-28 DIAGNOSIS — Z5111 Encounter for antineoplastic chemotherapy: Secondary | ICD-10-CM | POA: Diagnosis not present

## 2020-11-28 DIAGNOSIS — C569 Malignant neoplasm of unspecified ovary: Secondary | ICD-10-CM | POA: Diagnosis not present

## 2020-12-12 DIAGNOSIS — Z5111 Encounter for antineoplastic chemotherapy: Secondary | ICD-10-CM | POA: Diagnosis not present

## 2020-12-12 DIAGNOSIS — E785 Hyperlipidemia, unspecified: Secondary | ICD-10-CM | POA: Diagnosis not present

## 2020-12-12 DIAGNOSIS — Z85828 Personal history of other malignant neoplasm of skin: Secondary | ICD-10-CM | POA: Diagnosis not present

## 2020-12-12 DIAGNOSIS — C569 Malignant neoplasm of unspecified ovary: Secondary | ICD-10-CM | POA: Diagnosis not present

## 2020-12-12 DIAGNOSIS — Z90710 Acquired absence of both cervix and uterus: Secondary | ICD-10-CM | POA: Diagnosis not present

## 2020-12-12 DIAGNOSIS — Z90722 Acquired absence of ovaries, bilateral: Secondary | ICD-10-CM | POA: Diagnosis not present

## 2020-12-12 DIAGNOSIS — Z79899 Other long term (current) drug therapy: Secondary | ICD-10-CM | POA: Diagnosis not present

## 2020-12-12 DIAGNOSIS — Z7982 Long term (current) use of aspirin: Secondary | ICD-10-CM | POA: Diagnosis not present

## 2020-12-25 DIAGNOSIS — E782 Mixed hyperlipidemia: Secondary | ICD-10-CM | POA: Diagnosis not present

## 2020-12-25 DIAGNOSIS — F324 Major depressive disorder, single episode, in partial remission: Secondary | ICD-10-CM | POA: Diagnosis not present

## 2020-12-25 DIAGNOSIS — Z23 Encounter for immunization: Secondary | ICD-10-CM | POA: Diagnosis not present

## 2020-12-25 DIAGNOSIS — F411 Generalized anxiety disorder: Secondary | ICD-10-CM | POA: Diagnosis not present

## 2020-12-25 DIAGNOSIS — K219 Gastro-esophageal reflux disease without esophagitis: Secondary | ICD-10-CM | POA: Diagnosis not present

## 2020-12-25 DIAGNOSIS — R011 Cardiac murmur, unspecified: Secondary | ICD-10-CM | POA: Diagnosis not present

## 2020-12-25 DIAGNOSIS — I10 Essential (primary) hypertension: Secondary | ICD-10-CM | POA: Diagnosis not present

## 2020-12-26 ENCOUNTER — Telehealth: Payer: Self-pay

## 2020-12-26 DIAGNOSIS — Z5111 Encounter for antineoplastic chemotherapy: Secondary | ICD-10-CM | POA: Diagnosis not present

## 2020-12-26 DIAGNOSIS — C569 Malignant neoplasm of unspecified ovary: Secondary | ICD-10-CM | POA: Diagnosis not present

## 2020-12-26 NOTE — Telephone Encounter (Signed)
Referral notes received from  [facility], Phone #: [phone], Fax #: [phone]   A copy of the notes have been placed in the scheduling box for check-out to pick-up and to enter referral. Original notes placed in file cabinet.

## 2021-01-09 DIAGNOSIS — Z5111 Encounter for antineoplastic chemotherapy: Secondary | ICD-10-CM | POA: Diagnosis not present

## 2021-01-09 DIAGNOSIS — C561 Malignant neoplasm of right ovary: Secondary | ICD-10-CM | POA: Diagnosis not present

## 2021-01-09 DIAGNOSIS — C569 Malignant neoplasm of unspecified ovary: Secondary | ICD-10-CM | POA: Diagnosis not present

## 2021-01-23 DIAGNOSIS — C569 Malignant neoplasm of unspecified ovary: Secondary | ICD-10-CM | POA: Diagnosis not present

## 2021-01-23 DIAGNOSIS — Z5111 Encounter for antineoplastic chemotherapy: Secondary | ICD-10-CM | POA: Diagnosis not present

## 2021-01-30 DIAGNOSIS — Z86010 Personal history of colonic polyps: Secondary | ICD-10-CM | POA: Diagnosis not present

## 2021-01-30 DIAGNOSIS — K219 Gastro-esophageal reflux disease without esophagitis: Secondary | ICD-10-CM | POA: Diagnosis not present

## 2021-01-30 DIAGNOSIS — K56609 Unspecified intestinal obstruction, unspecified as to partial versus complete obstruction: Secondary | ICD-10-CM | POA: Diagnosis not present

## 2021-02-03 DIAGNOSIS — I2510 Atherosclerotic heart disease of native coronary artery without angina pectoris: Secondary | ICD-10-CM | POA: Diagnosis not present

## 2021-02-03 DIAGNOSIS — R911 Solitary pulmonary nodule: Secondary | ICD-10-CM | POA: Diagnosis not present

## 2021-02-03 DIAGNOSIS — C569 Malignant neoplasm of unspecified ovary: Secondary | ICD-10-CM | POA: Diagnosis not present

## 2021-02-03 DIAGNOSIS — R590 Localized enlarged lymph nodes: Secondary | ICD-10-CM | POA: Diagnosis not present

## 2021-02-03 DIAGNOSIS — J8410 Pulmonary fibrosis, unspecified: Secondary | ICD-10-CM | POA: Diagnosis not present

## 2021-02-03 DIAGNOSIS — C561 Malignant neoplasm of right ovary: Secondary | ICD-10-CM | POA: Diagnosis not present

## 2021-02-05 DIAGNOSIS — C569 Malignant neoplasm of unspecified ovary: Secondary | ICD-10-CM | POA: Diagnosis not present

## 2021-02-13 DIAGNOSIS — C569 Malignant neoplasm of unspecified ovary: Secondary | ICD-10-CM | POA: Diagnosis not present

## 2021-02-14 DIAGNOSIS — C569 Malignant neoplasm of unspecified ovary: Secondary | ICD-10-CM | POA: Diagnosis not present

## 2021-02-14 DIAGNOSIS — Z5111 Encounter for antineoplastic chemotherapy: Secondary | ICD-10-CM | POA: Diagnosis not present

## 2021-02-14 DIAGNOSIS — D611 Drug-induced aplastic anemia: Secondary | ICD-10-CM | POA: Diagnosis not present

## 2021-02-18 DIAGNOSIS — I890 Lymphedema, not elsewhere classified: Secondary | ICD-10-CM | POA: Diagnosis not present

## 2021-02-27 DIAGNOSIS — C569 Malignant neoplasm of unspecified ovary: Secondary | ICD-10-CM | POA: Diagnosis not present

## 2021-02-27 DIAGNOSIS — Z452 Encounter for adjustment and management of vascular access device: Secondary | ICD-10-CM | POA: Diagnosis not present

## 2021-02-27 DIAGNOSIS — Z5111 Encounter for antineoplastic chemotherapy: Secondary | ICD-10-CM | POA: Diagnosis not present

## 2021-03-13 ENCOUNTER — Encounter: Payer: Medicare Other | Admitting: Cardiology

## 2021-03-13 ENCOUNTER — Encounter: Payer: Self-pay | Admitting: Cardiology

## 2021-03-13 DIAGNOSIS — Z5111 Encounter for antineoplastic chemotherapy: Secondary | ICD-10-CM | POA: Diagnosis not present

## 2021-03-13 DIAGNOSIS — C563 Malignant neoplasm of bilateral ovaries: Secondary | ICD-10-CM | POA: Diagnosis not present

## 2021-03-13 DIAGNOSIS — K5900 Constipation, unspecified: Secondary | ICD-10-CM | POA: Diagnosis not present

## 2021-03-13 DIAGNOSIS — C569 Malignant neoplasm of unspecified ovary: Secondary | ICD-10-CM | POA: Diagnosis not present

## 2021-03-13 NOTE — Progress Notes (Signed)
This encounter was created in error - please disregard.

## 2021-03-27 DIAGNOSIS — Z5111 Encounter for antineoplastic chemotherapy: Secondary | ICD-10-CM | POA: Diagnosis not present

## 2021-03-27 DIAGNOSIS — C569 Malignant neoplasm of unspecified ovary: Secondary | ICD-10-CM | POA: Diagnosis not present

## 2021-04-07 DIAGNOSIS — C563 Malignant neoplasm of bilateral ovaries: Secondary | ICD-10-CM | POA: Diagnosis not present

## 2021-04-07 DIAGNOSIS — J8410 Pulmonary fibrosis, unspecified: Secondary | ICD-10-CM | POA: Diagnosis not present

## 2021-04-07 DIAGNOSIS — I2510 Atherosclerotic heart disease of native coronary artery without angina pectoris: Secondary | ICD-10-CM | POA: Diagnosis not present

## 2021-04-07 DIAGNOSIS — I10 Essential (primary) hypertension: Secondary | ICD-10-CM | POA: Diagnosis not present

## 2021-04-07 DIAGNOSIS — E782 Mixed hyperlipidemia: Secondary | ICD-10-CM | POA: Diagnosis not present

## 2021-04-07 DIAGNOSIS — R011 Cardiac murmur, unspecified: Secondary | ICD-10-CM | POA: Diagnosis not present

## 2021-04-07 DIAGNOSIS — F331 Major depressive disorder, recurrent, moderate: Secondary | ICD-10-CM | POA: Diagnosis not present

## 2021-04-10 DIAGNOSIS — Z5111 Encounter for antineoplastic chemotherapy: Secondary | ICD-10-CM | POA: Diagnosis not present

## 2021-04-10 DIAGNOSIS — C569 Malignant neoplasm of unspecified ovary: Secondary | ICD-10-CM | POA: Diagnosis not present

## 2021-04-16 ENCOUNTER — Ambulatory Visit: Payer: Medicare Other | Admitting: Cardiology

## 2021-04-21 DIAGNOSIS — C569 Malignant neoplasm of unspecified ovary: Secondary | ICD-10-CM | POA: Diagnosis not present

## 2021-04-23 DIAGNOSIS — K2950 Unspecified chronic gastritis without bleeding: Secondary | ICD-10-CM | POA: Diagnosis not present

## 2021-04-23 DIAGNOSIS — Z1211 Encounter for screening for malignant neoplasm of colon: Secondary | ICD-10-CM | POA: Diagnosis not present

## 2021-04-23 DIAGNOSIS — D123 Benign neoplasm of transverse colon: Secondary | ICD-10-CM | POA: Diagnosis not present

## 2021-04-23 DIAGNOSIS — E7800 Pure hypercholesterolemia, unspecified: Secondary | ICD-10-CM | POA: Diagnosis not present

## 2021-04-23 DIAGNOSIS — K219 Gastro-esophageal reflux disease without esophagitis: Secondary | ICD-10-CM | POA: Diagnosis not present

## 2021-04-23 DIAGNOSIS — K3189 Other diseases of stomach and duodenum: Secondary | ICD-10-CM | POA: Diagnosis not present

## 2021-04-23 DIAGNOSIS — K449 Diaphragmatic hernia without obstruction or gangrene: Secondary | ICD-10-CM | POA: Diagnosis not present

## 2021-04-23 DIAGNOSIS — Z86010 Personal history of colonic polyps: Secondary | ICD-10-CM | POA: Diagnosis not present

## 2021-04-23 DIAGNOSIS — D122 Benign neoplasm of ascending colon: Secondary | ICD-10-CM | POA: Diagnosis not present

## 2021-04-25 DIAGNOSIS — Z23 Encounter for immunization: Secondary | ICD-10-CM | POA: Diagnosis not present

## 2021-05-06 DIAGNOSIS — E782 Mixed hyperlipidemia: Secondary | ICD-10-CM | POA: Diagnosis not present

## 2021-05-06 DIAGNOSIS — I890 Lymphedema, not elsewhere classified: Secondary | ICD-10-CM | POA: Diagnosis not present

## 2021-05-06 DIAGNOSIS — K219 Gastro-esophageal reflux disease without esophagitis: Secondary | ICD-10-CM | POA: Diagnosis not present

## 2021-05-06 DIAGNOSIS — C569 Malignant neoplasm of unspecified ovary: Secondary | ICD-10-CM | POA: Diagnosis not present

## 2021-05-06 DIAGNOSIS — R011 Cardiac murmur, unspecified: Secondary | ICD-10-CM | POA: Diagnosis not present

## 2021-05-06 DIAGNOSIS — F324 Major depressive disorder, single episode, in partial remission: Secondary | ICD-10-CM | POA: Diagnosis not present

## 2021-05-06 DIAGNOSIS — F411 Generalized anxiety disorder: Secondary | ICD-10-CM | POA: Diagnosis not present

## 2021-05-06 DIAGNOSIS — I10 Essential (primary) hypertension: Secondary | ICD-10-CM | POA: Diagnosis not present

## 2021-05-13 DIAGNOSIS — C569 Malignant neoplasm of unspecified ovary: Secondary | ICD-10-CM | POA: Diagnosis not present

## 2021-05-15 DIAGNOSIS — Z5111 Encounter for antineoplastic chemotherapy: Secondary | ICD-10-CM | POA: Diagnosis not present

## 2021-05-15 DIAGNOSIS — C569 Malignant neoplasm of unspecified ovary: Secondary | ICD-10-CM | POA: Diagnosis not present

## 2021-05-20 DIAGNOSIS — R410 Disorientation, unspecified: Secondary | ICD-10-CM | POA: Diagnosis not present

## 2021-05-20 DIAGNOSIS — Z880 Allergy status to penicillin: Secondary | ICD-10-CM | POA: Diagnosis not present

## 2021-05-20 DIAGNOSIS — I6782 Cerebral ischemia: Secondary | ICD-10-CM | POA: Diagnosis not present

## 2021-05-20 DIAGNOSIS — Z888 Allergy status to other drugs, medicaments and biological substances status: Secondary | ICD-10-CM | POA: Diagnosis not present

## 2021-05-20 DIAGNOSIS — Z743 Need for continuous supervision: Secondary | ICD-10-CM | POA: Diagnosis not present

## 2021-05-20 DIAGNOSIS — F32A Depression, unspecified: Secondary | ICD-10-CM | POA: Diagnosis not present

## 2021-05-20 DIAGNOSIS — J449 Chronic obstructive pulmonary disease, unspecified: Secondary | ICD-10-CM | POA: Diagnosis not present

## 2021-05-20 DIAGNOSIS — R4182 Altered mental status, unspecified: Secondary | ICD-10-CM | POA: Diagnosis not present

## 2021-05-20 DIAGNOSIS — R531 Weakness: Secondary | ICD-10-CM | POA: Diagnosis not present

## 2021-05-20 DIAGNOSIS — Z79899 Other long term (current) drug therapy: Secondary | ICD-10-CM | POA: Diagnosis not present

## 2021-05-20 DIAGNOSIS — K219 Gastro-esophageal reflux disease without esophagitis: Secondary | ICD-10-CM | POA: Diagnosis not present

## 2021-05-20 DIAGNOSIS — Z87891 Personal history of nicotine dependence: Secondary | ICD-10-CM | POA: Diagnosis not present

## 2021-05-20 DIAGNOSIS — E785 Hyperlipidemia, unspecified: Secondary | ICD-10-CM | POA: Diagnosis not present

## 2021-05-20 DIAGNOSIS — Z881 Allergy status to other antibiotic agents status: Secondary | ICD-10-CM | POA: Diagnosis not present

## 2021-05-20 DIAGNOSIS — N390 Urinary tract infection, site not specified: Secondary | ICD-10-CM | POA: Diagnosis not present

## 2021-05-20 DIAGNOSIS — R442 Other hallucinations: Secondary | ICD-10-CM | POA: Diagnosis not present

## 2021-05-20 DIAGNOSIS — Z882 Allergy status to sulfonamides status: Secondary | ICD-10-CM | POA: Diagnosis not present

## 2021-05-20 DIAGNOSIS — I10 Essential (primary) hypertension: Secondary | ICD-10-CM | POA: Diagnosis not present

## 2021-05-20 DIAGNOSIS — R443 Hallucinations, unspecified: Secondary | ICD-10-CM | POA: Diagnosis not present

## 2021-05-20 DIAGNOSIS — Z885 Allergy status to narcotic agent status: Secondary | ICD-10-CM | POA: Diagnosis not present

## 2021-05-20 DIAGNOSIS — R441 Visual hallucinations: Secondary | ICD-10-CM | POA: Diagnosis not present

## 2021-05-23 ENCOUNTER — Other Ambulatory Visit: Payer: Self-pay

## 2021-05-23 ENCOUNTER — Ambulatory Visit: Payer: Medicare Other | Admitting: Cardiology

## 2021-05-23 DIAGNOSIS — E785 Hyperlipidemia, unspecified: Secondary | ICD-10-CM | POA: Diagnosis not present

## 2021-05-23 DIAGNOSIS — R0789 Other chest pain: Secondary | ICD-10-CM | POA: Diagnosis not present

## 2021-05-23 DIAGNOSIS — K5989 Other specified functional intestinal disorders: Secondary | ICD-10-CM | POA: Diagnosis not present

## 2021-05-23 DIAGNOSIS — Z882 Allergy status to sulfonamides status: Secondary | ICD-10-CM | POA: Diagnosis not present

## 2021-05-23 DIAGNOSIS — R079 Chest pain, unspecified: Secondary | ICD-10-CM | POA: Diagnosis not present

## 2021-05-23 DIAGNOSIS — K219 Gastro-esophageal reflux disease without esophagitis: Secondary | ICD-10-CM | POA: Diagnosis not present

## 2021-05-23 DIAGNOSIS — R9431 Abnormal electrocardiogram [ECG] [EKG]: Secondary | ICD-10-CM | POA: Diagnosis not present

## 2021-05-23 DIAGNOSIS — Z87891 Personal history of nicotine dependence: Secondary | ICD-10-CM | POA: Diagnosis not present

## 2021-05-23 DIAGNOSIS — J449 Chronic obstructive pulmonary disease, unspecified: Secondary | ICD-10-CM | POA: Diagnosis not present

## 2021-05-23 DIAGNOSIS — Z888 Allergy status to other drugs, medicaments and biological substances status: Secondary | ICD-10-CM | POA: Diagnosis not present

## 2021-05-23 DIAGNOSIS — I10 Essential (primary) hypertension: Secondary | ICD-10-CM | POA: Diagnosis not present

## 2021-05-23 DIAGNOSIS — Z885 Allergy status to narcotic agent status: Secondary | ICD-10-CM | POA: Diagnosis not present

## 2021-05-23 DIAGNOSIS — R441 Visual hallucinations: Secondary | ICD-10-CM | POA: Diagnosis not present

## 2021-05-23 DIAGNOSIS — Z4682 Encounter for fitting and adjustment of non-vascular catheter: Secondary | ICD-10-CM | POA: Diagnosis not present

## 2021-05-23 DIAGNOSIS — Z79899 Other long term (current) drug therapy: Secondary | ICD-10-CM | POA: Diagnosis not present

## 2021-05-23 DIAGNOSIS — E876 Hypokalemia: Secondary | ICD-10-CM | POA: Diagnosis not present

## 2021-05-23 DIAGNOSIS — K449 Diaphragmatic hernia without obstruction or gangrene: Secondary | ICD-10-CM | POA: Diagnosis not present

## 2021-05-23 DIAGNOSIS — K3189 Other diseases of stomach and duodenum: Secondary | ICD-10-CM | POA: Diagnosis not present

## 2021-05-23 DIAGNOSIS — R140 Abdominal distension (gaseous): Secondary | ICD-10-CM | POA: Diagnosis not present

## 2021-05-28 ENCOUNTER — Other Ambulatory Visit: Payer: Self-pay | Admitting: Obstetrics and Gynecology

## 2021-05-28 DIAGNOSIS — Z1231 Encounter for screening mammogram for malignant neoplasm of breast: Secondary | ICD-10-CM

## 2021-05-29 DIAGNOSIS — C569 Malignant neoplasm of unspecified ovary: Secondary | ICD-10-CM | POA: Diagnosis not present

## 2021-05-29 DIAGNOSIS — Z5111 Encounter for antineoplastic chemotherapy: Secondary | ICD-10-CM | POA: Diagnosis not present

## 2021-05-30 DIAGNOSIS — F324 Major depressive disorder, single episode, in partial remission: Secondary | ICD-10-CM | POA: Diagnosis not present

## 2021-05-30 DIAGNOSIS — F411 Generalized anxiety disorder: Secondary | ICD-10-CM | POA: Diagnosis not present

## 2021-05-30 DIAGNOSIS — I10 Essential (primary) hypertension: Secondary | ICD-10-CM | POA: Diagnosis not present

## 2021-05-30 DIAGNOSIS — R413 Other amnesia: Secondary | ICD-10-CM | POA: Diagnosis not present

## 2021-05-30 DIAGNOSIS — C569 Malignant neoplasm of unspecified ovary: Secondary | ICD-10-CM | POA: Diagnosis not present

## 2021-05-30 DIAGNOSIS — N182 Chronic kidney disease, stage 2 (mild): Secondary | ICD-10-CM | POA: Diagnosis not present

## 2021-06-09 DIAGNOSIS — F4320 Adjustment disorder, unspecified: Secondary | ICD-10-CM | POA: Diagnosis not present

## 2021-06-11 ENCOUNTER — Ambulatory Visit: Payer: Medicare Other

## 2021-06-12 DIAGNOSIS — R2241 Localized swelling, mass and lump, right lower limb: Secondary | ICD-10-CM | POA: Diagnosis not present

## 2021-06-12 DIAGNOSIS — C569 Malignant neoplasm of unspecified ovary: Secondary | ICD-10-CM | POA: Diagnosis not present

## 2021-06-26 DIAGNOSIS — C569 Malignant neoplasm of unspecified ovary: Secondary | ICD-10-CM | POA: Diagnosis not present

## 2021-07-07 DIAGNOSIS — Z880 Allergy status to penicillin: Secondary | ICD-10-CM | POA: Diagnosis not present

## 2021-07-07 DIAGNOSIS — I10 Essential (primary) hypertension: Secondary | ICD-10-CM | POA: Diagnosis not present

## 2021-07-07 DIAGNOSIS — Z85828 Personal history of other malignant neoplasm of skin: Secondary | ICD-10-CM | POA: Diagnosis not present

## 2021-07-07 DIAGNOSIS — Z881 Allergy status to other antibiotic agents status: Secondary | ICD-10-CM | POA: Diagnosis not present

## 2021-07-07 DIAGNOSIS — Z888 Allergy status to other drugs, medicaments and biological substances status: Secondary | ICD-10-CM | POA: Diagnosis not present

## 2021-07-07 DIAGNOSIS — K219 Gastro-esophageal reflux disease without esophagitis: Secondary | ICD-10-CM | POA: Diagnosis not present

## 2021-07-07 DIAGNOSIS — Z882 Allergy status to sulfonamides status: Secondary | ICD-10-CM | POA: Diagnosis not present

## 2021-07-07 DIAGNOSIS — R1909 Other intra-abdominal and pelvic swelling, mass and lump: Secondary | ICD-10-CM | POA: Diagnosis not present

## 2021-07-07 DIAGNOSIS — I341 Nonrheumatic mitral (valve) prolapse: Secondary | ICD-10-CM | POA: Diagnosis not present

## 2021-07-07 DIAGNOSIS — Z79899 Other long term (current) drug therapy: Secondary | ICD-10-CM | POA: Diagnosis not present

## 2021-07-07 DIAGNOSIS — E785 Hyperlipidemia, unspecified: Secondary | ICD-10-CM | POA: Diagnosis not present

## 2021-07-07 DIAGNOSIS — C569 Malignant neoplasm of unspecified ovary: Secondary | ICD-10-CM | POA: Diagnosis not present

## 2021-07-07 DIAGNOSIS — F419 Anxiety disorder, unspecified: Secondary | ICD-10-CM | POA: Diagnosis not present

## 2021-07-07 DIAGNOSIS — Z885 Allergy status to narcotic agent status: Secondary | ICD-10-CM | POA: Diagnosis not present

## 2021-07-07 DIAGNOSIS — F32A Depression, unspecified: Secondary | ICD-10-CM | POA: Diagnosis not present

## 2021-07-07 DIAGNOSIS — R1900 Intra-abdominal and pelvic swelling, mass and lump, unspecified site: Secondary | ICD-10-CM | POA: Diagnosis not present

## 2021-07-07 DIAGNOSIS — J449 Chronic obstructive pulmonary disease, unspecified: Secondary | ICD-10-CM | POA: Diagnosis not present

## 2021-07-10 DIAGNOSIS — C569 Malignant neoplasm of unspecified ovary: Secondary | ICD-10-CM | POA: Diagnosis not present

## 2021-07-10 DIAGNOSIS — C563 Malignant neoplasm of bilateral ovaries: Secondary | ICD-10-CM | POA: Diagnosis not present

## 2021-07-21 DIAGNOSIS — C562 Malignant neoplasm of left ovary: Secondary | ICD-10-CM | POA: Diagnosis not present

## 2021-07-21 DIAGNOSIS — C561 Malignant neoplasm of right ovary: Secondary | ICD-10-CM | POA: Diagnosis not present

## 2021-08-12 ENCOUNTER — Ambulatory Visit: Payer: Medicare Other

## 2021-08-14 DIAGNOSIS — R011 Cardiac murmur, unspecified: Secondary | ICD-10-CM | POA: Diagnosis not present

## 2021-08-14 DIAGNOSIS — C569 Malignant neoplasm of unspecified ovary: Secondary | ICD-10-CM | POA: Diagnosis not present

## 2021-08-14 DIAGNOSIS — D649 Anemia, unspecified: Secondary | ICD-10-CM | POA: Diagnosis not present

## 2021-08-14 DIAGNOSIS — N182 Chronic kidney disease, stage 2 (mild): Secondary | ICD-10-CM | POA: Diagnosis not present

## 2021-08-14 DIAGNOSIS — I10 Essential (primary) hypertension: Secondary | ICD-10-CM | POA: Diagnosis not present

## 2021-08-14 DIAGNOSIS — F324 Major depressive disorder, single episode, in partial remission: Secondary | ICD-10-CM | POA: Diagnosis not present

## 2021-08-14 DIAGNOSIS — F411 Generalized anxiety disorder: Secondary | ICD-10-CM | POA: Diagnosis not present

## 2021-08-22 DIAGNOSIS — K5900 Constipation, unspecified: Secondary | ICD-10-CM | POA: Diagnosis not present

## 2021-08-22 DIAGNOSIS — K449 Diaphragmatic hernia without obstruction or gangrene: Secondary | ICD-10-CM | POA: Diagnosis not present

## 2021-08-22 DIAGNOSIS — C563 Malignant neoplasm of bilateral ovaries: Secondary | ICD-10-CM | POA: Diagnosis not present

## 2021-08-28 DIAGNOSIS — R2241 Localized swelling, mass and lump, right lower limb: Secondary | ICD-10-CM | POA: Diagnosis not present

## 2021-08-28 DIAGNOSIS — C563 Malignant neoplasm of bilateral ovaries: Secondary | ICD-10-CM | POA: Diagnosis not present

## 2021-08-29 DIAGNOSIS — R2241 Localized swelling, mass and lump, right lower limb: Secondary | ICD-10-CM | POA: Diagnosis not present

## 2021-08-29 DIAGNOSIS — M7989 Other specified soft tissue disorders: Secondary | ICD-10-CM | POA: Diagnosis not present

## 2021-09-04 DIAGNOSIS — I10 Essential (primary) hypertension: Secondary | ICD-10-CM | POA: Diagnosis not present

## 2021-09-04 DIAGNOSIS — C569 Malignant neoplasm of unspecified ovary: Secondary | ICD-10-CM | POA: Diagnosis not present

## 2021-09-04 DIAGNOSIS — S2090XA Unspecified superficial injury of unspecified parts of thorax, initial encounter: Secondary | ICD-10-CM | POA: Diagnosis not present

## 2021-09-04 DIAGNOSIS — Z880 Allergy status to penicillin: Secondary | ICD-10-CM | POA: Diagnosis not present

## 2021-09-04 DIAGNOSIS — Z881 Allergy status to other antibiotic agents status: Secondary | ICD-10-CM | POA: Diagnosis not present

## 2021-09-04 DIAGNOSIS — Z882 Allergy status to sulfonamides status: Secondary | ICD-10-CM | POA: Diagnosis not present

## 2021-09-04 DIAGNOSIS — Z5111 Encounter for antineoplastic chemotherapy: Secondary | ICD-10-CM | POA: Diagnosis not present

## 2021-09-04 DIAGNOSIS — Z885 Allergy status to narcotic agent status: Secondary | ICD-10-CM | POA: Diagnosis not present

## 2021-09-04 DIAGNOSIS — J449 Chronic obstructive pulmonary disease, unspecified: Secondary | ICD-10-CM | POA: Diagnosis not present

## 2021-09-04 DIAGNOSIS — K219 Gastro-esophageal reflux disease without esophagitis: Secondary | ICD-10-CM | POA: Diagnosis not present

## 2021-09-04 DIAGNOSIS — Z79899 Other long term (current) drug therapy: Secondary | ICD-10-CM | POA: Diagnosis not present

## 2021-09-04 DIAGNOSIS — S20211A Contusion of right front wall of thorax, initial encounter: Secondary | ICD-10-CM | POA: Diagnosis not present

## 2021-09-04 DIAGNOSIS — Z87891 Personal history of nicotine dependence: Secondary | ICD-10-CM | POA: Diagnosis not present

## 2021-09-04 DIAGNOSIS — S20219A Contusion of unspecified front wall of thorax, initial encounter: Secondary | ICD-10-CM | POA: Diagnosis not present

## 2021-09-04 DIAGNOSIS — Z888 Allergy status to other drugs, medicaments and biological substances status: Secondary | ICD-10-CM | POA: Diagnosis not present

## 2021-09-04 DIAGNOSIS — Z8543 Personal history of malignant neoplasm of ovary: Secondary | ICD-10-CM | POA: Diagnosis not present

## 2021-09-08 ENCOUNTER — Telehealth: Payer: Self-pay | Admitting: Cardiology

## 2021-09-08 NOTE — Telephone Encounter (Signed)
New Message: ? ? ? ? ?Patient would like to switch from Dr Radford Pax to Dr Irish Lack please.. Is this alright with you? ?

## 2021-09-11 DIAGNOSIS — C569 Malignant neoplasm of unspecified ovary: Secondary | ICD-10-CM | POA: Diagnosis not present

## 2021-09-11 DIAGNOSIS — Z5111 Encounter for antineoplastic chemotherapy: Secondary | ICD-10-CM | POA: Diagnosis not present

## 2021-09-16 DIAGNOSIS — K56609 Unspecified intestinal obstruction, unspecified as to partial versus complete obstruction: Secondary | ICD-10-CM | POA: Diagnosis not present

## 2021-09-16 DIAGNOSIS — R198 Other specified symptoms and signs involving the digestive system and abdomen: Secondary | ICD-10-CM | POA: Diagnosis not present

## 2021-09-16 DIAGNOSIS — K5900 Constipation, unspecified: Secondary | ICD-10-CM | POA: Diagnosis not present

## 2021-09-16 DIAGNOSIS — K219 Gastro-esophageal reflux disease without esophagitis: Secondary | ICD-10-CM | POA: Diagnosis not present

## 2021-09-18 DIAGNOSIS — C569 Malignant neoplasm of unspecified ovary: Secondary | ICD-10-CM | POA: Diagnosis not present

## 2021-09-18 DIAGNOSIS — Z5111 Encounter for antineoplastic chemotherapy: Secondary | ICD-10-CM | POA: Diagnosis not present

## 2021-10-02 DIAGNOSIS — K5900 Constipation, unspecified: Secondary | ICD-10-CM | POA: Diagnosis not present

## 2021-10-02 DIAGNOSIS — Z79899 Other long term (current) drug therapy: Secondary | ICD-10-CM | POA: Diagnosis not present

## 2021-10-02 DIAGNOSIS — C569 Malignant neoplasm of unspecified ovary: Secondary | ICD-10-CM | POA: Diagnosis not present

## 2021-10-02 DIAGNOSIS — Z5111 Encounter for antineoplastic chemotherapy: Secondary | ICD-10-CM | POA: Diagnosis not present

## 2021-10-09 DIAGNOSIS — Z5111 Encounter for antineoplastic chemotherapy: Secondary | ICD-10-CM | POA: Diagnosis not present

## 2021-10-09 DIAGNOSIS — C569 Malignant neoplasm of unspecified ovary: Secondary | ICD-10-CM | POA: Diagnosis not present

## 2021-10-16 DIAGNOSIS — R059 Cough, unspecified: Secondary | ICD-10-CM | POA: Diagnosis not present

## 2021-10-16 DIAGNOSIS — C569 Malignant neoplasm of unspecified ovary: Secondary | ICD-10-CM | POA: Diagnosis not present

## 2021-10-16 DIAGNOSIS — I700 Atherosclerosis of aorta: Secondary | ICD-10-CM | POA: Diagnosis not present

## 2021-10-16 DIAGNOSIS — K449 Diaphragmatic hernia without obstruction or gangrene: Secondary | ICD-10-CM | POA: Diagnosis not present

## 2021-10-16 DIAGNOSIS — E876 Hypokalemia: Secondary | ICD-10-CM | POA: Diagnosis not present

## 2021-10-23 DIAGNOSIS — C569 Malignant neoplasm of unspecified ovary: Secondary | ICD-10-CM | POA: Diagnosis not present

## 2021-10-27 DIAGNOSIS — K449 Diaphragmatic hernia without obstruction or gangrene: Secondary | ICD-10-CM | POA: Diagnosis not present

## 2021-10-27 DIAGNOSIS — R9082 White matter disease, unspecified: Secondary | ICD-10-CM | POA: Diagnosis not present

## 2021-10-27 DIAGNOSIS — Z79899 Other long term (current) drug therapy: Secondary | ICD-10-CM | POA: Diagnosis not present

## 2021-10-27 DIAGNOSIS — I10 Essential (primary) hypertension: Secondary | ICD-10-CM | POA: Diagnosis not present

## 2021-10-27 DIAGNOSIS — R0602 Shortness of breath: Secondary | ICD-10-CM | POA: Diagnosis not present

## 2021-10-27 DIAGNOSIS — C569 Malignant neoplasm of unspecified ovary: Secondary | ICD-10-CM | POA: Diagnosis not present

## 2021-10-27 DIAGNOSIS — R9431 Abnormal electrocardiogram [ECG] [EKG]: Secondary | ICD-10-CM | POA: Diagnosis not present

## 2021-10-27 DIAGNOSIS — Z452 Encounter for adjustment and management of vascular access device: Secondary | ICD-10-CM | POA: Diagnosis not present

## 2021-10-27 DIAGNOSIS — E876 Hypokalemia: Secondary | ICD-10-CM | POA: Diagnosis not present

## 2021-10-27 DIAGNOSIS — R519 Headache, unspecified: Secondary | ICD-10-CM | POA: Diagnosis not present

## 2021-10-30 DIAGNOSIS — Z79899 Other long term (current) drug therapy: Secondary | ICD-10-CM | POA: Diagnosis not present

## 2021-10-30 DIAGNOSIS — C569 Malignant neoplasm of unspecified ovary: Secondary | ICD-10-CM | POA: Diagnosis not present

## 2021-10-30 DIAGNOSIS — N289 Disorder of kidney and ureter, unspecified: Secondary | ICD-10-CM | POA: Diagnosis not present

## 2021-10-30 DIAGNOSIS — Z5111 Encounter for antineoplastic chemotherapy: Secondary | ICD-10-CM | POA: Diagnosis not present

## 2021-10-30 DIAGNOSIS — F419 Anxiety disorder, unspecified: Secondary | ICD-10-CM | POA: Diagnosis not present

## 2021-10-30 DIAGNOSIS — E785 Hyperlipidemia, unspecified: Secondary | ICD-10-CM | POA: Diagnosis not present

## 2021-10-30 DIAGNOSIS — K5900 Constipation, unspecified: Secondary | ICD-10-CM | POA: Diagnosis not present

## 2021-11-05 DIAGNOSIS — R7989 Other specified abnormal findings of blood chemistry: Secondary | ICD-10-CM | POA: Diagnosis not present

## 2021-11-05 DIAGNOSIS — J984 Other disorders of lung: Secondary | ICD-10-CM | POA: Diagnosis not present

## 2021-11-05 DIAGNOSIS — F05 Delirium due to known physiological condition: Secondary | ICD-10-CM | POA: Diagnosis not present

## 2021-11-05 DIAGNOSIS — Z880 Allergy status to penicillin: Secondary | ICD-10-CM | POA: Diagnosis not present

## 2021-11-05 DIAGNOSIS — Z885 Allergy status to narcotic agent status: Secondary | ICD-10-CM | POA: Diagnosis not present

## 2021-11-05 DIAGNOSIS — D329 Benign neoplasm of meninges, unspecified: Secondary | ICD-10-CM | POA: Diagnosis not present

## 2021-11-05 DIAGNOSIS — C569 Malignant neoplasm of unspecified ovary: Secondary | ICD-10-CM | POA: Diagnosis not present

## 2021-11-05 DIAGNOSIS — R079 Chest pain, unspecified: Secondary | ICD-10-CM | POA: Diagnosis not present

## 2021-11-05 DIAGNOSIS — Z881 Allergy status to other antibiotic agents status: Secondary | ICD-10-CM | POA: Diagnosis not present

## 2021-11-05 DIAGNOSIS — Z87891 Personal history of nicotine dependence: Secondary | ICD-10-CM | POA: Diagnosis not present

## 2021-11-05 DIAGNOSIS — J929 Pleural plaque without asbestos: Secondary | ICD-10-CM | POA: Diagnosis not present

## 2021-11-05 DIAGNOSIS — K449 Diaphragmatic hernia without obstruction or gangrene: Secondary | ICD-10-CM | POA: Diagnosis not present

## 2021-11-05 DIAGNOSIS — R0600 Dyspnea, unspecified: Secondary | ICD-10-CM | POA: Diagnosis not present

## 2021-11-05 DIAGNOSIS — R0602 Shortness of breath: Secondary | ICD-10-CM | POA: Diagnosis not present

## 2021-11-05 DIAGNOSIS — J9811 Atelectasis: Secondary | ICD-10-CM | POA: Diagnosis not present

## 2021-11-05 DIAGNOSIS — R410 Disorientation, unspecified: Secondary | ICD-10-CM | POA: Diagnosis not present

## 2021-11-05 DIAGNOSIS — R9082 White matter disease, unspecified: Secondary | ICD-10-CM | POA: Diagnosis not present

## 2021-11-05 DIAGNOSIS — Z882 Allergy status to sulfonamides status: Secondary | ICD-10-CM | POA: Diagnosis not present

## 2021-11-13 DIAGNOSIS — C569 Malignant neoplasm of unspecified ovary: Secondary | ICD-10-CM | POA: Diagnosis not present

## 2021-11-21 DIAGNOSIS — Z880 Allergy status to penicillin: Secondary | ICD-10-CM | POA: Diagnosis not present

## 2021-11-21 DIAGNOSIS — S199XXA Unspecified injury of neck, initial encounter: Secondary | ICD-10-CM | POA: Diagnosis not present

## 2021-11-21 DIAGNOSIS — R410 Disorientation, unspecified: Secondary | ICD-10-CM | POA: Diagnosis not present

## 2021-11-21 DIAGNOSIS — R9431 Abnormal electrocardiogram [ECG] [EKG]: Secondary | ICD-10-CM | POA: Diagnosis not present

## 2021-11-21 DIAGNOSIS — S0990XA Unspecified injury of head, initial encounter: Secondary | ICD-10-CM | POA: Diagnosis not present

## 2021-11-21 DIAGNOSIS — Z882 Allergy status to sulfonamides status: Secondary | ICD-10-CM | POA: Diagnosis not present

## 2021-11-21 DIAGNOSIS — S0001XA Abrasion of scalp, initial encounter: Secondary | ICD-10-CM | POA: Diagnosis not present

## 2021-11-21 DIAGNOSIS — Z87891 Personal history of nicotine dependence: Secondary | ICD-10-CM | POA: Diagnosis not present

## 2021-11-21 DIAGNOSIS — Z885 Allergy status to narcotic agent status: Secondary | ICD-10-CM | POA: Diagnosis not present

## 2021-11-21 DIAGNOSIS — I10 Essential (primary) hypertension: Secondary | ICD-10-CM | POA: Diagnosis not present

## 2021-11-21 DIAGNOSIS — J449 Chronic obstructive pulmonary disease, unspecified: Secondary | ICD-10-CM | POA: Diagnosis not present

## 2021-11-21 DIAGNOSIS — S0003XA Contusion of scalp, initial encounter: Secondary | ICD-10-CM | POA: Diagnosis not present

## 2021-11-21 DIAGNOSIS — Z888 Allergy status to other drugs, medicaments and biological substances status: Secondary | ICD-10-CM | POA: Diagnosis not present

## 2021-11-21 DIAGNOSIS — Z8543 Personal history of malignant neoplasm of ovary: Secondary | ICD-10-CM | POA: Diagnosis not present

## 2021-12-01 DIAGNOSIS — I10 Essential (primary) hypertension: Secondary | ICD-10-CM | POA: Diagnosis not present

## 2021-12-01 DIAGNOSIS — S22009A Unspecified fracture of unspecified thoracic vertebra, initial encounter for closed fracture: Secondary | ICD-10-CM | POA: Diagnosis not present

## 2021-12-01 DIAGNOSIS — Z79899 Other long term (current) drug therapy: Secondary | ICD-10-CM | POA: Diagnosis not present

## 2021-12-01 DIAGNOSIS — R8281 Pyuria: Secondary | ICD-10-CM | POA: Diagnosis not present

## 2021-12-01 DIAGNOSIS — C569 Malignant neoplasm of unspecified ovary: Secondary | ICD-10-CM | POA: Diagnosis not present

## 2021-12-01 DIAGNOSIS — S0101XA Laceration without foreign body of scalp, initial encounter: Secondary | ICD-10-CM | POA: Diagnosis not present

## 2021-12-01 DIAGNOSIS — Z87891 Personal history of nicotine dependence: Secondary | ICD-10-CM | POA: Diagnosis not present

## 2021-12-01 DIAGNOSIS — R2689 Other abnormalities of gait and mobility: Secondary | ICD-10-CM | POA: Diagnosis not present

## 2021-12-01 DIAGNOSIS — S32030A Wedge compression fracture of third lumbar vertebra, initial encounter for closed fracture: Secondary | ICD-10-CM | POA: Diagnosis not present

## 2021-12-01 DIAGNOSIS — Z882 Allergy status to sulfonamides status: Secondary | ICD-10-CM | POA: Diagnosis not present

## 2021-12-01 DIAGNOSIS — R296 Repeated falls: Secondary | ICD-10-CM | POA: Diagnosis not present

## 2021-12-01 DIAGNOSIS — R7303 Prediabetes: Secondary | ICD-10-CM | POA: Diagnosis not present

## 2021-12-01 DIAGNOSIS — Z880 Allergy status to penicillin: Secondary | ICD-10-CM | POA: Diagnosis not present

## 2021-12-01 DIAGNOSIS — Z885 Allergy status to narcotic agent status: Secondary | ICD-10-CM | POA: Diagnosis not present

## 2021-12-01 DIAGNOSIS — J449 Chronic obstructive pulmonary disease, unspecified: Secondary | ICD-10-CM | POA: Diagnosis not present

## 2021-12-01 DIAGNOSIS — F0390 Unspecified dementia without behavioral disturbance: Secondary | ICD-10-CM | POA: Diagnosis not present

## 2021-12-01 DIAGNOSIS — F331 Major depressive disorder, recurrent, moderate: Secondary | ICD-10-CM | POA: Diagnosis not present

## 2021-12-01 DIAGNOSIS — Z888 Allergy status to other drugs, medicaments and biological substances status: Secondary | ICD-10-CM | POA: Diagnosis not present

## 2021-12-04 DIAGNOSIS — C569 Malignant neoplasm of unspecified ovary: Secondary | ICD-10-CM | POA: Diagnosis not present

## 2021-12-17 DIAGNOSIS — R32 Unspecified urinary incontinence: Secondary | ICD-10-CM | POA: Diagnosis not present

## 2021-12-17 DIAGNOSIS — K5900 Constipation, unspecified: Secondary | ICD-10-CM | POA: Diagnosis not present

## 2021-12-17 DIAGNOSIS — Z85828 Personal history of other malignant neoplasm of skin: Secondary | ICD-10-CM | POA: Diagnosis not present

## 2021-12-17 DIAGNOSIS — K56609 Unspecified intestinal obstruction, unspecified as to partial versus complete obstruction: Secondary | ICD-10-CM | POA: Diagnosis not present

## 2021-12-17 DIAGNOSIS — Z515 Encounter for palliative care: Secondary | ICD-10-CM | POA: Diagnosis not present

## 2021-12-17 DIAGNOSIS — K219 Gastro-esophageal reflux disease without esophagitis: Secondary | ICD-10-CM | POA: Diagnosis not present

## 2021-12-17 DIAGNOSIS — F411 Generalized anxiety disorder: Secondary | ICD-10-CM | POA: Diagnosis not present

## 2021-12-17 DIAGNOSIS — Z9181 History of falling: Secondary | ICD-10-CM | POA: Diagnosis not present

## 2021-12-17 DIAGNOSIS — F329 Major depressive disorder, single episode, unspecified: Secondary | ICD-10-CM | POA: Diagnosis not present

## 2021-12-17 DIAGNOSIS — R159 Full incontinence of feces: Secondary | ICD-10-CM | POA: Diagnosis not present

## 2021-12-17 DIAGNOSIS — C569 Malignant neoplasm of unspecified ovary: Secondary | ICD-10-CM | POA: Diagnosis not present

## 2021-12-18 DIAGNOSIS — R159 Full incontinence of feces: Secondary | ICD-10-CM | POA: Diagnosis not present

## 2021-12-18 DIAGNOSIS — F411 Generalized anxiety disorder: Secondary | ICD-10-CM | POA: Diagnosis not present

## 2021-12-18 DIAGNOSIS — R32 Unspecified urinary incontinence: Secondary | ICD-10-CM | POA: Diagnosis not present

## 2021-12-18 DIAGNOSIS — F329 Major depressive disorder, single episode, unspecified: Secondary | ICD-10-CM | POA: Diagnosis not present

## 2021-12-18 DIAGNOSIS — C569 Malignant neoplasm of unspecified ovary: Secondary | ICD-10-CM | POA: Diagnosis not present

## 2021-12-18 DIAGNOSIS — K219 Gastro-esophageal reflux disease without esophagitis: Secondary | ICD-10-CM | POA: Diagnosis not present

## 2021-12-19 DIAGNOSIS — C569 Malignant neoplasm of unspecified ovary: Secondary | ICD-10-CM | POA: Diagnosis not present

## 2021-12-19 DIAGNOSIS — R32 Unspecified urinary incontinence: Secondary | ICD-10-CM | POA: Diagnosis not present

## 2021-12-19 DIAGNOSIS — K219 Gastro-esophageal reflux disease without esophagitis: Secondary | ICD-10-CM | POA: Diagnosis not present

## 2021-12-19 DIAGNOSIS — R159 Full incontinence of feces: Secondary | ICD-10-CM | POA: Diagnosis not present

## 2021-12-19 DIAGNOSIS — F411 Generalized anxiety disorder: Secondary | ICD-10-CM | POA: Diagnosis not present

## 2021-12-19 DIAGNOSIS — F329 Major depressive disorder, single episode, unspecified: Secondary | ICD-10-CM | POA: Diagnosis not present

## 2021-12-22 DIAGNOSIS — C569 Malignant neoplasm of unspecified ovary: Secondary | ICD-10-CM | POA: Diagnosis not present

## 2021-12-22 DIAGNOSIS — F411 Generalized anxiety disorder: Secondary | ICD-10-CM | POA: Diagnosis not present

## 2021-12-22 DIAGNOSIS — R32 Unspecified urinary incontinence: Secondary | ICD-10-CM | POA: Diagnosis not present

## 2021-12-22 DIAGNOSIS — K219 Gastro-esophageal reflux disease without esophagitis: Secondary | ICD-10-CM | POA: Diagnosis not present

## 2021-12-22 DIAGNOSIS — R159 Full incontinence of feces: Secondary | ICD-10-CM | POA: Diagnosis not present

## 2021-12-22 DIAGNOSIS — F329 Major depressive disorder, single episode, unspecified: Secondary | ICD-10-CM | POA: Diagnosis not present

## 2021-12-24 DIAGNOSIS — R32 Unspecified urinary incontinence: Secondary | ICD-10-CM | POA: Diagnosis not present

## 2021-12-24 DIAGNOSIS — F329 Major depressive disorder, single episode, unspecified: Secondary | ICD-10-CM | POA: Diagnosis not present

## 2021-12-24 DIAGNOSIS — C569 Malignant neoplasm of unspecified ovary: Secondary | ICD-10-CM | POA: Diagnosis not present

## 2021-12-24 DIAGNOSIS — R159 Full incontinence of feces: Secondary | ICD-10-CM | POA: Diagnosis not present

## 2021-12-24 DIAGNOSIS — F411 Generalized anxiety disorder: Secondary | ICD-10-CM | POA: Diagnosis not present

## 2021-12-24 DIAGNOSIS — K219 Gastro-esophageal reflux disease without esophagitis: Secondary | ICD-10-CM | POA: Diagnosis not present

## 2021-12-25 DIAGNOSIS — C569 Malignant neoplasm of unspecified ovary: Secondary | ICD-10-CM | POA: Diagnosis not present

## 2021-12-25 DIAGNOSIS — F411 Generalized anxiety disorder: Secondary | ICD-10-CM | POA: Diagnosis not present

## 2021-12-25 DIAGNOSIS — F329 Major depressive disorder, single episode, unspecified: Secondary | ICD-10-CM | POA: Diagnosis not present

## 2021-12-25 DIAGNOSIS — K219 Gastro-esophageal reflux disease without esophagitis: Secondary | ICD-10-CM | POA: Diagnosis not present

## 2021-12-25 DIAGNOSIS — R159 Full incontinence of feces: Secondary | ICD-10-CM | POA: Diagnosis not present

## 2021-12-25 DIAGNOSIS — R32 Unspecified urinary incontinence: Secondary | ICD-10-CM | POA: Diagnosis not present

## 2021-12-26 DIAGNOSIS — R159 Full incontinence of feces: Secondary | ICD-10-CM | POA: Diagnosis not present

## 2021-12-26 DIAGNOSIS — F329 Major depressive disorder, single episode, unspecified: Secondary | ICD-10-CM | POA: Diagnosis not present

## 2021-12-26 DIAGNOSIS — C569 Malignant neoplasm of unspecified ovary: Secondary | ICD-10-CM | POA: Diagnosis not present

## 2021-12-26 DIAGNOSIS — K219 Gastro-esophageal reflux disease without esophagitis: Secondary | ICD-10-CM | POA: Diagnosis not present

## 2021-12-26 DIAGNOSIS — F411 Generalized anxiety disorder: Secondary | ICD-10-CM | POA: Diagnosis not present

## 2021-12-26 DIAGNOSIS — R32 Unspecified urinary incontinence: Secondary | ICD-10-CM | POA: Diagnosis not present

## 2021-12-27 DIAGNOSIS — K219 Gastro-esophageal reflux disease without esophagitis: Secondary | ICD-10-CM | POA: Diagnosis not present

## 2021-12-27 DIAGNOSIS — F411 Generalized anxiety disorder: Secondary | ICD-10-CM | POA: Diagnosis not present

## 2021-12-27 DIAGNOSIS — C569 Malignant neoplasm of unspecified ovary: Secondary | ICD-10-CM | POA: Diagnosis not present

## 2021-12-27 DIAGNOSIS — R159 Full incontinence of feces: Secondary | ICD-10-CM | POA: Diagnosis not present

## 2021-12-27 DIAGNOSIS — F329 Major depressive disorder, single episode, unspecified: Secondary | ICD-10-CM | POA: Diagnosis not present

## 2021-12-27 DIAGNOSIS — R32 Unspecified urinary incontinence: Secondary | ICD-10-CM | POA: Diagnosis not present

## 2022-01-02 ENCOUNTER — Ambulatory Visit: Payer: Self-pay

## 2022-01-02 NOTE — Patient Outreach (Signed)
  Care Coordination   01/02/2022 Name: [name] MRN: [id number] DOB: 08-20-1945   Care Coordination Outreach Attempts:  An unsuccessful telephone outreach was attempted today to offer the patient information about available care coordination services as a benefit of their health plan.   Follow Up Plan:  Additional outreach attempts will be made to offer the patient care coordination information and services.   Encounter Outcome:  No Answer  Care Coordination Interventions Activated:  No   Care Coordination Interventions:  No, not indicated    Lazaro Arms RN, BSN, [facility]   Phone: [phone]

## 2022-01-07 ENCOUNTER — Ambulatory Visit: Payer: Self-pay

## 2022-01-07 NOTE — Patient Outreach (Signed)
  Care Coordination   01/07/2022 Name: [name] MRN: [id number] DOB: [birth year]   Care Coordination Outreach Attempts:  A second unsuccessful outreach was attempted today to offer the patient with information about available care coordination services as a benefit of their health plan.     Follow Up Plan:  Additional outreach attempts will be made to offer the patient care coordination information and services.   Encounter Outcome:  No Answer  Care Coordination Interventions Activated:  No   Care Coordination Interventions:  No, not indicated    Lazaro Arms RN, BSN, [facility]   Phone: [phone]

## 2022-01-09 DEATH — deceased
